# Patient Record
Sex: Female | Born: 1985
Health system: Southern US, Community
[De-identification: ages and names within clinical notes are randomized; demographics above are authoritative.]

## PROBLEM LIST (undated history)

## (undated) DIAGNOSIS — Z1502 Genetic susceptibility to malignant neoplasm of ovary: Secondary | ICD-10-CM

## (undated) DIAGNOSIS — J45909 Unspecified asthma, uncomplicated: Secondary | ICD-10-CM

## (undated) DIAGNOSIS — M797 Fibromyalgia: Secondary | ICD-10-CM

## (undated) DIAGNOSIS — F32A Depression, unspecified: Secondary | ICD-10-CM

## (undated) DIAGNOSIS — F419 Anxiety disorder, unspecified: Secondary | ICD-10-CM

## (undated) DIAGNOSIS — Z1509 Genetic susceptibility to other malignant neoplasm: Secondary | ICD-10-CM

## (undated) DIAGNOSIS — F329 Major depressive disorder, single episode, unspecified: Secondary | ICD-10-CM

## (undated) DIAGNOSIS — J329 Chronic sinusitis, unspecified: Secondary | ICD-10-CM

## (undated) DIAGNOSIS — Z1501 Genetic susceptibility to malignant neoplasm of breast: Secondary | ICD-10-CM

## (undated) DIAGNOSIS — Z803 Family history of malignant neoplasm of breast: Secondary | ICD-10-CM

## (undated) DIAGNOSIS — G43909 Migraine, unspecified, not intractable, without status migrainosus: Secondary | ICD-10-CM

## (undated) DIAGNOSIS — E282 Polycystic ovarian syndrome: Secondary | ICD-10-CM

## (undated) DIAGNOSIS — O24419 Gestational diabetes mellitus in pregnancy, unspecified control: Secondary | ICD-10-CM

## (undated) HISTORY — DX: Unspecified asthma, uncomplicated: J45.909

## (undated) HISTORY — DX: Genetic susceptibility to malignant neoplasm of breast: Z15.02

## (undated) HISTORY — DX: Genetic susceptibility to malignant neoplasm of breast: Z15.09

## (undated) HISTORY — DX: Family history of malignant neoplasm of breast: Z80.3

## (undated) HISTORY — PX: WISDOM TOOTH EXTRACTION: SHX21

## (undated) HISTORY — DX: Genetic susceptibility to malignant neoplasm of breast: Z15.01

## (undated) HISTORY — DX: Gestational diabetes mellitus in pregnancy, unspecified control: O24.419

---
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
## 2004-02-15 ENCOUNTER — Emergency Department: Payer: Self-pay | Admitting: Emergency Medicine

## 2004-06-03 ENCOUNTER — Ambulatory Visit: Payer: Self-pay | Admitting: Internal Medicine

## 2004-07-02 ENCOUNTER — Ambulatory Visit: Payer: Self-pay | Admitting: Internal Medicine

## 2004-11-17 ENCOUNTER — Ambulatory Visit: Payer: Self-pay | Admitting: Internal Medicine

## 2004-12-02 ENCOUNTER — Ambulatory Visit: Payer: Self-pay | Admitting: Internal Medicine

## 2005-03-25 ENCOUNTER — Emergency Department (HOSPITAL_COMMUNITY): Admission: EM | Admit: 2005-03-25 | Discharge: 2005-03-25 | Payer: Self-pay | Admitting: Emergency Medicine

## 2005-07-01 ENCOUNTER — Ambulatory Visit: Payer: Self-pay | Admitting: Internal Medicine

## 2005-08-10 ENCOUNTER — Ambulatory Visit: Payer: Self-pay | Admitting: Internal Medicine

## 2007-05-04 ENCOUNTER — Ambulatory Visit: Payer: Self-pay | Admitting: Internal Medicine

## 2007-05-04 LAB — CONVERTED CEMR LAB
Bilirubin Urine: NEGATIVE
Blood in Urine, dipstick: NEGATIVE
Glucose, Urine, Semiquant: NEGATIVE
Ketones, urine, test strip: NEGATIVE
Protein, U semiquant: NEGATIVE
Urobilinogen, UA: NEGATIVE
pH: 7.5

## 2007-05-05 ENCOUNTER — Encounter: Payer: Self-pay | Admitting: Internal Medicine

## 2007-05-08 ENCOUNTER — Encounter (INDEPENDENT_AMBULATORY_CARE_PROVIDER_SITE_OTHER): Payer: Self-pay | Admitting: *Deleted

## 2007-07-06 ENCOUNTER — Ambulatory Visit: Payer: Self-pay | Admitting: Internal Medicine

## 2007-07-06 DIAGNOSIS — J309 Allergic rhinitis, unspecified: Secondary | ICD-10-CM | POA: Insufficient documentation

## 2007-07-06 DIAGNOSIS — F411 Generalized anxiety disorder: Secondary | ICD-10-CM

## 2007-07-06 DIAGNOSIS — F419 Anxiety disorder, unspecified: Secondary | ICD-10-CM | POA: Insufficient documentation

## 2007-07-25 ENCOUNTER — Encounter: Payer: Self-pay | Admitting: Internal Medicine

## 2007-08-17 ENCOUNTER — Encounter: Payer: Self-pay | Admitting: Internal Medicine

## 2007-10-09 ENCOUNTER — Ambulatory Visit (HOSPITAL_COMMUNITY): Admission: RE | Admit: 2007-10-09 | Discharge: 2007-10-09 | Payer: Self-pay | Admitting: Obstetrics & Gynecology

## 2007-12-19 ENCOUNTER — Ambulatory Visit: Payer: Self-pay | Admitting: Internal Medicine

## 2007-12-19 DIAGNOSIS — G43909 Migraine, unspecified, not intractable, without status migrainosus: Secondary | ICD-10-CM | POA: Insufficient documentation

## 2008-01-17 ENCOUNTER — Encounter: Payer: Self-pay | Admitting: Internal Medicine

## 2008-02-26 ENCOUNTER — Ambulatory Visit: Payer: Self-pay | Admitting: Internal Medicine

## 2008-04-09 ENCOUNTER — Telehealth (INDEPENDENT_AMBULATORY_CARE_PROVIDER_SITE_OTHER): Payer: Self-pay | Admitting: *Deleted

## 2008-04-09 ENCOUNTER — Ambulatory Visit: Payer: Self-pay | Admitting: Internal Medicine

## 2008-04-11 LAB — CONVERTED CEMR LAB
Free T4: 0.7 ng/dL (ref 0.6–1.6)
TSH: 1.96 microintl units/mL (ref 0.35–5.50)

## 2008-04-14 ENCOUNTER — Encounter (INDEPENDENT_AMBULATORY_CARE_PROVIDER_SITE_OTHER): Payer: Self-pay | Admitting: *Deleted

## 2008-04-14 ENCOUNTER — Telehealth (INDEPENDENT_AMBULATORY_CARE_PROVIDER_SITE_OTHER): Payer: Self-pay | Admitting: *Deleted

## 2008-06-20 ENCOUNTER — Ambulatory Visit: Payer: Self-pay | Admitting: Vascular Surgery

## 2008-06-20 ENCOUNTER — Encounter (INDEPENDENT_AMBULATORY_CARE_PROVIDER_SITE_OTHER): Payer: Self-pay | Admitting: Rheumatology

## 2008-06-20 ENCOUNTER — Encounter: Payer: Self-pay | Admitting: Internal Medicine

## 2008-06-20 ENCOUNTER — Ambulatory Visit: Admission: RE | Admit: 2008-06-20 | Discharge: 2008-06-20 | Payer: Self-pay | Admitting: Rheumatology

## 2008-06-25 ENCOUNTER — Ambulatory Visit (HOSPITAL_COMMUNITY): Admission: RE | Admit: 2008-06-25 | Discharge: 2008-06-25 | Payer: Self-pay | Admitting: Interventional Radiology

## 2008-06-28 ENCOUNTER — Ambulatory Visit (HOSPITAL_COMMUNITY): Admission: RE | Admit: 2008-06-28 | Discharge: 2008-06-28 | Payer: Self-pay | Admitting: Orthopedic Surgery

## 2008-08-01 ENCOUNTER — Encounter: Payer: Self-pay | Admitting: Internal Medicine

## 2008-08-06 ENCOUNTER — Telehealth (INDEPENDENT_AMBULATORY_CARE_PROVIDER_SITE_OTHER): Payer: Self-pay | Admitting: *Deleted

## 2008-08-08 ENCOUNTER — Telehealth: Payer: Self-pay | Admitting: Internal Medicine

## 2008-08-12 ENCOUNTER — Encounter: Payer: Self-pay | Admitting: Internal Medicine

## 2008-08-13 ENCOUNTER — Encounter: Payer: Self-pay | Admitting: Internal Medicine

## 2008-08-20 ENCOUNTER — Telehealth (INDEPENDENT_AMBULATORY_CARE_PROVIDER_SITE_OTHER): Payer: Self-pay | Admitting: *Deleted

## 2008-12-10 ENCOUNTER — Ambulatory Visit (HOSPITAL_COMMUNITY): Admission: RE | Admit: 2008-12-10 | Discharge: 2008-12-10 | Payer: Self-pay | Admitting: Obstetrics & Gynecology

## 2009-01-07 ENCOUNTER — Ambulatory Visit (HOSPITAL_COMMUNITY): Admission: RE | Admit: 2009-01-07 | Discharge: 2009-01-07 | Payer: Self-pay | Admitting: Obstetrics & Gynecology

## 2009-01-22 ENCOUNTER — Ambulatory Visit (HOSPITAL_COMMUNITY): Admission: RE | Admit: 2009-01-22 | Discharge: 2009-01-22 | Payer: Self-pay | Admitting: Obstetrics & Gynecology

## 2009-02-09 ENCOUNTER — Encounter (INDEPENDENT_AMBULATORY_CARE_PROVIDER_SITE_OTHER): Payer: Self-pay | Admitting: *Deleted

## 2009-02-09 ENCOUNTER — Ambulatory Visit: Payer: Self-pay | Admitting: Internal Medicine

## 2009-04-09 ENCOUNTER — Ambulatory Visit (HOSPITAL_COMMUNITY): Admission: RE | Admit: 2009-04-09 | Discharge: 2009-04-09 | Payer: Self-pay | Admitting: Obstetrics & Gynecology

## 2009-04-09 ENCOUNTER — Encounter: Admission: RE | Admit: 2009-04-09 | Discharge: 2009-04-09 | Payer: Self-pay | Admitting: Internal Medicine

## 2009-04-20 ENCOUNTER — Ambulatory Visit (HOSPITAL_COMMUNITY): Admission: RE | Admit: 2009-04-20 | Discharge: 2009-04-20 | Payer: Self-pay | Admitting: Obstetrics & Gynecology

## 2009-05-27 ENCOUNTER — Ambulatory Visit (HOSPITAL_COMMUNITY): Admission: RE | Admit: 2009-05-27 | Discharge: 2009-05-27 | Payer: Self-pay | Admitting: Obstetrics & Gynecology

## 2009-06-14 ENCOUNTER — Inpatient Hospital Stay (HOSPITAL_COMMUNITY): Admission: AD | Admit: 2009-06-14 | Discharge: 2009-06-16 | Payer: Self-pay | Admitting: Obstetrics & Gynecology

## 2009-08-07 ENCOUNTER — Encounter: Payer: Self-pay | Admitting: Internal Medicine

## 2009-11-11 ENCOUNTER — Ambulatory Visit (HOSPITAL_COMMUNITY): Admission: RE | Admit: 2009-11-11 | Discharge: 2009-11-11 | Payer: Self-pay | Admitting: Obstetrics & Gynecology

## 2009-12-02 ENCOUNTER — Ambulatory Visit (HOSPITAL_COMMUNITY): Admission: RE | Admit: 2009-12-02 | Discharge: 2009-12-02 | Payer: Self-pay | Admitting: Obstetrics & Gynecology

## 2009-12-16 ENCOUNTER — Ambulatory Visit (HOSPITAL_COMMUNITY): Admission: RE | Admit: 2009-12-16 | Discharge: 2009-12-16 | Payer: Self-pay | Admitting: Obstetrics & Gynecology

## 2010-01-21 ENCOUNTER — Encounter: Payer: Self-pay | Admitting: Internal Medicine

## 2010-01-25 ENCOUNTER — Telehealth: Payer: Self-pay | Admitting: Internal Medicine

## 2010-01-27 ENCOUNTER — Ambulatory Visit: Payer: Self-pay | Admitting: Internal Medicine

## 2010-01-27 DIAGNOSIS — R002 Palpitations: Secondary | ICD-10-CM | POA: Insufficient documentation

## 2010-02-09 ENCOUNTER — Ambulatory Visit (HOSPITAL_COMMUNITY)
Admission: RE | Admit: 2010-02-09 | Discharge: 2010-02-09 | Payer: Self-pay | Source: Home / Self Care | Attending: Obstetrics & Gynecology | Admitting: Obstetrics & Gynecology

## 2010-03-26 ENCOUNTER — Ambulatory Visit (HOSPITAL_COMMUNITY)
Admission: RE | Admit: 2010-03-26 | Discharge: 2010-03-26 | Payer: Self-pay | Source: Home / Self Care | Attending: Obstetrics & Gynecology | Admitting: Obstetrics & Gynecology

## 2010-03-27 ENCOUNTER — Other Ambulatory Visit (HOSPITAL_COMMUNITY): Payer: Self-pay | Admitting: *Deleted

## 2010-03-27 DIAGNOSIS — Z0489 Encounter for examination and observation for other specified reasons: Secondary | ICD-10-CM

## 2010-03-29 ENCOUNTER — Encounter: Payer: Self-pay | Admitting: Obstetrics & Gynecology

## 2010-04-06 NOTE — Assessment & Plan Note (Signed)
Summary: HEART PALPITATIONS/PT IS PREGNANT/KB   Vital Signs:  Patient profile:   25 year old female Height:      62 inches Weight:      154.4 pounds BMI:     28.34 O2 Sat:      95 % Temp:     98.8 degrees F oral Pulse rate:   88 / minute Resp:     15 per minute BP sitting:   110 / 68  (left arm) Cuff size:   large  Vitals Entered By: Shonna Chock CMA (January 27, 2010 4:22 PM) CC: Heart palpitations daily x 2 weeks except yesterday they finally stopped , Palpitations   CC:  Heart palpitations daily x 2 weeks except yesterday they finally stopped  and Palpitations.  History of Present Illness: Palpitations      This is a 25 year old woman who presents with Palpitations x 2 weeks, initially only @ night.  The patient denies dizziness, syncope, chest pain, shortness of breath, and throat tightness.  The patient denies the following symptoms: blurred vision, numbness, weakness, diaphoresis, and nausea.  The palpitations are described as a sensation of the heart beating strongly.  The palpitations are sudden in onset, intermittent, occur daily, occur at rest & never with  exercise. For past week these now  occur during the day as well  as   at night.  The palpitations  have no trigger. She drinks , 12 of of caffeinated soda / day.EKG 11/17 : WNL.HCT 35.7;glucose 100. Free T4 ; TSH ; Ca++& K+ are all  WNL.  Current Medications (verified): 1)  Prenatal Multivit-Iron  Tabs (Prenatal Vit-Fe Sulfate-Fa) .Marland Kitchen.. 1 By Mouth Once Daily 2)  Claritin 10 Mg Tabs (Loratadine) .Marland Kitchen.. 1 By Mouth Once Daily 3)  Verapamil Hcl Cr 240 Mg Cr-Tabs (Verapamil Hcl) .Marland Kitchen.. 1 By Mouth Once Daily 4)  Calcium-Vitamin D 500-200 Mg-Unit Tabs (Calcium-Vitamin D) .Marland Kitchen.. 1 By Mouth Once Daily 5)  Glyburide 2.5 Mg Tabs (Glyburide) .Marland Kitchen.. 1 By Mouth At Bedtime  Allergies (verified): No Known Drug Allergies  Past History:  Past Medical History:   ANXIETY   ALLERGIC RHINITIS   Headache,-migraine Endometriosis Gestational   Diabetes  Past Surgical History: G 1 P 1; [redacted] weeks pregnant  Review of Systems GI:  Complains of indigestion; denies gas, loss of appetite, and nausea; She took meds with last pregnancy  from Dr Tamela Oddi. Psych:  Denies anxiety, easily tearful, irritability, and mental problems.  Physical Exam  General:  well-nourished,in no acute distress; alert,appropriate and cooperative throughout examination Eyes:  No corneal or conjunctival inflammation noted.  Perrla. No lid lag Neck:  No deformities, masses, or tenderness noted. Thyroid fullw/o nodules Lungs:  Normal respiratory effort, chest expands symmetrically. Lungs are clear to auscultation, no crackles or wheezes. Heart:  normal rate, regular rhythm, no gallop, no rub, no JVD, no HJR, and grade 1 /6 systolic murmur.   Abdomen:  Bowel sounds positive,abdomen soft and non-tender without  organomegaly or hernias noted. IUP  5 cm above umbilicus Pulses:  R and L carotid,radial,dorsalis pedis and posterior tibial pulses are full and equal bilaterally Extremities:  No clubbing, cyanosis, edema. No onycholysis Neurologic:  alert & oriented X3 and DTRs symmetrical and normal.  No tremor Skin:  Intact without suspicious lesions or rashes Cervical Nodes:  No lymphadenopathy noted Axillary Nodes:  No palpable lymphadenopathy Psych:  memory intact for recent and remote, normally interactive, good eye contact, and not anxious appearing.  Impression & Recommendations:  Problem # 1:  PALPITATIONS (ICD-785.1) non exertional; probable triggers: Hiatal Hernia, ERD, caffeine  Problem # 2:  INTRAUTERINE PREGNANCY (ICD-V22.2) 23 weeks  Complete Medication List: 1)  Prenatal Multivit-iron Tabs (Prenatal vit-fe sulfate-fa) .Marland Kitchen.. 1 by mouth once daily 2)  Claritin 10 Mg Tabs (Loratadine) .Marland Kitchen.. 1 by mouth once daily 3)  Verapamil Hcl Cr 240 Mg Cr-tabs (Verapamil hcl) .Marland Kitchen.. 1 by mouth once daily 4)  Calcium-vitamin D 500-200 Mg-unit Tabs  (Calcium-vitamin d) .Marland Kitchen.. 1 by mouth once daily 5)  Glyburide 2.5 Mg Tabs (Glyburide) .Marland Kitchen.. 1 by mouth at bedtime  Patient Instructions: 1)  Avoid all  stimulants as discussed. 2)  Avoid foods high in acid (tomatoes, citrus juices, spicy foods). Avoid eating within two hours of lying down or before exercising. Do not over eat; try smaller more frequent meals. Elevate head of bed twelve inches when sleeping if needed. Ask Dr Jean RosenthalChristell Constant for med for indigestion.  If palpitations persist please have magnesium level  drawn to complement the thorough evaluation to date.   Orders Added: 1)  Est. Patient Level IV [16109]

## 2010-04-06 NOTE — Letter (Signed)
Summary: Prenatal Flow Sheet/[facility]  Prenatal Flow Sheet/[facility]   Imported By: Lanelle Bal 02/05/2010 10:09:39  _____________________________________________________________________  External Attachment:    Type:   Image     Comment:   External Document

## 2010-04-06 NOTE — Letter (Signed)
Summary: [facility]  [facility]   Imported By: Lanelle Bal 08/21/2009 11:09:47  _____________________________________________________________________  External Attachment:    Type:   Image     Comment:   External Document

## 2010-04-06 NOTE — Progress Notes (Signed)
Summary: Heart Palpittations/Appt  Phone Note Other Incoming   Summary of Call: The office has received notes on the patient stating that she has been having heart palpitations. The patient currently has appt for 02-10-10. This does not need to wait, I called patient to schedule sooner appt. She declined appt for today stating that she had to work, patient was schedule for Wed. PM per her request. Initial call taken by: Lucious Groves CMA,  January 25, 2010 11:02 AM

## 2010-04-23 ENCOUNTER — Ambulatory Visit (HOSPITAL_COMMUNITY)
Admission: RE | Admit: 2010-04-23 | Discharge: 2010-04-23 | Disposition: A | Discharge status: Home / Self Care | Payer: 59 | Source: Ambulatory Visit | Attending: Obstetrics & Gynecology | Admitting: Obstetrics & Gynecology

## 2010-04-23 DIAGNOSIS — O99810 Abnormal glucose complicating pregnancy: Secondary | ICD-10-CM | POA: Insufficient documentation

## 2010-04-23 DIAGNOSIS — Z0489 Encounter for examination and observation for other specified reasons: Secondary | ICD-10-CM

## 2010-04-23 DIAGNOSIS — Z3689 Encounter for other specified antenatal screening: Secondary | ICD-10-CM | POA: Insufficient documentation

## 2010-05-04 ENCOUNTER — Inpatient Hospital Stay (HOSPITAL_COMMUNITY)
Admission: AD | Admit: 2010-05-04 | Discharge: 2010-05-05 | Disposition: A | Discharge status: Home / Self Care | Payer: 59 | Source: Ambulatory Visit | Attending: Obstetrics | Admitting: Obstetrics

## 2010-05-04 DIAGNOSIS — G43909 Migraine, unspecified, not intractable, without status migrainosus: Secondary | ICD-10-CM | POA: Insufficient documentation

## 2010-05-04 DIAGNOSIS — O99891 Other specified diseases and conditions complicating pregnancy: Secondary | ICD-10-CM | POA: Insufficient documentation

## 2010-05-04 DIAGNOSIS — O9989 Other specified diseases and conditions complicating pregnancy, childbirth and the puerperium: Secondary | ICD-10-CM

## 2010-05-10 ENCOUNTER — Inpatient Hospital Stay (HOSPITAL_COMMUNITY)
Admission: AD | Admit: 2010-05-10 | Discharge: 2010-05-15 | DRG: 775 | Disposition: A | Discharge status: Home / Self Care | Payer: 59 | Source: Ambulatory Visit | Attending: Obstetrics & Gynecology | Admitting: Obstetrics & Gynecology

## 2010-05-10 DIAGNOSIS — O99814 Abnormal glucose complicating childbirth: Principal | ICD-10-CM | POA: Diagnosis present

## 2010-05-10 DIAGNOSIS — O212 Late vomiting of pregnancy: Secondary | ICD-10-CM | POA: Diagnosis present

## 2010-05-10 LAB — COMPREHENSIVE METABOLIC PANEL
ALT: 17 U/L (ref 0–35)
Albumin: 2.4 g/dL — ABNORMAL LOW (ref 3.5–5.2)
Alkaline Phosphatase: 168 U/L — ABNORMAL HIGH (ref 39–117)
Calcium: 8.5 mg/dL (ref 8.4–10.5)
GFR calc Af Amer: >60 mL/min (ref >60)
Potassium: 3.7 mEq/L (ref 3.5–5.1)
Sodium: 140 mEq/L (ref 135–145)
Total Protein: 5.5 g/dL — ABNORMAL LOW (ref 6.0–8.3)

## 2010-05-10 LAB — CBC
MCHC: 32.9 g/dL (ref 30.0–36.0)
Platelets: 166 10*3/uL (ref 150–400)
RDW: 14.5 % (ref 11.5–15.5)
WBC: 7.9 10*3/uL (ref 4.0–10.5)

## 2010-05-10 LAB — GLUCOSE, CAPILLARY
Glucose-Capillary: 90 mg/dL (ref 70–99)
Glucose-Capillary: 140 mg/dL — ABNORMAL HIGH (ref 70–99)

## 2010-05-10 LAB — URIC ACID: Uric Acid, Serum: 6.1 mg/dL (ref 2.4–7.0)

## 2010-05-11 ENCOUNTER — Inpatient Hospital Stay (HOSPITAL_COMMUNITY): Payer: 59

## 2010-05-11 ENCOUNTER — Ambulatory Visit (HOSPITAL_COMMUNITY)
Admit: 2010-05-11 | Discharge: 2010-05-11 | Disposition: A | Discharge status: Home / Self Care | Payer: 59 | Attending: Obstetrics & Gynecology | Admitting: Obstetrics & Gynecology

## 2010-05-11 LAB — GLUCOSE, CAPILLARY: Glucose-Capillary: 119 mg/dL — ABNORMAL HIGH (ref 70–99)

## 2010-05-11 LAB — COMPREHENSIVE METABOLIC PANEL
AST: 28 U/L (ref 0–37)
Albumin: 2 g/dL — ABNORMAL LOW (ref 3.5–5.2)
Alkaline Phosphatase: 142 U/L — ABNORMAL HIGH (ref 39–117)
BUN: 2 mg/dL — ABNORMAL LOW (ref 6–23)
CO2: 24 mEq/L (ref 19–32)
Chloride: 108 mEq/L (ref 96–112)
Creatinine, Ser: 0.86 mg/dL (ref 0.4–1.2)
GFR calc Af Amer: >60 mL/min (ref >60)
GFR calc non Af Amer: >60 mL/min (ref >60)
Potassium: 3.5 mEq/L (ref 3.5–5.1)
Total Bilirubin: 0.5 mg/dL (ref 0.3–1.2)

## 2010-05-11 LAB — TSH: TSH: 1.208 u[IU]/mL (ref 0.350–4.500)

## 2010-05-11 NOTE — H&P (Addendum)
NAMERAMA, SORCI               ACCOUNT NO.:  [id number]  MEDICAL RECORD NO.:  [id number]           PATIENT TYPE:  I  LOCATION:  9151                          FACILITY:  [facility]  PHYSICIAN:  Roseanna Rainbow, M.D.DATE OF BIRTH:  10/30/1985  DATE OF ADMISSION:  05/10/2010 DATE OF DISCHARGE:                             HISTORY & PHYSICAL   CHIEF COMPLAINT:  The patient is a 25 year old gravida 2, para 1, with an estimated date of confinement of March 18, complaining of nausea and vomiting.  HISTORY OF PRESENT ILLNESS:  The patient has a history of mild nausea that resolved in the early second trimester of pregnancy.  Subsequent to that, she developed some GERD symptoms and treatments progressed up to a proton pump inhibitor.  At one point, she was taking the proton pump inhibitor twice a day.  Over the past several weeks, the patient has had worsening nausea and vomiting.  Initially, this was felt to be a viral gastroenteritis.  However, the symptoms persisted.  She was started on an antiemetic regimen.  The nausea and vomiting is refractory to the current antiemetic regimen.  Her p.o. intake is minimal.  There is a possible 5-pound weight loss in the last week, though this may be a spurious finding.  ALLERGIES:  No known drug allergies.  MEDICATIONS:  Glyburide, Protonix, prenatal vitamins, meclizine, Reglan.  OB RISK FACTORS:  Please see the above, gestational diabetes on an oral agent.  PAST OB HISTORY:  In April 2011, she delivered at term 6 pound 1 ounce female, vaginal delivery.  PRENATAL LABS:  Blood type is A+, antibody screen negative.  Urine culture and sensitivity, insignificant growth.  Average glucose 108 July 2011.  Free T4 0.97 November 2011.  GBS positive on February 20. Hepatitis B surface antigen negative.  Hematocrit 38.8, hemoglobin 12.5. HIV nonreactive.  H. pylori IgG negative January 2012.  Hemoglobin A1c 5.07 September 2009.  Platelets 201,000.  RPR  nonreactive.  Rubella immune. TSH 1.4 in November 2011.  Varicella immune.  Most recent ultrasound on February 17, breech presentation, normal amniotic fluid, no previa, estimated fetal weight at the 59th percentile for 35-week-5-day gestation.  Ultrasound on January 20, the estimated fetal weight was at the 53rd percentile for 31-week-5-day gestation.  An ultrasound on December 6 at 25 weeks and 2 days, the estimated fetal weight was at the 47th percentile.  TACE score of 0.  PAST GYN HISTORY:  Noncontributory.  PAST MEDICAL HISTORY: 1. Hypercholesterolemia. 2. Migraine headaches. 3. Gestational diabetes. 4. Fibromyalgia.  PAST SURGICAL HISTORY:  No previous surgery.  SOCIAL HISTORY:  She is a Lawyer.  She is married, living with her spouse. Does not give any significant history of alcohol usage.  Has no significant smoking history.  Denies illicit drug use.  FAMILY HISTORY:  Remarkable for adult-onset diabetes, hypertension.  REVIEW OF SYSTEMS:  GASTROINTESTINAL:  Please see the above.  PHYSICAL EXAMINATION:  VITAL SIGNS:  Blood pressure 127/84, weight 162 pounds.  Nonstress test reactive.  CBG, some lows, per the patient fasting with the recent decrease p.o. intake. GENERAL:  Anxious, tearful. ABDOMEN:  Gravid.  PELVIC EXAM:  Deferred.  LABORATORY DATA:  On March 1, potassium 3.2, albumin 3.3, uric acid 6.2.  ASSESSMENT: 1. Primipara at 38 weeks with nausea and vomiting, likely pregnancy     related. 2. Gestational diabetes, on oral agent. 3. Fetal testing consistent with fetal well being, appropriate for     gestational age fetus.  PLAN:  Admission, IV hydration, replete electrolytes as needed, labs, bedrest, antiemetics, Maternal Fetal Medicine consultation.     Roseanna Rainbow, M.D.     Judee Clara  D:  05/10/2010  T:  05/11/2010  Job:  [id number]  Electronically Signed by Antionette Char M.D. on 05/11/2010 07:43:20 PM

## 2010-05-12 LAB — GLUCOSE, CAPILLARY
Glucose-Capillary: 79 mg/dL (ref 70–99)
Glucose-Capillary: 105 mg/dL — ABNORMAL HIGH (ref 70–99)

## 2010-05-13 LAB — GLUCOSE, CAPILLARY
Glucose-Capillary: 132 mg/dL — ABNORMAL HIGH (ref 70–99)
Glucose-Capillary: 78 mg/dL (ref 70–99)
Glucose-Capillary: 86 mg/dL (ref 70–99)

## 2010-05-13 LAB — CBC
HCT: 35.5 % — ABNORMAL LOW (ref 36.0–46.0)
MCHC: 32.4 g/dL (ref 30.0–36.0)
Platelets: 138 10*3/uL — ABNORMAL LOW (ref 150–400)
RDW: 14.7 % (ref 11.5–15.5)
WBC: 8.3 10*3/uL (ref 4.0–10.5)

## 2010-05-14 LAB — CBC
MCH: 30.6 pg (ref 26.0–34.0)
MCHC: 32.6 g/dL (ref 30.0–36.0)
MCV: 94.1 fL (ref 78.0–100.0)
Platelets: 141 10*3/uL — ABNORMAL LOW (ref 150–400)
RBC: 3.72 MIL/uL — ABNORMAL LOW (ref 3.87–5.11)
RDW: 14.6 % (ref 11.5–15.5)

## 2010-05-14 LAB — GLUCOSE, CAPILLARY
Glucose-Capillary: 100 mg/dL — ABNORMAL HIGH (ref 70–99)
Glucose-Capillary: 96 mg/dL (ref 70–99)

## 2010-05-23 ENCOUNTER — Inpatient Hospital Stay (HOSPITAL_COMMUNITY): Admission: AD | Admit: 2010-05-23 | Payer: Self-pay | Admitting: Obstetrics & Gynecology

## 2010-05-26 LAB — CBC
HCT: 31.9 % — ABNORMAL LOW (ref 36.0–46.0)
Hemoglobin: 13.8 g/dL (ref 12.0–15.0)
MCHC: 34.3 g/dL (ref 30.0–36.0)
Platelets: 182 10*3/uL (ref 150–400)
RDW: 13.9 % (ref 11.5–15.5)
RDW: 13.9 % (ref 11.5–15.5)
WBC: 13.8 10*3/uL — ABNORMAL HIGH (ref 4.0–10.5)

## 2010-05-26 LAB — GLUCOSE, CAPILLARY
Glucose-Capillary: 118 mg/dL — ABNORMAL HIGH (ref 70–99)
Glucose-Capillary: 86 mg/dL (ref 70–99)

## 2010-05-26 LAB — RPR: RPR Ser Ql: NONREACTIVE

## 2010-11-26 ENCOUNTER — Inpatient Hospital Stay (INDEPENDENT_AMBULATORY_CARE_PROVIDER_SITE_OTHER)
Admission: RE | Admit: 2010-11-26 | Discharge: 2010-11-26 | Disposition: A | Discharge status: Home / Self Care | Payer: 59 | Source: Ambulatory Visit | Attending: Family Medicine | Admitting: Family Medicine

## 2010-11-26 DIAGNOSIS — L989 Disorder of the skin and subcutaneous tissue, unspecified: Secondary | ICD-10-CM

## 2010-11-28 ENCOUNTER — Inpatient Hospital Stay (INDEPENDENT_AMBULATORY_CARE_PROVIDER_SITE_OTHER)
Admission: RE | Admit: 2010-11-28 | Discharge: 2010-11-28 | Disposition: A | Discharge status: Home / Self Care | Payer: 59 | Source: Ambulatory Visit | Attending: Family Medicine | Admitting: Family Medicine

## 2010-11-28 DIAGNOSIS — L52 Erythema nodosum: Secondary | ICD-10-CM

## 2010-11-29 LAB — HERPES SIMPLEX VIRUS CULTURE: Culture: NOT DETECTED

## 2010-12-22 ENCOUNTER — Ambulatory Visit (HOSPITAL_COMMUNITY)
Admission: RE | Admit: 2010-12-22 | Discharge: 2010-12-22 | Disposition: A | Discharge status: Home / Self Care | Payer: 59 | Source: Ambulatory Visit | Attending: Rheumatology | Admitting: Rheumatology

## 2010-12-22 ENCOUNTER — Other Ambulatory Visit: Payer: Self-pay | Admitting: Rheumatology

## 2010-12-22 DIAGNOSIS — Z0189 Encounter for other specified special examinations: Secondary | ICD-10-CM | POA: Insufficient documentation

## 2011-10-20 ENCOUNTER — Other Ambulatory Visit: Payer: Self-pay | Admitting: Obstetrics & Gynecology

## 2011-10-20 DIAGNOSIS — R1032 Left lower quadrant pain: Secondary | ICD-10-CM

## 2011-10-21 ENCOUNTER — Ambulatory Visit (HOSPITAL_COMMUNITY)
Admission: RE | Admit: 2011-10-21 | Discharge: 2011-10-21 | Disposition: A | Discharge status: Home / Self Care | Payer: 59 | Source: Ambulatory Visit | Attending: Obstetrics & Gynecology | Admitting: Obstetrics & Gynecology

## 2011-10-21 DIAGNOSIS — N831 Corpus luteum cyst of ovary, unspecified side: Secondary | ICD-10-CM | POA: Insufficient documentation

## 2011-10-21 DIAGNOSIS — R1032 Left lower quadrant pain: Secondary | ICD-10-CM | POA: Insufficient documentation

## 2011-10-21 DIAGNOSIS — Z30431 Encounter for routine checking of intrauterine contraceptive device: Secondary | ICD-10-CM | POA: Insufficient documentation

## 2012-03-09 ENCOUNTER — Ambulatory Visit (INDEPENDENT_AMBULATORY_CARE_PROVIDER_SITE_OTHER): Payer: 59 | Admitting: Family Medicine

## 2012-03-09 VITALS — BP 90/62 | HR 92 | Temp 98.3°F | Wt 143.0 lb

## 2012-03-09 DIAGNOSIS — J329 Chronic sinusitis, unspecified: Secondary | ICD-10-CM | POA: Insufficient documentation

## 2012-03-09 MED ORDER — GUAIFENESIN-CODEINE 100-10 MG/5ML PO SYRP: 10.0000 mL | ORAL_SOLUTION | Freq: Three times a day (TID) | ORAL | Status: DC | PRN

## 2012-03-09 MED ORDER — DOXYCYCLINE HYCLATE 100 MG PO TABS: 100.0000 mg | ORAL_TABLET | Freq: Two times a day (BID) | ORAL | Status: DC

## 2012-03-09 NOTE — Progress Notes (Signed)
  Subjective:    Patient ID: Michele Mcbride, female    DOB: 1985-08-18, 27 y.o.   MRN: [id number]  HPI URI- sxs started 3 days ago.  Both children are sick.  + hoarseness, productive cough, facial pain/pressure.  + HA.  + sore throat, PND.  No fevers.  No N/V/D.     Review of Systems For ROS see HPI     Objective:   Physical Exam  Vitals reviewed. Constitutional: She appears well-developed and well-nourished. No distress.  HENT:  Head: Normocephalic and atraumatic.  Right Ear: Tympanic membrane normal.  Left Ear: Tympanic membrane normal.  Nose: Mucosal edema and rhinorrhea present. Right sinus exhibits maxillary sinus tenderness and frontal sinus tenderness. Left sinus exhibits maxillary sinus tenderness and frontal sinus tenderness.  Mouth/Throat: Uvula is midline and mucous membranes are normal. Posterior oropharyngeal erythema present. No oropharyngeal exudate.  Eyes: Conjunctivae normal and EOM are normal. Pupils are equal, round, and reactive to light.  Neck: Normal range of motion. Neck supple.  Cardiovascular: Normal rate, regular rhythm and normal heart sounds.   Pulmonary/Chest: Effort normal and breath sounds normal. No respiratory distress. She has no wheezes.  Lymphadenopathy:    She has no cervical adenopathy.          Assessment & Plan:

## 2012-03-09 NOTE — Assessment & Plan Note (Signed)
New.  Start abx.  Cough meds prn.  Reviewed supportive care and red flags that should prompt return.  Pt expressed understanding and is in agreement w/ plan.  

## 2012-03-09 NOTE — Patient Instructions (Addendum)
This is a sinus infection Start the Doxy twice daily- take w/ food Drink plenty of fluids REST!!! Use the cough syrup as needed- will cause drowsiness Mucinex DM for daytime cough Call with any questions or concerns Hang in there!!!

## 2012-03-10 ENCOUNTER — Encounter (HOSPITAL_COMMUNITY): Payer: Self-pay | Admitting: Emergency Medicine

## 2012-03-10 ENCOUNTER — Emergency Department (HOSPITAL_COMMUNITY)
Admission: EM | Admit: 2012-03-10 | Discharge: 2012-03-10 | Disposition: A | Discharge status: Home / Self Care | Payer: 59 | Attending: Emergency Medicine | Admitting: Emergency Medicine

## 2012-03-10 DIAGNOSIS — G43909 Migraine, unspecified, not intractable, without status migrainosus: Secondary | ICD-10-CM | POA: Insufficient documentation

## 2012-03-10 DIAGNOSIS — R51 Headache: Secondary | ICD-10-CM

## 2012-03-10 DIAGNOSIS — Z8739 Personal history of other diseases of the musculoskeletal system and connective tissue: Secondary | ICD-10-CM | POA: Insufficient documentation

## 2012-03-10 DIAGNOSIS — Z79899 Other long term (current) drug therapy: Secondary | ICD-10-CM | POA: Insufficient documentation

## 2012-03-10 DIAGNOSIS — Z8742 Personal history of other diseases of the female genital tract: Secondary | ICD-10-CM | POA: Insufficient documentation

## 2012-03-10 DIAGNOSIS — Z8619 Personal history of other infectious and parasitic diseases: Secondary | ICD-10-CM | POA: Insufficient documentation

## 2012-03-10 HISTORY — DX: Migraine, unspecified, not intractable, without status migrainosus: G43.909

## 2012-03-10 HISTORY — DX: Fibromyalgia: M79.7

## 2012-03-10 HISTORY — DX: Chronic sinusitis, unspecified: J32.9

## 2012-03-10 HISTORY — DX: Polycystic ovarian syndrome: E28.2

## 2012-03-10 MED ORDER — HYDROCODONE-ACETAMINOPHEN 5-325 MG PO TABS: 1.0000 | ORAL_TABLET | ORAL | Status: DC | PRN

## 2012-03-10 MED ORDER — METOCLOPRAMIDE HCL 5 MG/ML IJ SOLN
10.0000 mg | Freq: Once | INTRAMUSCULAR | Status: AC
  Administered 2012-03-10: 10 mg via INTRAVENOUS
  Filled 2012-03-10: qty 2

## 2012-03-10 MED ORDER — KETOROLAC TROMETHAMINE 30 MG/ML IJ SOLN
30.0000 mg | Freq: Once | INTRAMUSCULAR | Status: AC
  Administered 2012-03-10: 30 mg via INTRAVENOUS
  Filled 2012-03-10: qty 1

## 2012-03-10 MED ORDER — MORPHINE SULFATE 4 MG/ML IJ SOLN
6.0000 mg | Freq: Once | INTRAMUSCULAR | Status: AC
  Administered 2012-03-10: 6 mg via INTRAVENOUS
  Filled 2012-03-10: qty 2

## 2012-03-10 NOTE — ED Provider Notes (Signed)
History     CSN: [id number]  Arrival date & time 03/10/12  0303   First MD Initiated Contact with Patient 03/10/12 [phone]      Chief Complaint  Patient presents with  . Migraine    The history is provided by the patient.   patient developed ongoing persistent headache today.  She has a history of migraine headaches and states that Imitrex has not helped her headache.  Her headache is generalized her entire head.  No recent fall or trauma.  She is not on anticoagulants.  She's currently being managed symptomatically for a sinus infection with antibiotics by her primary care physician.  She reports trying Imitrex and no other medications for her headache.  Her headache is mild to moderate in severity.  The patient has a history of fibromyalgia per the medical records.  Nausea without vomiting.  No diarrhea.  Mild decreased oral intake today.  Nothing improves her symptoms.  Her symptoms are worsened by light and loud noise.   Past Medical History  Diagnosis Date  . Sinus infection   . Migraine   . Fibromyalgia   . PCOS (polycystic ovarian syndrome)     History reviewed. No pertinent past surgical history.  No family history on file.  History  Substance Use Topics  . Smoking status: Never Smoker   . Smokeless tobacco: Not on file  . Alcohol Use: No    OB History    Grav Para Term Preterm Abortions TAB SAB Ect Mult Living                  Review of Systems  All other systems reviewed and are negative.    Allergies  Review of patient's allergies indicates no known allergies.  Home Medications   Current Outpatient Rx  Name  Route  Sig  Dispense  Refill  . DOXYCYCLINE HYCLATE 100 MG PO TABS   Oral   Take 1 tablet (100 mg total) by mouth 2 (two) times daily.   20 tablet   0   . DULOXETINE HCL 60 MG PO CPEP   Oral   Take 60 mg by mouth daily.         . GUAIFENESIN-CODEINE 100-10 MG/5ML PO SYRP   Oral   Take 10 mLs by mouth 3 (three) times daily as needed for  cough.   240 mL   0   . SPIRONOLACTONE 100 MG PO TABS   Oral   Take 100 mg by mouth daily.         . SUMATRIPTAN SUCCINATE 100 MG PO TABS   Oral   Take 100 mg by mouth every 2 (two) hours as needed.         . TOPIRAMATE 200 MG PO TABS   Oral   Take 200 mg by mouth 2 (two) times daily.         Marland Kitchen ZOLPIDEM TARTRATE 10 MG PO TABS   Oral   Take 10 mg by mouth at bedtime as needed. For sleep         . HYDROCODONE-ACETAMINOPHEN 5-325 MG PO TABS   Oral   Take 1 tablet by mouth every 4 (four) hours as needed for pain.   8 tablet   0     BP 105/75  Pulse 117  Temp 97.8 F (36.6 C) (Oral)  Resp 23  SpO2 97%  Physical Exam  Nursing note and vitals reviewed. Constitutional: She is oriented to person, place, and time. She appears  well-developed and well-nourished. No distress.  HENT:  Head: Normocephalic and atraumatic.  Eyes: EOM are normal. Pupils are equal, round, and reactive to light.  Neck: Normal range of motion.  Cardiovascular: Normal rate, regular rhythm and normal heart sounds.   Pulmonary/Chest: Effort normal and breath sounds normal.  Abdominal: Soft. She exhibits no distension. There is no tenderness.  Musculoskeletal: Normal range of motion.  Neurological: She is alert and oriented to person, place, and time.       5/5 strength in major muscle groups of  bilateral upper and lower extremities. Speech normal. No facial asymetry.   Skin: Skin is warm and dry.  Psychiatric: She has a normal mood and affect. Judgment normal.    ED Course  Procedures (including critical care time)  Labs Reviewed - No data to display No results found.   1. Headache       MDM  Typical migraine headache for the pt. Non focal neuro exam. No recent head trauma. No fever. Doubt meningitis. Doubt intracranial bleed. Doubt normal pressure hydrocephalus. No indication for imaging. Will treat with migraine cocktail and reevaluate  5:09 AM The patient is feeling better at  this time.  Discharge home in good condition.        Lyanne Co, MD 03/10/12 [phone]

## 2012-03-10 NOTE — ED Notes (Signed)
Pt dc to home.  Pt states understanding to dc paperwork.   

## 2012-03-10 NOTE — ED Notes (Signed)
Pt ambulatory to exit without difficulty.  Pt denies need for w/c.

## 2012-03-10 NOTE — ED Notes (Signed)
C/o migraine since Friday morning with nausea.  Pt states she is currently taking antibiotics for a sinus infection.

## 2012-06-01 IMAGING — US US OB FOLLOW-UP
1 series · 14 of 28 positions shown · non-contrast
Comparison: none

[Series 1: us ob follow-up · 14 of 32 slices shown]
[im 2/32]
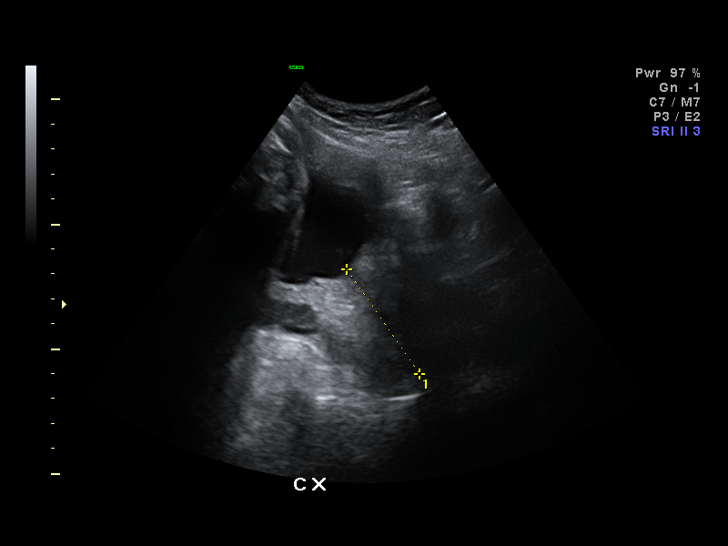
[im 4/32]
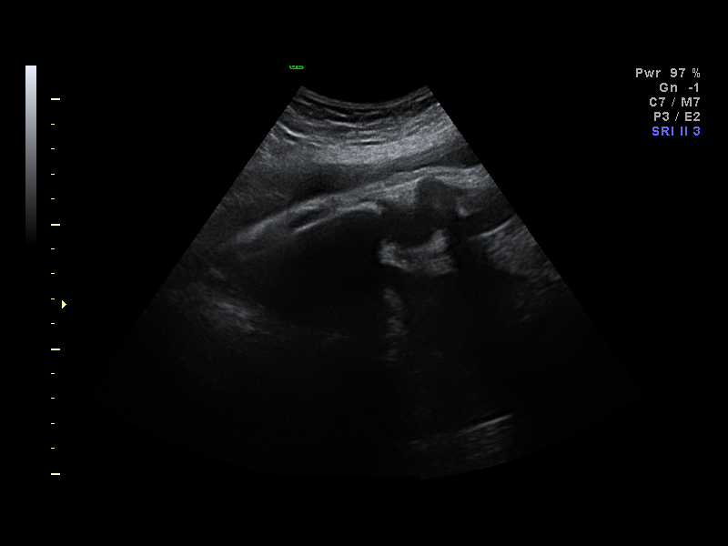
[im 6/32]
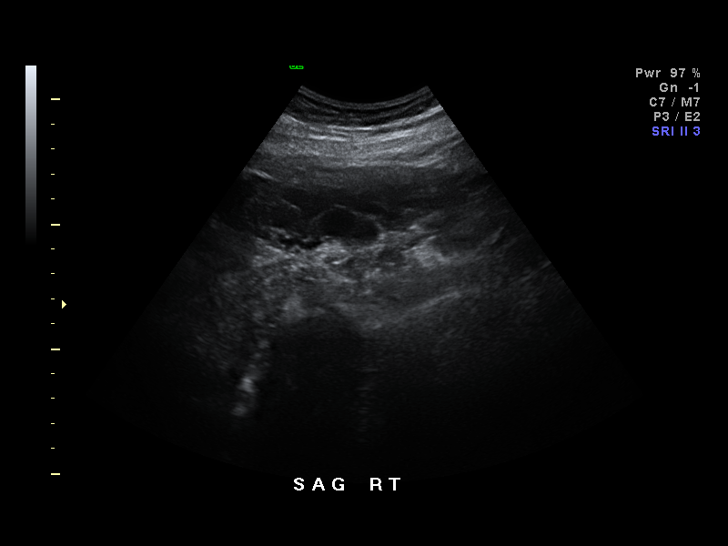
[im 9/32]
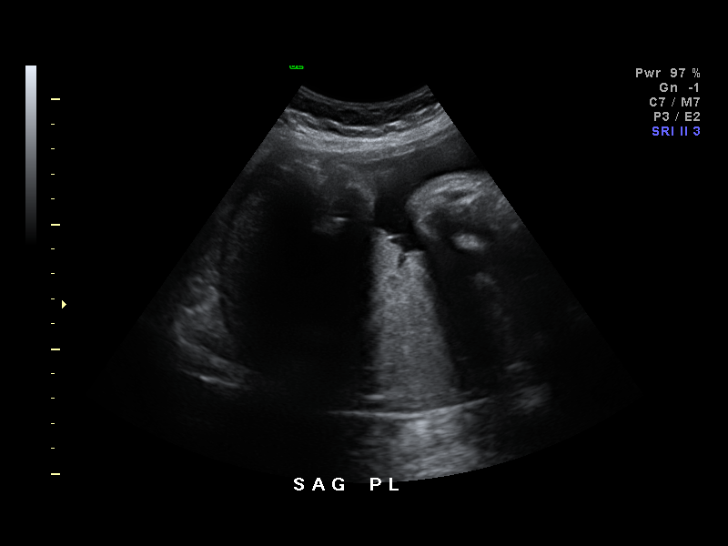
[im 11/32]
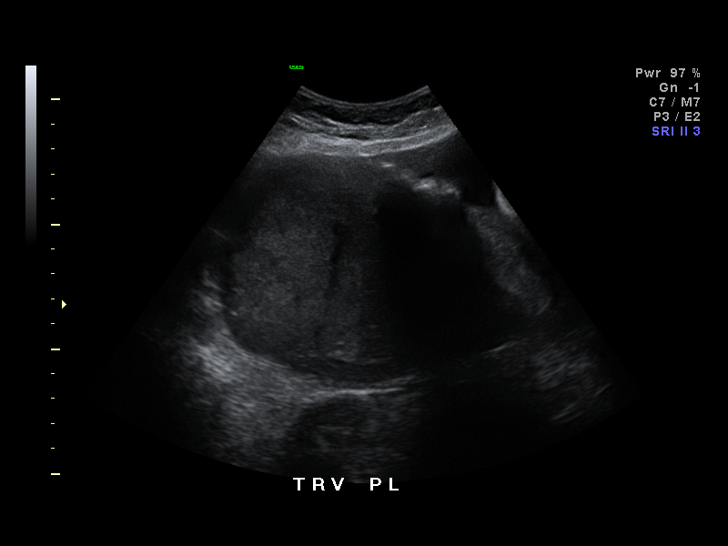
[im 13/32]
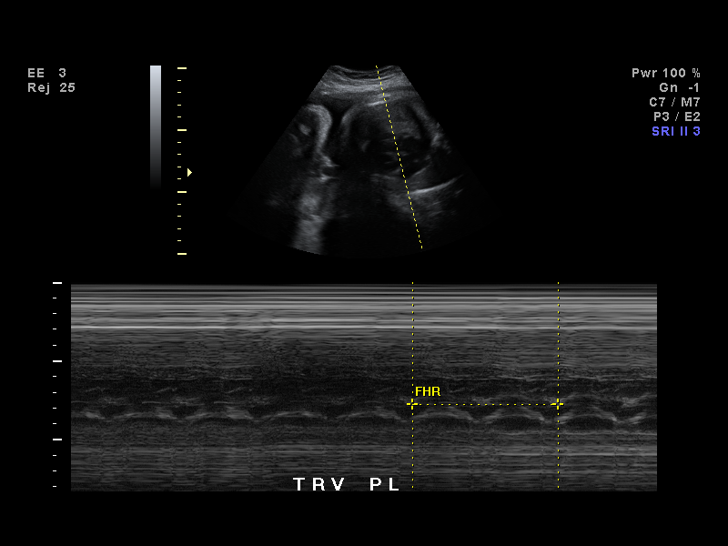
[im 15/32]
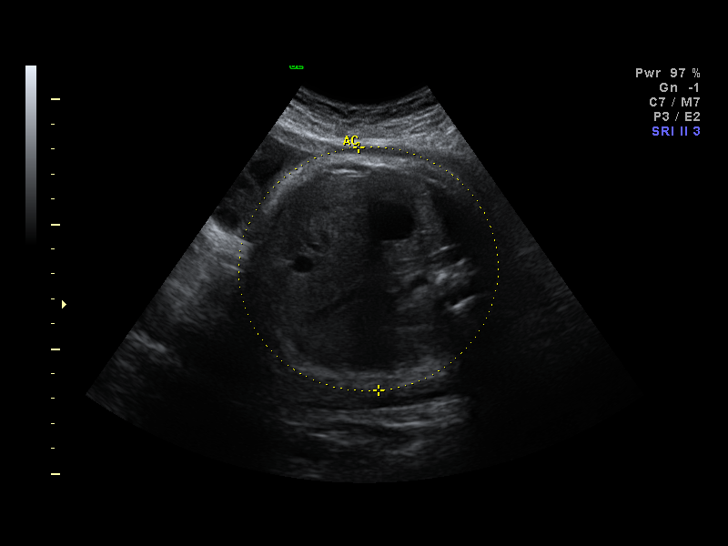
[im 18/32]
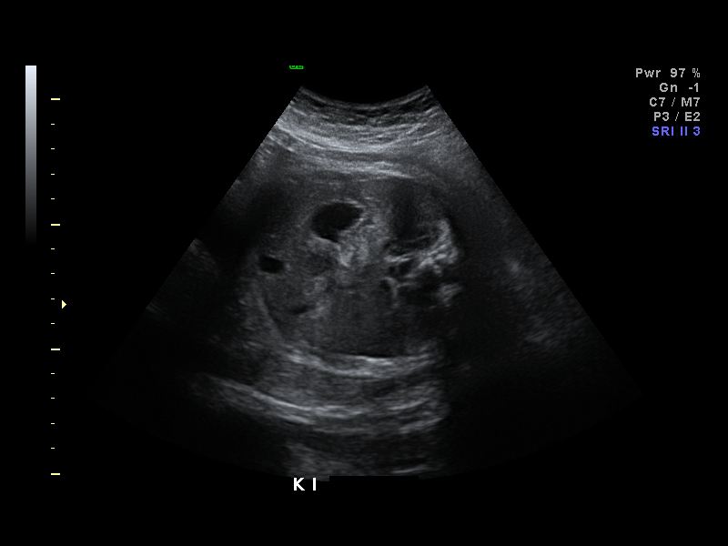
[im 20/32]
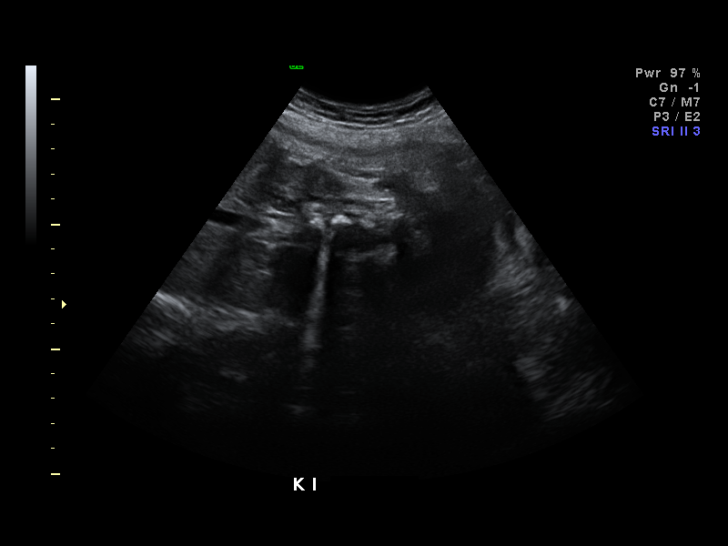
[im 22/32]
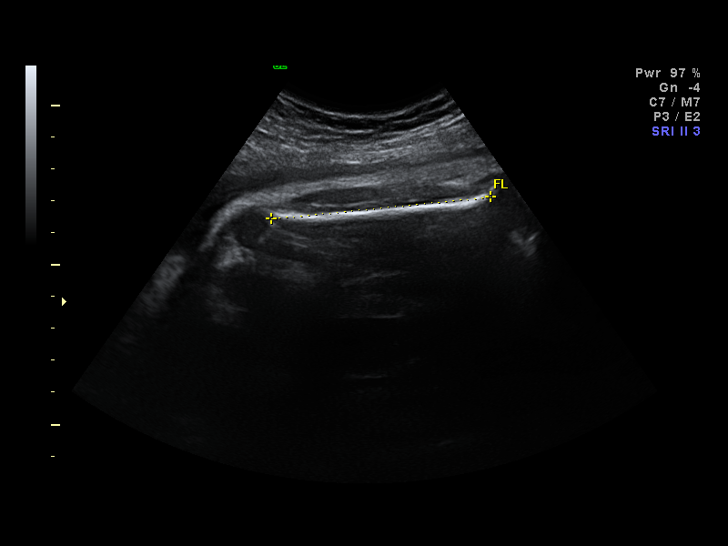
[im 25/32]
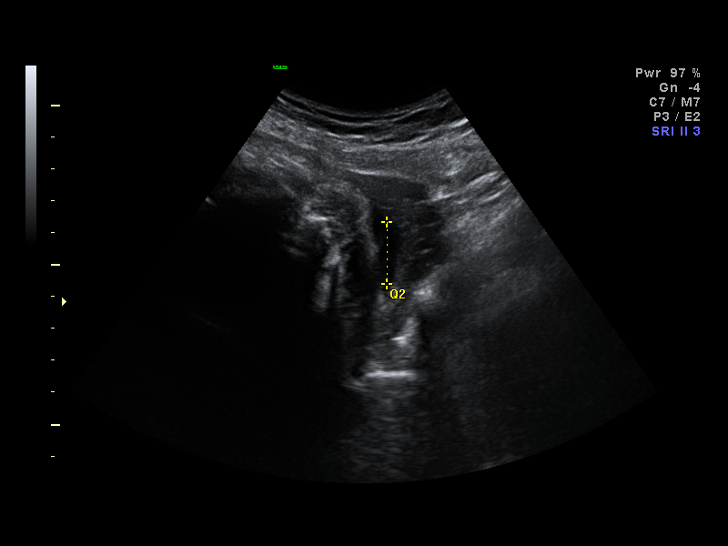
[im 27/32]
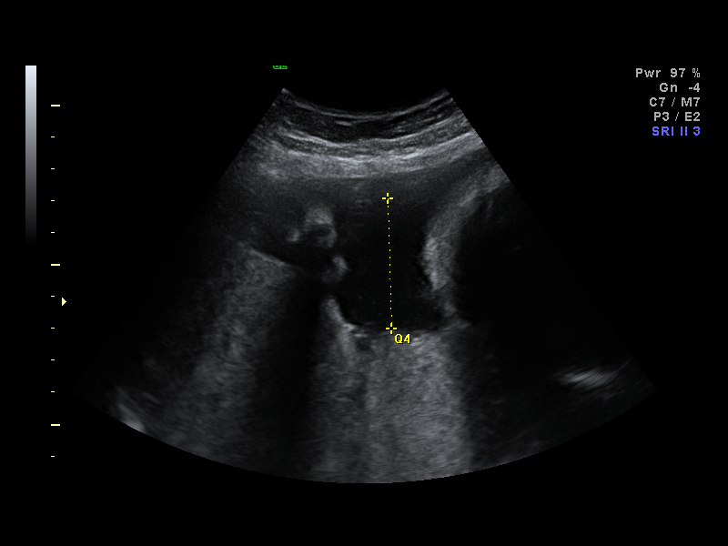
[im 29/32]
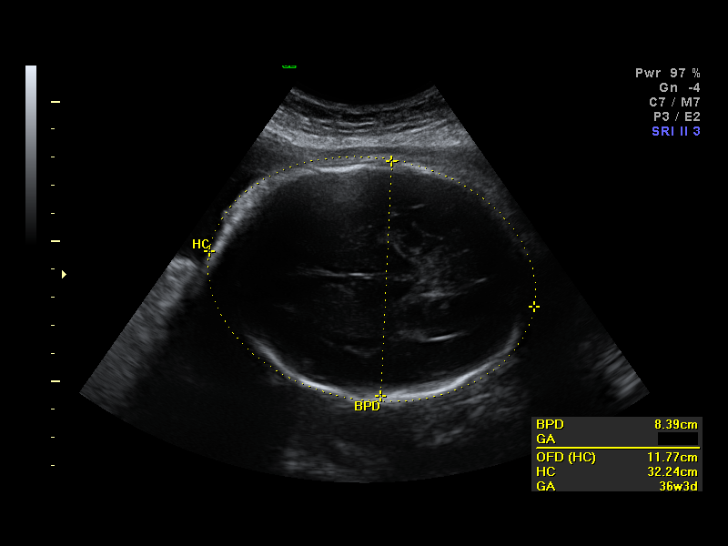
[im 32/32]
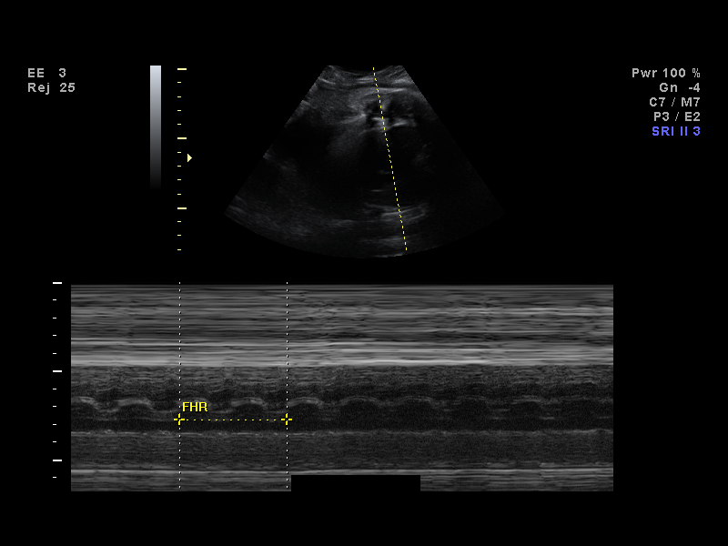

[14 of 28 positions shown; findings below may reference images not displayed]

Canned report from images found in remote index.

Refer to host system for actual result text.

## 2012-09-05 ENCOUNTER — Other Ambulatory Visit: Payer: Self-pay | Admitting: *Deleted

## 2012-09-05 ENCOUNTER — Other Ambulatory Visit: Payer: 59

## 2012-09-05 DIAGNOSIS — A64 Unspecified sexually transmitted disease: Secondary | ICD-10-CM

## 2012-09-06 LAB — WET PREP BY MOLECULAR PROBE: Trichomonas vaginosis: NEGATIVE

## 2012-09-06 LAB — HIV ANTIBODY (ROUTINE TESTING W REFLEX): HIV: NONREACTIVE

## 2012-09-06 LAB — GC/CHLAMYDIA PROBE AMP: GC Probe RNA: NEGATIVE

## 2012-09-06 LAB — RPR

## 2012-09-09 ENCOUNTER — Emergency Department (HOSPITAL_COMMUNITY)
Admission: EM | Admit: 2012-09-09 | Discharge: 2012-09-09 | Disposition: A | Discharge status: Home / Self Care | Payer: 59 | Attending: Emergency Medicine | Admitting: Emergency Medicine

## 2012-09-09 ENCOUNTER — Emergency Department (HOSPITAL_COMMUNITY): Payer: 59

## 2012-09-09 ENCOUNTER — Encounter (HOSPITAL_COMMUNITY): Payer: Self-pay | Admitting: *Deleted

## 2012-09-09 DIAGNOSIS — Z8639 Personal history of other endocrine, nutritional and metabolic disease: Secondary | ICD-10-CM | POA: Insufficient documentation

## 2012-09-09 DIAGNOSIS — R300 Dysuria: Secondary | ICD-10-CM | POA: Insufficient documentation

## 2012-09-09 DIAGNOSIS — N200 Calculus of kidney: Secondary | ICD-10-CM | POA: Insufficient documentation

## 2012-09-09 DIAGNOSIS — Z862 Personal history of diseases of the blood and blood-forming organs and certain disorders involving the immune mechanism: Secondary | ICD-10-CM | POA: Insufficient documentation

## 2012-09-09 DIAGNOSIS — Z3202 Encounter for pregnancy test, result negative: Secondary | ICD-10-CM | POA: Insufficient documentation

## 2012-09-09 DIAGNOSIS — G43909 Migraine, unspecified, not intractable, without status migrainosus: Secondary | ICD-10-CM | POA: Insufficient documentation

## 2012-09-09 DIAGNOSIS — Z79899 Other long term (current) drug therapy: Secondary | ICD-10-CM | POA: Insufficient documentation

## 2012-09-09 DIAGNOSIS — Z8739 Personal history of other diseases of the musculoskeletal system and connective tissue: Secondary | ICD-10-CM | POA: Insufficient documentation

## 2012-09-09 DIAGNOSIS — R197 Diarrhea, unspecified: Secondary | ICD-10-CM | POA: Insufficient documentation

## 2012-09-09 DIAGNOSIS — R350 Frequency of micturition: Secondary | ICD-10-CM | POA: Insufficient documentation

## 2012-09-09 DIAGNOSIS — R110 Nausea: Secondary | ICD-10-CM | POA: Insufficient documentation

## 2012-09-09 DIAGNOSIS — R3915 Urgency of urination: Secondary | ICD-10-CM | POA: Insufficient documentation

## 2012-09-09 DIAGNOSIS — Z8709 Personal history of other diseases of the respiratory system: Secondary | ICD-10-CM | POA: Insufficient documentation

## 2012-09-09 LAB — COMPREHENSIVE METABOLIC PANEL
ALT: 14 U/L (ref 0–35)
CO2: 29 mEq/L (ref 19–32)
Calcium: 9.2 mg/dL (ref 8.4–10.5)
Creatinine, Ser: 1.01 mg/dL (ref 0.50–1.10)
GFR calc Af Amer: 87 mL/min — ABNORMAL LOW (ref >90)
GFR calc non Af Amer: 75 mL/min — ABNORMAL LOW (ref >90)
Glucose, Bld: 116 mg/dL — ABNORMAL HIGH (ref 70–99)
Sodium: 137 mEq/L (ref 135–145)
Total Protein: 7.6 g/dL (ref 6.0–8.3)

## 2012-09-09 LAB — URINALYSIS W MICROSCOPIC + REFLEX CULTURE
Nitrite: NEGATIVE
Protein, ur: 100 mg/dL — AB
Specific Gravity, Urine: 1.026 (ref 1.005–1.030)
Urobilinogen, UA: 1 mg/dL (ref 0.0–1.0)

## 2012-09-09 LAB — CBC WITH DIFFERENTIAL/PLATELET
Basophils Absolute: 0 10*3/uL (ref 0.0–0.1)
Eosinophils Absolute: 0.1 10*3/uL (ref 0.0–0.7)
Eosinophils Relative: 1 % (ref 0–5)
HCT: 44.3 % (ref 36.0–46.0)
Lymphocytes Relative: 17 % (ref 12–46)
Lymphs Abs: 1.8 10*3/uL (ref 0.7–4.0)
MCH: 31.5 pg (ref 26.0–34.0)
MCV: 91.7 fL (ref 78.0–100.0)
Monocytes Absolute: 1.1 10*3/uL — ABNORMAL HIGH (ref 0.1–1.0)
Platelets: 248 10*3/uL (ref 150–400)
RDW: 13.1 % (ref 11.5–15.5)

## 2012-09-09 MED ORDER — KETOROLAC TROMETHAMINE 30 MG/ML IJ SOLN
30.0000 mg | Freq: Once | INTRAMUSCULAR | Status: AC
  Administered 2012-09-09: 30 mg via INTRAVENOUS
  Filled 2012-09-09: qty 1

## 2012-09-09 MED ORDER — ONDANSETRON HCL 4 MG/2ML IJ SOLN
4.0000 mg | Freq: Once | INTRAMUSCULAR | Status: DC
  Filled 2012-09-09: qty 2

## 2012-09-09 MED ORDER — ONDANSETRON 8 MG PO TBDP: 8.0000 mg | ORAL_TABLET | Freq: Three times a day (TID) | ORAL | Status: DC | PRN

## 2012-09-09 MED ORDER — IBUPROFEN 600 MG PO TABS: 600.0000 mg | ORAL_TABLET | Freq: Four times a day (QID) | ORAL | Status: DC | PRN

## 2012-09-09 MED ORDER — TAMSULOSIN HCL 0.4 MG PO CAPS: 0.4000 mg | ORAL_CAPSULE | Freq: Every day | ORAL | Status: DC

## 2012-09-09 MED ORDER — PROMETHAZINE HCL 25 MG/ML IJ SOLN
12.5000 mg | Freq: Once | INTRAMUSCULAR | Status: AC
  Administered 2012-09-09: 12.5 mg via INTRAVENOUS
  Filled 2012-09-09: qty 1

## 2012-09-09 MED ORDER — SODIUM CHLORIDE 0.9 % IV BOLUS (SEPSIS)
1000.0000 mL | Freq: Once | INTRAVENOUS | Status: AC
  Administered 2012-09-09: 1000 mL via INTRAVENOUS

## 2012-09-09 MED ORDER — MORPHINE SULFATE 4 MG/ML IJ SOLN
4.0000 mg | Freq: Once | INTRAMUSCULAR | Status: AC
  Administered 2012-09-09: 4 mg via INTRAVENOUS
  Filled 2012-09-09 (×2): qty 1

## 2012-09-09 MED ORDER — OXYCODONE-ACETAMINOPHEN 5-325 MG PO TABS: 1.0000 | ORAL_TABLET | ORAL | Status: DC | PRN

## 2012-09-09 NOTE — ED Provider Notes (Signed)
Medical screening examination/treatment/procedure(s) were performed by non-physician practitioner and as supervising physician I was immediately available for consultation/collaboration.   Hurman Horn, MD 09/09/12 [id number]

## 2012-09-09 NOTE — ED Notes (Signed)
Patient states she feels like she has to void but is unable to void on her own. In and out performed for UA sample. Bladder scanned showed 109 in bladder.

## 2012-09-09 NOTE — ED Provider Notes (Signed)
History    CSN: [id number] Arrival date & time 09/09/12  8295  First MD Initiated Contact with Patient 09/09/12 [phone]     Chief Complaint  Patient presents with  . Flank Pain   (Consider location/radiation/quality/duration/timing/severity/associated sxs/prior Treatment) HPI Michele Mcbride is a 27 y.o. female who presents to ED with complaint of left flank pain radiating into left lower abdomen. States pain started yesterday. Started with urinary urgency and frequency, pain onset gradual after that. Pt state pain is constant. "sharp and dull" at the same time. States no vaginal discharge, bleeding. States has PCOS. Had her IUD removed 2 wks ago, pt trying to get pregnant. Pt states she has not had fever, chills, admits to nausea, no vomiting or diarrhea. Last  bowel movement yesterday, states was "loose."  Past Medical History  Diagnosis Date  . Sinus infection   . Migraine   . Fibromyalgia   . PCOS (polycystic ovarian syndrome)    History reviewed. No pertinent past surgical history. History reviewed. No pertinent family history. History  Substance Use Topics  . Smoking status: Never Smoker   . Smokeless tobacco: Not on file  . Alcohol Use: No   OB History   Grav Para Term Preterm Abortions TAB SAB Ect Mult Living                 Review of Systems  Constitutional: Negative for fever and chills.  HENT: Negative for neck pain and neck stiffness.   Respiratory: Negative.   Cardiovascular: Negative.   Gastrointestinal: Positive for nausea, abdominal pain and diarrhea. Negative for vomiting.  Genitourinary: Positive for dysuria, urgency, frequency and flank pain. Negative for vaginal bleeding, vaginal discharge and pelvic pain.  Neurological: Negative for dizziness, weakness, light-headedness and headaches.    Allergies  Review of patient's allergies indicates no known allergies.  Home Medications   Current Outpatient Rx  Name  Route  Sig  Dispense  Refill  .  butalbital-acetaminophen-caffeine (FIORICET) 50-325-40 MG per tablet   Oral   Take 1-2 tablets by mouth every 6 (six) hours as needed for pain or headache.         . cholecalciferol (VITAMIN D) 1000 UNITS tablet   Oral   Take 1,000 Units by mouth daily.         . DULoxetine (CYMBALTA) 60 MG capsule   Oral   Take 60 mg by mouth daily.         . Multiple Vitamin (MULTIVITAMIN WITH MINERALS) TABS   Oral   Take 1 tablet by mouth daily.         Marland Kitchen spironolactone (ALDACTONE) 100 MG tablet   Oral   Take 100 mg by mouth daily.         . SUMAtriptan (IMITREX) 100 MG tablet   Oral   Take 100 mg by mouth every 2 (two) hours as needed for migraine.          Marland Kitchen zolpidem (AMBIEN) 10 MG tablet   Oral   Take 10 mg by mouth at bedtime as needed. For sleep          BP 114/85  Pulse 81  Temp(Src) 98.2 F (36.8 C) (Oral)  Ht 5\' 2"  (1.575 m)  Wt 148 lb (67.132 kg)  BMI 27.06 kg/m2  SpO2 100% Physical Exam  Nursing note and vitals reviewed. Constitutional: She appears well-developed and well-nourished. No distress.  Eyes: Conjunctivae are normal.  Neck: Neck supple.  Cardiovascular: Normal rate, regular rhythm and  normal heart sounds.   Pulmonary/Chest: Effort normal and breath sounds normal. No respiratory distress. She has no wheezes. She has no rales.  Abdominal: Soft. Bowel sounds are normal. She exhibits no distension. There is tenderness. There is no rebound and no guarding.  LUQ and LLQ tenderness, left CVA tenderness  Musculoskeletal: She exhibits no edema.  Neurological: She is alert.  Skin: Skin is warm and dry.    ED Course  Procedures (including critical care time) Labs Reviewed  CBC WITH DIFFERENTIAL - Abnormal; Notable for the following:    Hemoglobin 15.2 (*)    Monocytes Absolute 1.1 (*)    All other components within normal limits  COMPREHENSIVE METABOLIC PANEL - Abnormal; Notable for the following:    Glucose, Bld 116 (*)    GFR calc non Af Amer 75  (*)    GFR calc Af Amer 87 (*)    All other components within normal limits  URINALYSIS W MICROSCOPIC + REFLEX CULTURE - Abnormal; Notable for the following:    Color, Urine AMBER (*)    APPearance CLOUDY (*)    Hgb urine dipstick LARGE (*)    Bilirubin Urine SMALL (*)    Ketones, ur 15 (*)    Protein, ur 100 (*)    Leukocytes, UA TRACE (*)    All other components within normal limits  LIPASE, BLOOD  POCT PREGNANCY, URINE   8:54 AM Pt reassessed. Pain only mildly improved with 4mg  of morphine and 12.5 of phenergan. Blood work unremarkable. [place] with large Hgb, trace leukocytes, no WBCs. Will get CT to evaluate for a kidney stone. toradol added.   Ct Abdomen Pelvis Wo Contrast  09/09/2012   *RADIOLOGY REPORT*  Clinical Data: Left flank pain  CT ABDOMEN AND PELVIS WITHOUT CONTRAST  Technique:  Multidetector CT imaging of the abdomen and pelvis was performed following the standard protocol without intravenous contrast.  Comparison: Pelvic ultrasound 10/21/2011; prior CT abdomen/pelvis 10/09/2007  Findings:  Lower Chest:  Trace dependent atelectasis in the lower lungs.  The lung bases are otherwise clear.  Cardiac structures within normal limits for size.  No pericardial effusion.  Unremarkable distal thoracic esophagus.  Abdomen: Unenhanced CT was performed per clinician order.  Lack of IV contrast limits sensitivity and specificity, especially for evaluation of abdominal/pelvic solid viscera.  Within these limitations, unremarkable CT appearance of the stomach, duodenum, spleen and adjacent splenule, adrenal glands and pancreas.  Normal hepatic morphology and contours.  No focal hepatic lesion. Gallbladder is unremarkable. No intra or extrahepatic biliary ductal dilatation.  No hydronephrosis or nephrolithiasis bilaterally.  There may be minimal fullness of the left renal pelvis but no definite hydronephrosis.  Normal-caliber large and small bowel throughout the abdomen.  No evidence of bowel  obstruction.  No significant colonic diverticulosis.  Normal appendix in the lower midline abdomen.  No free fluid or suspicious adenopathy.  Pelvis: Mild dilatation of the distal left ureter.  No significant peri ureteral stranding.  There is a 5 mm stone layering dependently within the bladder versus in the distal-most UVJ. Unremarkable uterus and adnexa save for a single coarse calcification in the right ovary.  Bones: No acute fracture or aggressive appearing lytic or blastic osseous lesion.  Vascular: No focal vascular abnormality.  IMPRESSION:  1.  Nonobstructing 5 mm stone either within the distal-most left UVJ, or recently passed and layering dependently within the bladder.  Given the relative absence of hydronephrosis, this likely represents a recently passed stone.  2.  No  additional nephrolithiasis identified.   Original Report Authenticated By: Malachy Moan, M.D.   1. Kidney stone on left side     MDM  Pt here with left flank pain, problems with urination. Urine did not show any obvious infection, cultures sent. CT obtained due to large hgb. CT showed 5mm stone in left UVJ vs in the bladder. There was no hydronephrosis. Pt feeling better. Suspect she passed the stone. UA does not appear infected, cultures sent. She was able to urinate with no problems. Her vs are normal. She is ready to go home. Will still provide pain medications, flomax, nausea medications, incase stone still there and urology referral.   Filed Vitals:   09/09/12 0652 09/09/12 0953  BP: 114/85 100/64  Pulse: 81 77  Temp: 98.2 F (36.8 C)   TempSrc: Oral   Resp:  16  Height: 5\' 2"  (1.575 m)   Weight: 148 lb (67.132 kg)   SpO2: 100% 100%     Vallen Calabrese A Samit Sylve, PA-C 09/09/12 1008

## 2012-09-09 NOTE — ED Notes (Signed)
Patient presents with c/o left flank pain that radiates around to the groin area.  Has had trouble urinating and when she does it is only a little amount.  Pain started yesterday

## 2012-09-18 ENCOUNTER — Other Ambulatory Visit: Payer: Self-pay | Admitting: Family Medicine

## 2012-09-18 ENCOUNTER — Encounter: Payer: Self-pay | Admitting: Lab

## 2012-09-18 MED ORDER — ALPRAZOLAM 0.25 MG PO TABS: 0.2500 mg | ORAL_TABLET | Freq: Two times a day (BID) | ORAL | Status: DC | PRN

## 2012-09-18 NOTE — Progress Notes (Signed)
Pt has appt tomorrow- mom in office today indicating that pt's husband walked out on her and she's having a 'terrible time'.  Script given to get pt through until her appt tomorrow

## 2012-09-19 ENCOUNTER — Encounter: Payer: Self-pay | Admitting: Family Medicine

## 2012-09-19 ENCOUNTER — Ambulatory Visit (INDEPENDENT_AMBULATORY_CARE_PROVIDER_SITE_OTHER): Payer: 59 | Admitting: Family Medicine

## 2012-09-19 VITALS — BP 100/78 | HR 91 | Temp 98.4°F | Ht 61.0 in | Wt 149.8 lb

## 2012-09-19 DIAGNOSIS — F32A Depression, unspecified: Secondary | ICD-10-CM | POA: Insufficient documentation

## 2012-09-19 DIAGNOSIS — F341 Dysthymic disorder: Secondary | ICD-10-CM

## 2012-09-19 DIAGNOSIS — F419 Anxiety disorder, unspecified: Secondary | ICD-10-CM

## 2012-09-19 MED ORDER — DULOXETINE HCL 30 MG PO CPEP: 60.0000 mg | ORAL_CAPSULE | Freq: Every day | ORAL | Status: DC

## 2012-09-19 NOTE — Assessment & Plan Note (Signed)
New.  Severe.  Restart Cymbalta at 30mg  daily x2 weeks and then increase to 60mg .  Pt given script for xanax yesterday. Encouraged her to fill this and use prn.  Names and #s of counselors provided.  Will follow closely.

## 2012-09-19 NOTE — Progress Notes (Signed)
  Subjective:    Patient ID: LAIYAH EXLINE, female    DOB: Feb 24, 1986, 27 y.o.   MRN: [id number]  HPI Anxiety- husband left her, has 2 kids.  'overwhelming'.  Pt was blind sided by marital issues- husband had affair.  Husband refusing to come home.  Is now working w/ attorney to set up child support and visitation.  Children are now very cling-y.  Having panic attacks at work.  Has hx of migraines and these are triggering more frequently.  Has been out of work- missed the first week after he left and sporadic days since due to anxiety and tears.  Previously on Cymbalta- stopped this b/c she and husband were trying to get pregnant w/ #3.     Review of Systems For ROS see HPI     Objective:   Physical Exam  Vitals reviewed. Constitutional: She is oriented to person, place, and time. She appears well-developed and well-nourished. She appears distressed (clearly upset, tearful).  Neurological: She is alert and oriented to person, place, and time.  Psychiatric:  Anxious, shaking          Assessment & Plan:

## 2012-09-19 NOTE — Patient Instructions (Addendum)
Follow up in 3-4 weeks to recheck mood Restart the Cymbalta- take 1 tab daily x2 weeks and then increase to 2 tabs daily Use the Alprazolam as needed for panic Please consider counseling Drop off the forms for me to complete Hang in there!

## 2012-10-01 ENCOUNTER — Encounter: Payer: Self-pay | Admitting: Obstetrics

## 2012-10-11 ENCOUNTER — Ambulatory Visit: Payer: Self-pay | Admitting: Obstetrics & Gynecology

## 2012-10-19 ENCOUNTER — Ambulatory Visit: Payer: 59 | Admitting: Family Medicine

## 2012-10-19 DIAGNOSIS — Z0289 Encounter for other administrative examinations: Secondary | ICD-10-CM

## 2012-11-21 ENCOUNTER — Encounter (HOSPITAL_COMMUNITY): Payer: Self-pay | Admitting: Emergency Medicine

## 2012-11-21 ENCOUNTER — Emergency Department (HOSPITAL_COMMUNITY)
Admission: EM | Admit: 2012-11-21 | Discharge: 2012-11-21 | Disposition: A | Discharge status: Home / Self Care | Payer: 59 | Attending: Emergency Medicine | Admitting: Emergency Medicine

## 2012-11-21 DIAGNOSIS — M542 Cervicalgia: Secondary | ICD-10-CM | POA: Insufficient documentation

## 2012-11-21 DIAGNOSIS — Z8742 Personal history of other diseases of the female genital tract: Secondary | ICD-10-CM | POA: Insufficient documentation

## 2012-11-21 DIAGNOSIS — G43909 Migraine, unspecified, not intractable, without status migrainosus: Secondary | ICD-10-CM | POA: Insufficient documentation

## 2012-11-21 DIAGNOSIS — Z79899 Other long term (current) drug therapy: Secondary | ICD-10-CM | POA: Insufficient documentation

## 2012-11-21 DIAGNOSIS — IMO0001 Reserved for inherently not codable concepts without codable children: Secondary | ICD-10-CM | POA: Insufficient documentation

## 2012-11-21 MED ORDER — MORPHINE SULFATE 4 MG/ML IJ SOLN
6.0000 mg | Freq: Once | INTRAMUSCULAR | Status: AC
  Administered 2012-11-21: 6 mg via INTRAVENOUS
  Filled 2012-11-21: qty 2

## 2012-11-21 MED ORDER — METOCLOPRAMIDE HCL 5 MG/ML IJ SOLN
10.0000 mg | INTRAMUSCULAR | Status: AC
  Administered 2012-11-21: 10 mg via INTRAVENOUS
  Filled 2012-11-21: qty 2

## 2012-11-21 MED ORDER — KETOROLAC TROMETHAMINE 30 MG/ML IJ SOLN
30.0000 mg | Freq: Once | INTRAMUSCULAR | Status: AC
  Administered 2012-11-21: 30 mg via INTRAVENOUS
  Filled 2012-11-21: qty 1

## 2012-11-21 MED ORDER — SODIUM CHLORIDE 0.9 % IV BOLUS (SEPSIS)
1000.0000 mL | INTRAVENOUS | Status: AC
  Administered 2012-11-21: 1000 mL via INTRAVENOUS

## 2012-11-21 MED ORDER — DIPHENHYDRAMINE HCL 50 MG/ML IJ SOLN
25.0000 mg | Freq: Once | INTRAMUSCULAR | Status: AC
  Administered 2012-11-21: 25 mg via INTRAVENOUS
  Filled 2012-11-21: qty 1

## 2012-11-21 NOTE — ED Provider Notes (Signed)
CSN: [id number]     Arrival date & time 11/21/12  1015 History   First MD Initiated Contact with Patient 11/21/12 1053     Chief Complaint  Patient presents with  . Migraine   (Consider location/radiation/quality/duration/timing/severity/associated sxs/prior Treatment) Patient is a 27 y.o. female presenting with migraines.  Migraine Associated symptoms include headaches, nausea and neck pain. Pertinent negatives include no abdominal pain, chest pain, chills, fever, numbness, vomiting or weakness.  Pt is a 27yo female with hx of migraines c/o gradual onset of a migraine headache.  Headache started yesterday when pt woke up, pain was on right side of head but now radiating to left side and down her neck. Pain is 10/10. Has taken her max dose of imitrex w/o relief. HA is associated with nausea and feels similar to previous migraines. Does admit to increased stress lately as she is going through a separation at this time. Denies LOC, use of blood thinners, vomiting, seizure. Denies fever, cough, chest pain or abdominal pain. Denies numbness or tingling in arms or legs. Reports having to come to ER two other times for migraine treatments.   Past Medical History  Diagnosis Date  . Sinus infection   . Migraine   . Fibromyalgia   . PCOS (polycystic ovarian syndrome)    No past surgical history on file. No family history on file. History  Substance Use Topics  . Smoking status: Never Smoker   . Smokeless tobacco: Not on file  . Alcohol Use: No   OB History   Grav Para Term Preterm Abortions TAB SAB Ect Mult Living                 Review of Systems  Constitutional: Negative for fever and chills.  HENT: Positive for neck pain. Negative for neck stiffness.   Eyes: Positive for photophobia. Negative for pain, redness and visual disturbance.  Respiratory: Negative for shortness of breath.   Cardiovascular: Negative for chest pain.  Gastrointestinal: Positive for nausea. Negative for  vomiting, abdominal pain and diarrhea.  Neurological: Positive for headaches. Negative for dizziness, seizures, syncope, weakness, light-headedness and numbness.  All other systems reviewed and are negative.    Allergies  Review of patient's allergies indicates no known allergies.  Home Medications   Current Outpatient Rx  Name  Route  Sig  Dispense  Refill  . DULoxetine (CYMBALTA) 30 MG capsule   Oral   Take 2 capsules (60 mg total) by mouth daily.   60 capsule   3   . spironolactone (ALDACTONE) 100 MG tablet   Oral   Take 100 mg by mouth daily.         . SUMAtriptan (IMITREX) 100 MG tablet   Oral   Take 100 mg by mouth every 2 (two) hours as needed for migraine.           BP 106/73  Pulse 84  Temp(Src) 98.5 F (36.9 C) (Oral)  Resp 18  Ht 5\' 2"  (1.575 m)  Wt 140 lb (63.504 kg)  BMI 25.6 kg/m2  SpO2 98% Physical Exam  Nursing note and vitals reviewed. Constitutional: She is oriented to person, place, and time. She appears well-developed and well-nourished. No distress.  Pt lying in exam bed, eyes closed with towel over her face.  HENT:  Head: Normocephalic and atraumatic.  Eyes: Conjunctivae and EOM are normal. Pupils are equal, round, and reactive to light. Right eye exhibits no discharge. Left eye exhibits no discharge. No scleral icterus.  Neck:  Normal range of motion. Neck supple.  No nuchal rigidity or meningeal signs.   Cardiovascular: Normal rate, regular rhythm and normal heart sounds.   Pulmonary/Chest: Effort normal and breath sounds normal. No respiratory distress. She has no wheezes. She has no rales. She exhibits no tenderness.  Abdominal: Soft. Bowel sounds are normal. She exhibits no distension and no mass. There is no tenderness. There is no rebound and no guarding.  Musculoskeletal: Normal range of motion.  Neurological: She is alert and oriented to person, place, and time. She has normal strength. No cranial nerve deficit or sensory deficit.  She displays a negative Romberg sign. Coordination and gait normal. GCS eye subscore is 4. GCS verbal subscore is 5. GCS motor subscore is 6.  CN II-XII in tact, no focal deficit, nl finger to nose coordination. Nl sensation, 5/5 strength in all major muscle groups. Neg romberg and nl gait.    Skin: Skin is warm and dry. She is not diaphoretic.    ED Course  Procedures (including critical care time) Labs Review Labs Reviewed - No data to display Imaging Review No results found.  MDM   1. Migraine    Pt presenting with typical migraine per pt that was not relieved with home medications. Non focal neuro exam. No recent head trauma. No fever. Doubt meningitis. Doubt intracranial bleed. Doubt normal pressure hydrocephalus. No indication for imaging. Will treat with migraine cocktail and reevaluate   12:54 PM Pt states she feels much better and is requesting discharge home so she can go to sleep.  Was able to stand and ambulate without difficulty. Will discharge pt home and have pt f/u with Dr. Tamela Oddi, PCP as needed.  Pt stated she did not need any refills on migraine medication today. Return precautions given. Pt verbalized understanding and agreement with tx plan. Vitals: unremarkable. Discharged in stable condition.    Discussed pt with Dr. Jodi Mourning during ED encounter and agrees with plan.    Junius Finner, PA-C 11/21/12 1255  Medical screening examination/treatment/procedure(s) were conducted as a shared visit with non-physician practitioner(s) or resident and myself. I personally evaluated the patient during the encounter and agree with the findings and plan unless otherwise indicated.  HA with nausea since yesterday, gradual onset, similar to previous, no head injuries/ tic bites/ fevers or neck pain. Various causes. Pt has had head imaging in the past and has outpt fup. 5+ strength in UE and LE with f/e at major joints.  Sensation to palpation intact in UE and LE.  CNs 2-12  grossly intact. EOMFI. PERRL.  Finger nose and coordination intact bilateral.  Visual fields intact to finger testing.  Supportive care, pain meds given, improved significantly in ED. Fup discussed.    Enid Skeens, MD 11/22/12 2147

## 2012-11-21 NOTE — ED Notes (Signed)
H/a started yesterday woke up w/ it has taken max dose of imitrex and  Has had some nausea

## 2012-12-20 ENCOUNTER — Telehealth: Payer: Self-pay | Admitting: *Deleted

## 2012-12-20 DIAGNOSIS — G43909 Migraine, unspecified, not intractable, without status migrainosus: Secondary | ICD-10-CM

## 2012-12-20 MED ORDER — PROMETHAZINE HCL 25 MG PO TABS: 25.0000 mg | ORAL_TABLET | Freq: Four times a day (QID) | ORAL | Status: DC | PRN

## 2012-12-20 MED ORDER — BUTALBITAL-APAP-CAFFEINE 50-325-40 MG PO TABS: 1.0000 | ORAL_TABLET | Freq: Two times a day (BID) | ORAL | Status: DC | PRN

## 2012-12-29 ENCOUNTER — Encounter (HOSPITAL_COMMUNITY): Payer: Self-pay | Admitting: Emergency Medicine

## 2012-12-29 ENCOUNTER — Emergency Department (HOSPITAL_COMMUNITY)
Admission: EM | Admit: 2012-12-29 | Discharge: 2012-12-29 | Disposition: A | Discharge status: Home / Self Care | Payer: 59 | Source: Home / Self Care | Attending: Emergency Medicine | Admitting: Emergency Medicine

## 2012-12-29 DIAGNOSIS — J020 Streptococcal pharyngitis: Secondary | ICD-10-CM

## 2012-12-29 DIAGNOSIS — J029 Acute pharyngitis, unspecified: Secondary | ICD-10-CM

## 2012-12-29 MED ORDER — AMOXICILLIN 500 MG PO CAPS: 500.0000 mg | ORAL_CAPSULE | Freq: Two times a day (BID) | ORAL | Status: DC

## 2012-12-29 NOTE — ED Provider Notes (Signed)
CSN: [id number]     Arrival date & time 12/29/12  1024 History   First MD Initiated Contact with Patient 12/29/12 1143     Chief Complaint  Patient presents with  . Sore Throat   (Consider location/radiation/quality/duration/timing/severity/associated sxs/prior Treatment) HPI Comments: Michele Mcbride presents with 2 day history of sore throat, and neck nodal pain. Fevers are subjective but noted. No respiratory symptoms, but some mild ear pain as well. Noted fatigue.   The history is provided by the patient.    Past Medical History  Diagnosis Date  . Sinus infection   . Migraine   . Fibromyalgia   . PCOS (polycystic ovarian syndrome)    History reviewed. No pertinent past surgical history. No family history on file. History  Substance Use Topics  . Smoking status: Never Smoker   . Smokeless tobacco: Not on file  . Alcohol Use: No   OB History   Grav Para Term Preterm Abortions TAB SAB Ect Mult Living                 Review of Systems  All other systems reviewed and are negative.    Allergies  Review of patient's allergies indicates no known allergies.  Home Medications   Current Outpatient Rx  Name  Route  Sig  Dispense  Refill  . amoxicillin (AMOXIL) 500 MG capsule   Oral   Take 1 capsule (500 mg total) by mouth 2 (two) times daily.   20 capsule   0   . butalbital-acetaminophen-caffeine (FIORICET) 50-325-40 MG per tablet   Oral   Take 1 tablet by mouth 2 (two) times daily as needed for headache.   40 tablet   2   . DULoxetine (CYMBALTA) 30 MG capsule   Oral   Take 2 capsules (60 mg total) by mouth daily.   60 capsule   3   . promethazine (PHENERGAN) 25 MG tablet   Oral   Take 1 tablet (25 mg total) by mouth every 6 (six) hours as needed for nausea.   40 tablet   2   . spironolactone (ALDACTONE) 100 MG tablet   Oral   Take 100 mg by mouth daily.         . SUMAtriptan (IMITREX) 100 MG tablet   Oral   Take 100 mg by mouth every 2 (two) hours as  needed for migraine.           BP 111/79  Pulse 94  Temp(Src) 98 F (36.7 C) (Oral)  Resp 18  SpO2 100% Physical Exam  Constitutional: She appears well-developed and well-nourished. No distress.  HENT:  Head: Normocephalic and atraumatic.  Right Ear: External ear normal.  Left Ear: External ear normal.  +3 tonsils with noted exudate and erythema  Neck: Normal range of motion.  Cardiovascular: Normal rate and regular rhythm.   Pulmonary/Chest: Effort normal and breath sounds normal. No respiratory distress. She has no wheezes. She has no rales.  Lymphadenopathy:    She has cervical adenopathy.  Skin: Skin is warm and dry.  Psychiatric: Her behavior is normal.    ED Course  Procedures (including critical care time) Labs Review Labs Reviewed  POCT RAPID STREP A ([facility] CARE ONLY)   Imaging Review No results found.  EKG Interpretation     Ventricular Rate:    PR Interval:    QRS Duration:   QT Interval:    QTC Calculation:   R Axis:     Text Interpretation:  MDM   1. Strep pharyngitis   2. Sore throat    Based on exam-cover for strep and suppurative care. F/U if worsens.    Riki Sheer, PA-C 12/29/12 1234

## 2012-12-29 NOTE — ED Notes (Signed)
Sore throat 10/24

## 2012-12-29 NOTE — ED Provider Notes (Signed)
Medical screening examination/treatment/procedure(s) were performed by non-physician practitioner and as supervising physician I was immediately available for consultation/collaboration.  Leslee Home, M.D.  Reuben Likes, MD 12/29/12 2026

## 2012-12-31 LAB — CULTURE, GROUP A STREP

## 2012-12-31 NOTE — Telephone Encounter (Signed)
Patient request RF- ok per Dr Clearance Coots

## 2012-12-31 NOTE — ED Notes (Signed)
Final culture report negative for strep

## 2012-12-31 NOTE — ED Notes (Signed)
Lab review

## 2013-01-10 ENCOUNTER — Other Ambulatory Visit: Payer: Self-pay

## 2013-02-26 ENCOUNTER — Other Ambulatory Visit: Payer: Self-pay | Admitting: Obstetrics

## 2013-03-13 ENCOUNTER — Other Ambulatory Visit: Payer: Self-pay | Admitting: Obstetrics & Gynecology

## 2013-03-13 DIAGNOSIS — E282 Polycystic ovarian syndrome: Secondary | ICD-10-CM

## 2013-03-13 MED ORDER — PROGESTERONE MICRONIZED 200 MG PO CAPS: 200.0000 mg | ORAL_CAPSULE | Freq: Every day | ORAL | Status: DC

## 2013-03-18 ENCOUNTER — Emergency Department (HOSPITAL_COMMUNITY)
Admission: EM | Admit: 2013-03-18 | Discharge: 2013-03-18 | Disposition: A | Discharge status: Home / Self Care | Payer: 59 | Source: Home / Self Care | Attending: Family Medicine | Admitting: Family Medicine

## 2013-03-18 ENCOUNTER — Emergency Department (INDEPENDENT_AMBULATORY_CARE_PROVIDER_SITE_OTHER): Payer: 59

## 2013-03-18 ENCOUNTER — Encounter (HOSPITAL_COMMUNITY): Payer: Self-pay | Admitting: Emergency Medicine

## 2013-03-18 DIAGNOSIS — W19XXXA Unspecified fall, initial encounter: Secondary | ICD-10-CM

## 2013-03-18 DIAGNOSIS — S8001XA Contusion of right knee, initial encounter: Secondary | ICD-10-CM

## 2013-03-18 DIAGNOSIS — S8000XA Contusion of unspecified knee, initial encounter: Secondary | ICD-10-CM

## 2013-03-18 LAB — POCT PREGNANCY, URINE: Preg Test, Ur: NEGATIVE

## 2013-03-18 MED ORDER — IBUPROFEN 800 MG PO TABS: 800.0000 mg | ORAL_TABLET | Freq: Three times a day (TID) | ORAL | Status: DC

## 2013-03-18 NOTE — Discharge Instructions (Signed)
Use ice and medicine and knee support as needed, keep scrape washed and covered regularly, see orthopedist if further problems.

## 2013-03-18 NOTE — ED Provider Notes (Signed)
CSN: [id number]     Arrival date & time 03/18/13  1854 History   First MD Initiated Contact with Patient 03/18/13 2035     Chief Complaint  Patient presents with  . Fall   (Consider location/radiation/quality/duration/timing/severity/associated sxs/prior Treatment) Patient is a 28 y.o. female presenting with fall. The history is provided by the patient.  Fall This is a new problem. The current episode started more than 2 days ago (landed on right knee in [employer] [facility] parking lot, knee still in pain.). The problem has been gradually worsening. Pertinent negatives include no chest pain and no abdominal pain. The symptoms are aggravated by bending.    Past Medical History  Diagnosis Date  . Sinus infection   . Migraine   . Fibromyalgia   . PCOS (polycystic ovarian syndrome)    History reviewed. No pertinent past surgical history. History reviewed. No pertinent family history. History  Substance Use Topics  . Smoking status: Never Smoker   . Smokeless tobacco: Not on file  . Alcohol Use: No   OB History   Grav Para Term Preterm Abortions TAB SAB Ect Mult Living                 Review of Systems  Constitutional: Negative.   Cardiovascular: Negative for chest pain.  Gastrointestinal: Negative for abdominal pain.  Musculoskeletal: Positive for gait problem. Negative for joint swelling.  Skin: Positive for wound.    Allergies  Review of patient's allergies indicates no known allergies.  Home Medications   Current Outpatient Rx  Name  Route  Sig  Dispense  Refill  . amoxicillin (AMOXIL) 500 MG capsule   Oral   Take 1 capsule (500 mg total) by mouth 2 (two) times daily.   20 capsule   0   . butalbital-acetaminophen-caffeine (FIORICET) 50-325-40 MG per tablet   Oral   Take 1 tablet by mouth 2 (two) times daily as needed for headache.   40 tablet   2   . DULoxetine (CYMBALTA) 30 MG capsule   Oral   Take 2 capsules (60 mg total) by mouth daily.   60 capsule   3   .  ibuprofen (ADVIL,MOTRIN) 800 MG tablet   Oral   Take 1 tablet (800 mg total) by mouth 3 (three) times daily. For knee pain   21 tablet   0   . progesterone (PROMETRIUM) 200 MG capsule   Oral   Take 1 capsule (200 mg total) by mouth daily. Take 10 - 14 days every 1 - 2 months   42 capsule   3   . promethazine (PHENERGAN) 25 MG tablet   Oral   Take 1 tablet (25 mg total) by mouth every 6 (six) hours as needed for nausea.   40 tablet   2   . spironolactone (ALDACTONE) 100 MG tablet   Oral   Take 100 mg by mouth daily.         . SUMAtriptan (IMITREX) 100 MG tablet   Oral   Take 100 mg by mouth every 2 (two) hours as needed for migraine.          . tretinoin (RETIN-A) 0.1 % cream      APPLY TO FACE EVERY EVENING   45 g   2    BP 116/70  Pulse 90  Temp(Src) 98 F (36.7 C) (Oral)  Resp 14  SpO2 100%  LMP 12/31/2012 Physical Exam  Nursing note and vitals reviewed. Constitutional: She is oriented to  person, place, and time. She appears well-developed and well-nourished.  Musculoskeletal: She exhibits tenderness.       Right knee: She exhibits decreased range of motion. She exhibits no swelling, no effusion, no ecchymosis, no deformity, no LCL laxity, normal patellar mobility, normal meniscus and no MCL laxity.       Legs: Neurological: She is alert and oriented to person, place, and time.  Skin: Skin is warm and dry.  Superficial abrasion over right knee, no erythema, or fb evident.    ED Course  Procedures (including critical care time) Labs Review Labs Reviewed  POCT PREGNANCY, URINE   Imaging Review Dg Knee Complete 4 Views Right  03/18/2013   CLINICAL DATA:  Pain and swelling.  Fall 3 days ago.  Knee pain.  EXAM: RIGHT KNEE - COMPLETE 4+ VIEW  COMPARISON:  None.  FINDINGS: There is no evidence of fracture, dislocation, or joint effusion. There is no evidence of arthropathy or other focal bone abnormality. Soft tissues are unremarkable.  IMPRESSION: Negative.    Electronically Signed   By: Dereck Ligas M.D.   On: 03/18/2013 20:53    EKG Interpretation    Date/Time:    Ventricular Rate:    PR Interval:    QRS Duration:   QT Interval:    QTC Calculation:   R Axis:     Text Interpretation:              MDM  X-rays reviewed and report per radiologist.     Billy Fischer, MD 03/18/13 2108

## 2013-03-18 NOTE — ED Notes (Signed)
Fall 1-9, landed on right knee; denies other injury, LMP 10-27

## 2013-03-29 ENCOUNTER — Encounter: Payer: Self-pay | Admitting: Family Medicine

## 2013-03-29 ENCOUNTER — Ambulatory Visit (INDEPENDENT_AMBULATORY_CARE_PROVIDER_SITE_OTHER): Payer: 59 | Admitting: Family Medicine

## 2013-03-29 VITALS — BP 120/82 | HR 92 | Temp 98.2°F | Resp 16 | Wt 145.0 lb

## 2013-03-29 DIAGNOSIS — J329 Chronic sinusitis, unspecified: Secondary | ICD-10-CM

## 2013-03-29 MED ORDER — AMOXICILLIN 875 MG PO TABS: 875.0000 mg | ORAL_TABLET | Freq: Two times a day (BID) | ORAL | Status: DC

## 2013-03-29 MED ORDER — PROMETHAZINE-DM 6.25-15 MG/5ML PO SYRP: 5.0000 mL | ORAL_SOLUTION | Freq: Four times a day (QID) | ORAL | Status: DC | PRN

## 2013-03-29 NOTE — Patient Instructions (Signed)
Follow up as needed Start the Amoxicillin twice daily- take w/ food- for the sinus infection Drink plenty of fluids REST! Use the cough syrup as needed- will cause drowsiness Mucinex DM for daytime cough Call with any questions or concerns Hang in there!

## 2013-03-29 NOTE — Progress Notes (Signed)
   Subjective:    Patient ID: Michele Mcbride, female    DOB: [birth year], 28 y.o.   MRN: [id number]  HPI URI- lost voice.  + PND, sinus pressure.  No fevers.  No ear pain.  + cough.  Intermittently productive.  No N/V/D.  + sick contacts.   Review of Systems For ROS see HPI     Objective:   Physical Exam  Vitals reviewed. Constitutional: She appears well-developed and well-nourished. No distress.  HENT:  Head: Normocephalic and atraumatic.  Right Ear: Tympanic membrane normal.  Left Ear: Tympanic membrane normal.  Nose: Mucosal edema and rhinorrhea present. Right sinus exhibits maxillary sinus tenderness. Right sinus exhibits no frontal sinus tenderness. Left sinus exhibits maxillary sinus tenderness. Left sinus exhibits no frontal sinus tenderness.  Mouth/Throat: Uvula is midline and mucous membranes are normal. Posterior oropharyngeal erythema present. No oropharyngeal exudate.  Eyes: Conjunctivae and EOM are normal. Pupils are equal, round, and reactive to light.  Neck: Normal range of motion. Neck supple.  Cardiovascular: Normal rate, regular rhythm and normal heart sounds.   Pulmonary/Chest: Effort normal and breath sounds normal. No respiratory distress. She has no wheezes.  Lymphadenopathy:    She has no cervical adenopathy.          Assessment & Plan:

## 2013-03-29 NOTE — Assessment & Plan Note (Signed)
Pt's sxs and PE consistent w/ infxn.  Start abx.  Cough meds prn.  Reviewed supportive care and red flags that should prompt return.  Pt expressed understanding and is in agreement w/ plan.  

## 2013-03-29 NOTE — Progress Notes (Signed)
Pre visit review using our clinic review tool, if applicable. No additional management support is needed unless otherwise documented below in the visit note. 

## 2013-05-16 ENCOUNTER — Other Ambulatory Visit: Payer: Self-pay | Admitting: *Deleted

## 2013-05-16 DIAGNOSIS — Z202 Contact with and (suspected) exposure to infections with a predominantly sexual mode of transmission: Secondary | ICD-10-CM

## 2013-05-16 MED ORDER — AZITHROMYCIN 250 MG PO TABS: 1000.0000 mg | ORAL_TABLET | Freq: Once | ORAL | Status: DC

## 2013-05-16 MED ORDER — CEFIXIME 400 MG PO TABS: 400.0000 mg | ORAL_TABLET | Freq: Once | ORAL | Status: DC

## 2013-06-13 ENCOUNTER — Other Ambulatory Visit: Payer: Self-pay | Admitting: *Deleted

## 2013-06-13 DIAGNOSIS — Z309 Encounter for contraceptive management, unspecified: Secondary | ICD-10-CM

## 2013-06-13 MED ORDER — NORETHINDRONE 0.35 MG PO TABS: 1.0000 | ORAL_TABLET | Freq: Every day | ORAL | Status: DC

## 2013-06-18 ENCOUNTER — Other Ambulatory Visit: Payer: Self-pay | Admitting: *Deleted

## 2013-06-18 DIAGNOSIS — B9689 Other specified bacterial agents as the cause of diseases classified elsewhere: Secondary | ICD-10-CM

## 2013-06-18 DIAGNOSIS — N760 Acute vaginitis: Principal | ICD-10-CM

## 2013-06-18 MED ORDER — METRONIDAZOLE 500 MG PO TABS: 500.0000 mg | ORAL_TABLET | Freq: Two times a day (BID) | ORAL | Status: DC

## 2013-07-01 ENCOUNTER — Other Ambulatory Visit: Payer: Self-pay | Admitting: Obstetrics & Gynecology

## 2013-07-03 NOTE — Telephone Encounter (Signed)
Please advise 

## 2013-07-04 NOTE — Telephone Encounter (Signed)
OK to refill

## 2013-07-11 ENCOUNTER — Encounter: Payer: Self-pay | Admitting: Obstetrics & Gynecology

## 2013-07-12 ENCOUNTER — Other Ambulatory Visit: Payer: Self-pay | Admitting: Obstetrics

## 2013-07-12 DIAGNOSIS — R52 Pain, unspecified: Secondary | ICD-10-CM

## 2013-07-12 MED ORDER — HYDROCODONE-ACETAMINOPHEN 7.5-300 MG PO TABS: 1.0000 | ORAL_TABLET | Freq: Four times a day (QID) | ORAL | Status: DC | PRN

## 2013-07-25 ENCOUNTER — Other Ambulatory Visit: Payer: Self-pay | Admitting: *Deleted

## 2013-07-25 DIAGNOSIS — J309 Allergic rhinitis, unspecified: Secondary | ICD-10-CM

## 2013-07-25 MED ORDER — FLUTICASONE PROPIONATE 50 MCG/ACT NA SUSP: 1.0000 | Freq: Two times a day (BID) | NASAL | Status: DC

## 2013-08-14 ENCOUNTER — Encounter: Payer: Self-pay | Admitting: Obstetrics & Gynecology

## 2013-08-14 ENCOUNTER — Ambulatory Visit (INDEPENDENT_AMBULATORY_CARE_PROVIDER_SITE_OTHER): Payer: 59 | Admitting: Obstetrics & Gynecology

## 2013-08-14 VITALS — BP 120/80 | HR 80 | Temp 98.4°F | Ht 62.0 in | Wt 153.0 lb

## 2013-08-14 DIAGNOSIS — Z3202 Encounter for pregnancy test, result negative: Secondary | ICD-10-CM

## 2013-08-14 DIAGNOSIS — Z30430 Encounter for insertion of intrauterine contraceptive device: Secondary | ICD-10-CM

## 2013-08-14 DIAGNOSIS — IMO0001 Reserved for inherently not codable concepts without codable children: Secondary | ICD-10-CM

## 2013-08-14 LAB — POCT URINE PREGNANCY: Preg Test, Ur: NEGATIVE

## 2013-08-14 MED ORDER — LEVONORGESTREL 20 MCG/24HR IU IUD
INTRAUTERINE_SYSTEM | Freq: Once | INTRAUTERINE | Status: AC
  Administered 2013-08-14: 18:00:00 via INTRAUTERINE

## 2013-08-15 LAB — GC/CHLAMYDIA PROBE AMP
CT PROBE, AMP APTIMA: NEGATIVE
GC Probe RNA: NEGATIVE

## 2013-08-16 ENCOUNTER — Encounter: Payer: Self-pay | Admitting: Obstetrics & Gynecology

## 2013-08-16 NOTE — Progress Notes (Signed)
IUD Insertion Procedure Note  Pre-operative Diagnosis: Requests [facility] Post-operative Diagnosis: Same  Indications: contraception  Procedure Details   The risks (including infection, bleeding, pain, and uterine perforation) and benefits of the procedure were explained to the patient and Written informed consent was obtained.    Cervix cleansed with Betadine. Uterus sounded to 8 cm. IUD inserted without difficulty. String visible and trimmed. Patient tolerated procedure well.  IUD Information: Mirena.  Condition: Stable  Complications: None  Plan:  The patient was advised to call for any fever or for prolonged or severe pain or bleeding. She was advised to use OTC analgesics as needed for mild to moderate pain.

## 2013-08-16 NOTE — Patient Instructions (Signed)

## 2013-09-19 ENCOUNTER — Other Ambulatory Visit: Payer: Self-pay | Admitting: *Deleted

## 2013-09-19 ENCOUNTER — Other Ambulatory Visit: Payer: 59

## 2013-09-19 DIAGNOSIS — N939 Abnormal uterine and vaginal bleeding, unspecified: Secondary | ICD-10-CM

## 2013-09-21 LAB — GC/CHLAMYDIA PROBE AMP
CT PROBE, AMP APTIMA: NEGATIVE
GC PROBE AMP APTIMA: NEGATIVE

## 2013-09-25 ENCOUNTER — Ambulatory Visit: Payer: 59 | Admitting: Obstetrics & Gynecology

## 2013-09-27 ENCOUNTER — Encounter: Payer: Self-pay | Admitting: Medical

## 2013-09-27 ENCOUNTER — Ambulatory Visit (INDEPENDENT_AMBULATORY_CARE_PROVIDER_SITE_OTHER): Payer: 59 | Admitting: Medical

## 2013-09-27 VITALS — BP 110/76 | HR 79 | Temp 98.3°F | Wt 149.0 lb

## 2013-09-27 DIAGNOSIS — J029 Acute pharyngitis, unspecified: Secondary | ICD-10-CM | POA: Insufficient documentation

## 2013-09-27 LAB — POCT RAPID STREP A (OFFICE): Rapid Strep A Screen: NEGATIVE

## 2013-09-27 MED ORDER — AMOXICILLIN 875 MG PO TABS: 875.0000 mg | ORAL_TABLET | Freq: Two times a day (BID) | ORAL | Status: DC

## 2013-09-27 NOTE — Progress Notes (Signed)
Pre visit review using our clinic review tool, if applicable. No additional management support is needed unless otherwise documented below in the visit note. 

## 2013-09-27 NOTE — Addendum Note (Signed)
Addended by: Peggyann Shoals on: 09/27/2013 03:26 PM   Modules accepted: Orders

## 2013-09-27 NOTE — Assessment & Plan Note (Addendum)
Based on appearance decided to tx empirically with antibiotic. Also sending out throat culture.(rapid test neg in office) Pt has 2 young children.

## 2013-09-27 NOTE — Addendum Note (Signed)
Addended by: Ewing Schlein on: 09/27/2013 02:49 PM   Modules accepted: Orders

## 2013-09-27 NOTE — Progress Notes (Signed)
Subjective:    Patient ID: Michele Mcbride, female    DOB: 1986/03/03, 28 y.o.   MRN: [id number]  HPI  Severe sorethroat with fever up to 102. Pt has hd chills and bodyaches. Pt taking ibuprofen around the clock q 8 hrs. Pt dad was sick recently. Pt not aware of dad signs or symptoms. Lt ear throbs also.    Past Medical History  Diagnosis Date  . Sinus infection   . Migraine   . Fibromyalgia   . PCOS (polycystic ovarian syndrome)     History   Social History  . Marital Status: Legally Separated    Spouse Name: N/A    Number of Children: N/A  . Years of Education: N/A   Occupational History  . Not on file.   Social History Main Topics  . Smoking status: Never Smoker   . Smokeless tobacco: Not on file  . Alcohol Use: No  . Drug Use: No  . Sexual Activity: Yes   Other Topics Concern  . Not on file   Social History Narrative  . No narrative on file    Past Surgical History  Procedure Laterality Date  . Wisdom tooth extraction      No family history on file.  No Known Allergies  Current Outpatient Prescriptions on File Prior to Visit  Medication Sig Dispense Refill  . butalbital-acetaminophen-caffeine (FIORICET) 50-325-40 MG per tablet Take 1 tablet by mouth 2 (two) times daily as needed for headache.  40 tablet  2  . fluticasone (FLONASE) 50 MCG/ACT nasal spray Place 1 spray into both nostrils 2 (two) times daily.  16 g  prn  . SUMAtriptan (IMITREX) 100 MG tablet Take 100 mg by mouth every 2 (two) hours as needed for migraine.       . tretinoin (RETIN-A) 0.1 % cream APPLY TO FACE EVERY EVENING  45 g  2   No current facility-administered medications on file prior to visit.    BP 110/76  Pulse 79  Temp(Src) 98.3 F (36.8 C) (Oral)  Wt 149 lb (67.586 kg)  SpO2 98%   Review of Systems  Constitutional: Positive for fever, chills and fatigue.  HENT: Positive for ear pain and sore throat. Negative for congestion, dental problem, mouth sores, nosebleeds,  rhinorrhea, sinus pressure, sneezing, trouble swallowing and voice change.        Pain swollowing.  Respiratory: Negative for cough, chest tightness and wheezing.   Cardiovascular: Negative for chest pain and palpitations.  Gastrointestinal: Negative.   Musculoskeletal: Positive for myalgias.  Neurological: Negative.   Hematological: Positive for adenopathy. Does not bruise/bleed easily.       Objective:   Physical Exam  General  Mental Status - Alert. General Appearance - Well groomed. Not in acute distress.  Skin Rashes- No Rashes.  HEENT Head- Normal. Ear Auditory Canal - Left- Normal. Right - Normal.Tympanic Membrane- Left- Normal. Right- Normal. Eye Sclera/Conjunctiva- Left- Normal. Right- Normal. Nose & Sinuses Nasal Mucosa- Left- Not Boggy or Congested. Right- Not Boggy or Congested. Mouth & Throat Lips: Upper Lip- Normal: no dryness, cracking, pallor, cyanosis, or vesicular eruption. Lower Lip-Normal: no dryness, cracking, pallor, cyanosis or vesicular eruption. Buccal Mucosa- Bilateral- No Aphthous ulcers. Oropharynx- No Discharge or Erythema. Tonsils: Characteristics- Bilateral- moderate  Erythema and Congestion. Size/Enlargement- Bilateral-  2-3+enlargement. Discharge- bilateral-None.  Neck Neck- Supple. No Masses. Left submandibular node mild tender. No nuccal rigidity.   Chest and Lung Exam Auscultation: Breath Sounds:-Normal.  Cardiovascular Auscultation:Rythm- Regular.  Murmurs &  Other Heart Sounds:Ausculatation of the heart reveal- No Murmurs.           Assessment & Plan:

## 2013-09-27 NOTE — Patient Instructions (Signed)
Please take antibiotic that I am sending to your pharmacy. We are sending out throat culture. If symptoms worsen or change notify us. Follow up 7 days pcp or prn   Pharyngitis Pharyngitis is redness, pain, and swelling (inflammation) of your pharynx.  CAUSES  Pharyngitis is usually caused by infection. Most of the time, these infections are from viruses (viral) and are part of a cold. However, sometimes pharyngitis is caused by bacteria (bacterial). Pharyngitis can also be caused by allergies. Viral pharyngitis may be spread from person to person by coughing, sneezing, and personal items or utensils (cups, forks, spoons, toothbrushes). Bacterial pharyngitis may be spread from person to person by more intimate contact, such as kissing.  SIGNS AND SYMPTOMS  Symptoms of pharyngitis include:   Sore throat.   Tiredness (fatigue).   Low-grade fever.   Headache.  Joint pain and muscle aches.  Skin rashes.  Swollen lymph nodes.  Plaque-like film on throat or tonsils (often seen with bacterial pharyngitis). DIAGNOSIS  Your health care provider will ask you questions about your illness and your symptoms. Your medical history, along with a physical exam, is often all that is needed to diagnose pharyngitis. Sometimes, a rapid strep test is done. Other lab tests may also be done, depending on the suspected cause.  TREATMENT  Viral pharyngitis will usually get better in 3-4 days without the use of medicine. Bacterial pharyngitis is treated with medicines that kill germs (antibiotics).  HOME CARE INSTRUCTIONS   Drink enough water and fluids to keep your urine clear or pale yellow.   Only take over-the-counter or prescription medicines as directed by your health care provider:   If you are prescribed antibiotics, make sure you finish them even if you start to feel better.   Do not take aspirin.   Get lots of rest.   Gargle with 8 oz of salt water ( tsp of salt per 1 qt of water) as  often as every 1-2 hours to soothe your throat.   Throat lozenges (if you are not at risk for choking) or sprays may be used to soothe your throat. SEEK MEDICAL CARE IF:   You have large, tender lumps in your neck.  You have a rash.  You cough up green, yellow-brown, or bloody spit. SEEK IMMEDIATE MEDICAL CARE IF:   Your neck becomes stiff.  You drool or are unable to swallow liquids.  You vomit or are unable to keep medicines or liquids down.  You have severe pain that does not go away with the use of recommended medicines.  You have trouble breathing (not caused by a stuffy nose). MAKE SURE YOU:   Understand these instructions.  Will watch your condition.  Will get help right away if you are not doing well or get worse. Document Released: 02/21/2005 Document Revised: 12/12/2012 Document Reviewed: 10/29/2012 [facility] Patient Information 2015 [place], [place]. This information is not intended to replace advice given to you by your health care provider. Make sure you discuss any questions you have with your health care provider.

## 2013-09-29 LAB — CULTURE, GROUP A STREP: ORGANISM ID, BACTERIA: NORMAL

## 2013-10-25 ENCOUNTER — Other Ambulatory Visit: Payer: Self-pay | Admitting: *Deleted

## 2013-10-25 DIAGNOSIS — B379 Candidiasis, unspecified: Secondary | ICD-10-CM

## 2013-10-25 MED ORDER — FLUCONAZOLE 150 MG PO TABS: 150.0000 mg | ORAL_TABLET | ORAL | Status: DC

## 2014-01-06 ENCOUNTER — Encounter: Payer: Self-pay | Admitting: Medical

## 2014-02-10 ENCOUNTER — Emergency Department (HOSPITAL_COMMUNITY)
Admission: EM | Admit: 2014-02-10 | Discharge: 2014-02-10 | Disposition: A | Discharge status: Home / Self Care | Payer: 59 | Source: Home / Self Care | Attending: Emergency Medicine | Admitting: Emergency Medicine

## 2014-02-10 ENCOUNTER — Encounter (HOSPITAL_COMMUNITY): Payer: Self-pay

## 2014-02-10 DIAGNOSIS — L52 Erythema nodosum: Secondary | ICD-10-CM

## 2014-02-10 MED ORDER — PREDNISONE 20 MG PO TABS: ORAL_TABLET | ORAL | Status: DC

## 2014-02-10 MED ORDER — TRIAMCINOLONE ACETONIDE 0.1 % EX CREA: 1.0000 "application " | TOPICAL_CREAM | Freq: Two times a day (BID) | CUTANEOUS | Status: DC

## 2014-02-10 MED ORDER — HYDROXYZINE HCL 25 MG PO TABS: 25.0000 mg | ORAL_TABLET | Freq: Four times a day (QID) | ORAL | Status: DC

## 2014-02-10 NOTE — Discharge Instructions (Signed)
If you are not getting better, see a dermatologist such as Nena Polio at [facility] [phone]).   Stop the tindamax if you are able to.    Rash A rash is a change in the color or feel of your skin. There are many different types of rashes. You may have other problems along with your rash. HOME CARE  Avoid the thing that caused your rash.  Do not scratch your rash.  You may take cools baths to help stop itching.  Only take medicines as told by your doctor.  Keep all doctor visits as told. GET HELP RIGHT AWAY IF:   Your pain, puffiness (swelling), or redness gets worse.  You have a fever.  You have new or severe problems.  You have body aches, watery poop (diarrhea), or you throw up (vomit).  Your rash is not better after 3 days. MAKE SURE YOU:   Understand these instructions.  Will watch your condition.  Will get help right away if you are not doing well or get worse. Document Released: 08/10/2007 Document Revised: 05/16/2011 Document Reviewed: 12/06/2010 [facility] Patient Information 2015 [place], [place]. This information is not intended to replace advice given to you by your health care provider. Make sure you discuss any questions you have with your health care provider.

## 2014-02-10 NOTE — ED Notes (Signed)
Rash onset this AM. Denies  herald spot. NAD

## 2014-02-10 NOTE — ED Provider Notes (Signed)
CSN: [id number]     Arrival date & time 02/10/14  0903 History   First MD Initiated Contact with Patient 02/10/14 [phone]     Chief Complaint  Patient presents with  . Rash   (Consider location/radiation/quality/duration/timing/severity/associated sxs/prior Treatment) HPI Comments: Had similar rash with erythema nodosum 2 years ago. Only new exposure is to med tindamax; pt took her last dose of it today.   Patient is a 28 y.o. female presenting with rash. The history is provided by the patient.  Rash Location:  Full body Quality: itchiness and redness   Severity:  Moderate Onset quality:  Gradual Duration:  2 days Timing:  Constant Progression:  Spreading Chronicity:  New Worsened by:  Nothing tried Ineffective treatments:  Antihistamines (benadryl) Associated symptoms: no fever and no sore throat   Associated symptoms comment:  Swelling in hands   Past Medical History  Diagnosis Date  . Sinus infection   . Migraine   . Fibromyalgia   . PCOS (polycystic ovarian syndrome)    Past Surgical History  Procedure Laterality Date  . Wisdom tooth extraction     History reviewed. No pertinent family history. History  Substance Use Topics  . Smoking status: Never Smoker   . Smokeless tobacco: Not on file  . Alcohol Use: No   OB History    Gravida Para Term Preterm AB TAB SAB Ectopic Multiple Living   2 2 2             Review of Systems  Constitutional: Negative for fever and chills.  HENT: Negative for mouth sores and sore throat.   Skin: Positive for rash.    Allergies  Review of patient's allergies indicates no known allergies.  Home Medications   Prior to Admission medications   Medication Sig Start Date End Date Taking? Authorizing Provider  tinidazole (TINDAMAX) 500 MG tablet Take 1,000 mg by mouth daily with breakfast.   Yes Historical Provider, MD  butalbital-acetaminophen-caffeine (FIORICET) 50-325-40 MG per tablet Take 1 tablet by mouth 2 (two) times daily as  needed for headache. 12/20/12   Shelly Bombard, MD  fluconazole (DIFLUCAN) 150 MG tablet Take 1 tablet (150 mg total) by mouth every other day. 10/25/13   Shelly Bombard, MD  fluticasone (FLONASE) 50 MCG/ACT nasal spray Place 1 spray into both nostrils 2 (two) times daily. 07/25/13   Shelly Bombard, MD  hydrOXYzine (ATARAX/VISTARIL) 25 MG tablet Take 1 tablet (25 mg total) by mouth every 6 (six) hours. 02/10/14   Carvel Getting, NP  predniSONE (DELTASONE) 20 MG tablet Take 60mg  qd x5 days; 40mg  qd x5 days; 20mg  qd x 5 days 02/10/14   Carvel Getting, NP  SUMAtriptan (IMITREX) 100 MG tablet Take 100 mg by mouth every 2 (two) hours as needed for migraine.     Historical Provider, MD  triamcinolone cream (KENALOG) 0.1 % Apply 1 application topically 2 (two) times daily. 02/10/14   Carvel Getting, NP   BP 105/78 mmHg  Pulse 77  Temp(Src) 99 F (37.2 C) (Oral)  Resp 16  SpO2 100% Physical Exam  Constitutional: She appears well-developed and well-nourished. No distress.  HENT:  Mouth/Throat: Oropharynx is clear and moist.  Pulmonary/Chest: Effort normal.  Lymphadenopathy:       Head (right side): No submental, no submandibular, no tonsillar, no preauricular and no posterior auricular adenopathy present.       Head (left side): No submental, no submandibular, no tonsillar, no preauricular and no posterior auricular adenopathy  present.    She has no cervical adenopathy.  Skin: Skin is warm and dry. Rash noted. Rash is macular.  Erythematous patches scattered on body: B eyelids, B knees, R elbow, periumbilical, lower abdomen, lower back, B ears.     ED Course  Procedures (including critical care time) Labs Review Labs Reviewed - No data to display  Imaging Review No results found.  Dr. Jake Michaelis examined pt with me.   MDM   1. Erythema nodosum   rx prednisone 60mg  qd x 5d, 40mg  qd x5d, 20mg  qd x5. Rx triamcinolone 0.1% cream apply BID. Rx atarax 25mg  q6hrs prn itching #30. If not getting  better, f/u with dermatology.     Carvel Getting, NP 02/10/14 1055

## 2014-02-17 ENCOUNTER — Telehealth: Payer: Self-pay | Admitting: *Deleted

## 2014-02-17 DIAGNOSIS — B3731 Acute candidiasis of vulva and vagina: Secondary | ICD-10-CM

## 2014-02-17 DIAGNOSIS — B373 Candidiasis of vulva and vagina: Secondary | ICD-10-CM

## 2014-02-17 MED ORDER — FLUCONAZOLE 150 MG PO TABS: 150.0000 mg | ORAL_TABLET | ORAL | Status: DC

## 2014-02-17 NOTE — Telephone Encounter (Signed)
Patient states she has been Rx'd prednisone for a skin condition. She now has developed yeast and is requesting treatment. Rx for Diflucan sent to pharmacy per provider permission.

## 2014-02-24 ENCOUNTER — Other Ambulatory Visit: Payer: Self-pay | Admitting: *Deleted

## 2014-02-24 MED ORDER — OSELTAMIVIR PHOSPHATE 75 MG PO CAPS: 75.0000 mg | ORAL_CAPSULE | Freq: Two times a day (BID) | ORAL | Status: DC

## 2014-03-03 ENCOUNTER — Encounter: Payer: Self-pay | Admitting: *Deleted

## 2014-03-04 ENCOUNTER — Other Ambulatory Visit: Payer: Self-pay | Admitting: *Deleted

## 2014-03-04 ENCOUNTER — Encounter: Payer: Self-pay | Admitting: Obstetrics & Gynecology

## 2014-03-06 ENCOUNTER — Encounter: Payer: Self-pay | Admitting: Obstetrics & Gynecology

## 2014-03-06 ENCOUNTER — Ambulatory Visit (INDEPENDENT_AMBULATORY_CARE_PROVIDER_SITE_OTHER): Payer: 59 | Admitting: Obstetrics & Gynecology

## 2014-03-06 VITALS — BP 100/75 | HR 76 | Temp 98.7°F | Ht 62.0 in | Wt 148.0 lb

## 2014-03-06 DIAGNOSIS — Z01419 Encounter for gynecological examination (general) (routine) without abnormal findings: Secondary | ICD-10-CM

## 2014-03-06 LAB — RPR

## 2014-03-06 NOTE — Progress Notes (Signed)
Subjective:     Michele SARKISYAN is a 28 y.o. female here for a routine exam.    Personal health questionnaire:  Is patient Ashkenazi Jewish, have a family history of breast and/or ovarian cancer: no Is there a family history of uterine cancer diagnosed at age < 31, gastrointestinal cancer, urinary tract cancer, family member who is a Field seismologist syndrome-associated carrier: no Is the patient overweight and hypertensive, family history of diabetes, personal history of gestational diabetes or PCOS: yes Is patient over 40, have PCOS,  family history of premature CHD under age 62, diabetes, smoke, have hypertension or peripheral artery disease:  no At any time, has a partner hit, kicked or otherwise hurt or frightened you?: no Over the past 2 weeks, have you felt down, depressed or hopeless?: no Over the past 2 weeks, have you felt little interest or pleasure in doing things?:no   Gynecologic History No LMP recorded. Patient is not currently having periods (Reason: IUD). Contraception: IUD Last Pap results were: normal   Obstetric History OB History  Gravida Para Term Preterm AB SAB TAB Ectopic Multiple Living  2 2 2            # Outcome Date GA Lbr Len/2nd Weight Sex Delivery Anes PTL Lv  2 Term           1 Term               Past Medical History  Diagnosis Date  . Sinus infection   . Migraine   . Fibromyalgia   . PCOS (polycystic ovarian syndrome)     Past Surgical History  Procedure Laterality Date  . Wisdom tooth extraction      Current outpatient prescriptions: butalbital-acetaminophen-caffeine (FIORICET) 50-325-40 MG per tablet, Take 1 tablet by mouth 2 (two) times daily as needed for headache., Disp: 40 tablet, Rfl: 2;  fluticasone (FLONASE) 50 MCG/ACT nasal spray, Place 1 spray into both nostrils 2 (two) times daily., Disp: 16 g, Rfl: prn;  SUMAtriptan (IMITREX) 100 MG tablet, Take 100 mg by mouth every 2 (two) hours as needed for migraine. , Disp: , Rfl:  predniSONE  (DELTASONE) 20 MG tablet, Take 60mg  qd x5 days; 40mg  qd x5 days; 20mg  qd x 5 days, Disp: 30 tablet, Rfl: 0 Allergies  Allergen Reactions  . Strawberry Anaphylaxis    History  Substance Use Topics  . Smoking status: Never Smoker   . Smokeless tobacco: Not on file  . Alcohol Use: No    Family History  Problem Relation Age of Onset  . Hyperlipidemia Mother       Review of Systems  Constitutional: negative for fatigue and weight loss Respiratory: negative for cough and wheezing Cardiovascular: negative for chest pain, fatigue and palpitations Gastrointestinal: negative for abdominal pain and change in bowel habits Musculoskeletal:negative for myalgias Neurological: negative for gait problems and tremors Behavioral/Psych: negative for abusive relationship, depression Endocrine: negative for temperature intolerance   Genitourinary:negative for abnormal menstrual periods, genital lesions, hot flashes, sexual problems and vaginal discharge Integument/breast: negative for breast lump, breast tenderness, nipple discharge and skin lesion(s)    Objective:       BP 100/75 mmHg  Pulse 76  Temp(Src) 98.7 F (37.1 C)  Ht 5\' 2"  (1.575 m)  Wt 67.132 kg (148 lb)  BMI 27.06 kg/m2 General:   alert  Skin:   no rash or abnormalities  Lungs:   clear to auscultation bilaterally  Heart:   regular rate and rhythm, S1, S2 normal, no  murmur, click, rub or gallop  Breasts:   normal without suspicious masses, skin or nipple changes or axillary nodes  Abdomen:  normal findings: no organomegaly, soft, non-tender and no hernia  Pelvis:  External genitalia: normal general appearance Urinary system: urethral meatus normal and bladder without fullness, nontender Vaginal: normal without tenderness, induration or masses Cervix: normal appearance; IUD strings present Adnexa: normal bimanual exam Uterus: anteverted and non-tender, normal size   Lab Review Labs reviewed yes Radiologic studies reviewed  no     Assessment:    Healthy female exam.  H/O hirsutism on spironolactone  Plan:    Education reviewed: calcium supplements, low fat, low cholesterol diet, safe sex/STD prevention and weight bearing exercise.    Orders Placed This Encounter  Procedures  . HIV antibody  . RPR  . Basic Metabolic Panel (BMET)    Follow up as needed.

## 2014-03-07 LAB — BASIC METABOLIC PANEL
BUN: 13 mg/dL (ref 6–23)
CALCIUM: 9.2 mg/dL (ref 8.4–10.5)
CO2: 20 mEq/L (ref 19–32)
Chloride: 104 mEq/L (ref 96–112)
Creat: 1.08 mg/dL (ref 0.50–1.10)
Glucose, Bld: 84 mg/dL (ref 70–99)
Potassium: 4.3 mEq/L (ref 3.5–5.3)
Sodium: 143 mEq/L (ref 135–145)

## 2014-03-07 LAB — HIV ANTIBODY (ROUTINE TESTING W REFLEX): HIV 1&2 Ab, 4th Generation: NONREACTIVE

## 2014-03-11 LAB — PAP IG AND HPV HIGH-RISK: HPV DNA High Risk: DETECTED — AB

## 2014-03-13 NOTE — Patient Instructions (Signed)

## 2014-03-17 ENCOUNTER — Encounter: Payer: Self-pay | Admitting: Obstetrics & Gynecology

## 2014-03-17 DIAGNOSIS — R87612 Low grade squamous intraepithelial lesion on cytologic smear of cervix (LGSIL): Secondary | ICD-10-CM | POA: Insufficient documentation

## 2014-04-03 ENCOUNTER — Other Ambulatory Visit: Payer: 59

## 2014-04-03 DIAGNOSIS — B373 Candidiasis of vulva and vagina: Secondary | ICD-10-CM

## 2014-04-03 DIAGNOSIS — B3731 Acute candidiasis of vulva and vagina: Secondary | ICD-10-CM

## 2014-04-07 LAB — SURESWAB, VAGINOSIS/VAGINITIS PLUS
Atopobium vaginae: 6.9 Log (cells/mL)
BV CATEGORY: UNDETERMINED — AB
C. GLABRATA, DNA: NOT DETECTED
C. TROPICALIS, DNA: NOT DETECTED
C. albicans, DNA: NOT DETECTED
C. parapsilosis, DNA: NOT DETECTED
C. trachomatis RNA, TMA: NOT DETECTED
Gardnerella vaginalis: >8 Log (cells/mL)
LACTOBACILLUS SPECIES: 5 Log (cells/mL)
MEGASPHAERA SPECIES: 7.6 Log (cells/mL)
N. gonorrhoeae RNA, TMA: NOT DETECTED
T. vaginalis RNA, QL TMA: NOT DETECTED

## 2014-04-08 ENCOUNTER — Other Ambulatory Visit: Payer: Self-pay | Admitting: *Deleted

## 2014-04-08 ENCOUNTER — Other Ambulatory Visit: Payer: Self-pay | Admitting: Obstetrics

## 2014-04-08 DIAGNOSIS — N760 Acute vaginitis: Principal | ICD-10-CM

## 2014-04-08 DIAGNOSIS — B9689 Other specified bacterial agents as the cause of diseases classified elsewhere: Secondary | ICD-10-CM

## 2014-04-08 MED ORDER — TINIDAZOLE 500 MG PO TABS: 1000.0000 mg | ORAL_TABLET | Freq: Every day | ORAL | Status: DC

## 2014-04-08 MED ORDER — METRONIDAZOLE 500 MG PO TABS: 500.0000 mg | ORAL_TABLET | Freq: Two times a day (BID) | ORAL | Status: DC

## 2014-04-08 NOTE — Progress Notes (Signed)
Request to change medication 

## 2014-04-17 ENCOUNTER — Other Ambulatory Visit: Payer: Self-pay | Admitting: *Deleted

## 2014-04-17 DIAGNOSIS — J029 Acute pharyngitis, unspecified: Secondary | ICD-10-CM

## 2014-04-17 MED ORDER — MAGIC MOUTHWASH W/LIDOCAINE: 5.0000 mL | Freq: Four times a day (QID) | ORAL | Status: DC | PRN

## 2014-05-13 ENCOUNTER — Telehealth: Payer: Self-pay | Admitting: *Deleted

## 2014-05-13 DIAGNOSIS — B3731 Acute candidiasis of vulva and vagina: Secondary | ICD-10-CM

## 2014-05-13 DIAGNOSIS — B373 Candidiasis of vulva and vagina: Secondary | ICD-10-CM

## 2014-05-13 MED ORDER — FLUCONAZOLE 150 MG PO TABS: 150.0000 mg | ORAL_TABLET | ORAL | Status: DC

## 2014-05-13 NOTE — Telephone Encounter (Signed)
Patient complains of yeast infection and is requesting treatment. Ok to call Diflucan per Dr Jodi Mourning.

## 2014-05-19 ENCOUNTER — Other Ambulatory Visit: Payer: Self-pay | Admitting: Obstetrics

## 2014-05-19 DIAGNOSIS — M545 Low back pain, unspecified: Secondary | ICD-10-CM

## 2014-05-19 MED ORDER — IBUPROFEN 800 MG PO TABS: 800.0000 mg | ORAL_TABLET | Freq: Three times a day (TID) | ORAL | Status: DC | PRN

## 2014-05-19 MED ORDER — METHOCARBAMOL 500 MG PO TABS: 500.0000 mg | ORAL_TABLET | Freq: Three times a day (TID) | ORAL | Status: DC | PRN

## 2014-05-19 MED ORDER — CYCLOBENZAPRINE HCL 10 MG PO TABS: 10.0000 mg | ORAL_TABLET | Freq: Three times a day (TID) | ORAL | Status: DC | PRN

## 2014-06-20 ENCOUNTER — Other Ambulatory Visit: Payer: Self-pay | Admitting: Obstetrics

## 2014-06-20 DIAGNOSIS — J302 Other seasonal allergic rhinitis: Secondary | ICD-10-CM

## 2014-06-20 MED ORDER — LEVOCETIRIZINE DIHYDROCHLORIDE 5 MG PO TABS: 5.0000 mg | ORAL_TABLET | Freq: Every evening | ORAL | Status: DC

## 2014-06-30 ENCOUNTER — Other Ambulatory Visit: Payer: Self-pay | Admitting: Obstetrics

## 2014-06-30 DIAGNOSIS — B373 Candidiasis of vulva and vagina: Secondary | ICD-10-CM

## 2014-06-30 DIAGNOSIS — B3731 Acute candidiasis of vulva and vagina: Secondary | ICD-10-CM

## 2014-06-30 DIAGNOSIS — B9689 Other specified bacterial agents as the cause of diseases classified elsewhere: Secondary | ICD-10-CM

## 2014-06-30 DIAGNOSIS — N760 Acute vaginitis: Principal | ICD-10-CM

## 2014-06-30 MED ORDER — METRONIDAZOLE 500 MG PO TABS: 500.0000 mg | ORAL_TABLET | Freq: Two times a day (BID) | ORAL | Status: DC

## 2014-06-30 MED ORDER — FLUCONAZOLE 150 MG PO TABS: 150.0000 mg | ORAL_TABLET | ORAL | Status: DC

## 2014-08-08 ENCOUNTER — Other Ambulatory Visit: Payer: Self-pay | Admitting: *Deleted

## 2014-08-08 MED ORDER — EPINEPHRINE 0.3 MG/0.3ML IJ SOAJ: 0.3000 mg | Freq: Once | INTRAMUSCULAR | Status: DC

## 2014-09-24 ENCOUNTER — Other Ambulatory Visit: Payer: Self-pay | Admitting: *Deleted

## 2014-09-24 MED ORDER — TRETINOIN 0.1 % EX CREA: TOPICAL_CREAM | CUTANEOUS | Status: DC

## 2014-09-24 NOTE — Progress Notes (Signed)
Pt contacted office for refill on Retin-A cream.  Per Dr Jodi Mourning, ok to send. Refill reordered at pharmacy.

## 2014-11-05 ENCOUNTER — Other Ambulatory Visit: Payer: Self-pay | Admitting: *Deleted

## 2014-11-05 DIAGNOSIS — G44221 Chronic tension-type headache, intractable: Secondary | ICD-10-CM

## 2014-11-05 MED ORDER — TOPIRAMATE 100 MG PO TABS: 100.0000 mg | ORAL_TABLET | Freq: Every day | ORAL | Status: DC

## 2014-11-17 ENCOUNTER — Other Ambulatory Visit: Payer: 59

## 2014-11-17 DIAGNOSIS — Z113 Encounter for screening for infections with a predominantly sexual mode of transmission: Secondary | ICD-10-CM

## 2014-11-18 LAB — HIV ANTIBODY (ROUTINE TESTING W REFLEX): HIV: NONREACTIVE

## 2014-11-18 LAB — RPR

## 2014-11-18 LAB — HEPATITIS B SURFACE ANTIGEN: Hepatitis B Surface Ag: NEGATIVE

## 2014-11-18 LAB — HEPATITIS C ANTIBODY: HCV AB: NEGATIVE

## 2014-11-20 ENCOUNTER — Other Ambulatory Visit: Payer: Self-pay | Admitting: Obstetrics

## 2014-11-20 DIAGNOSIS — J069 Acute upper respiratory infection, unspecified: Secondary | ICD-10-CM

## 2014-11-20 DIAGNOSIS — R059 Cough, unspecified: Secondary | ICD-10-CM

## 2014-11-20 DIAGNOSIS — R05 Cough: Secondary | ICD-10-CM

## 2014-11-20 LAB — SURESWAB, VAGINOSIS/VAGINITIS PLUS
ATOPOBIUM VAGINAE: NOT DETECTED Log (cells/mL)
C. albicans, DNA: NOT DETECTED
C. glabrata, DNA: NOT DETECTED
C. parapsilosis, DNA: NOT DETECTED
C. trachomatis RNA, TMA: NOT DETECTED
C. tropicalis, DNA: NOT DETECTED
LACTOBACILLUS SPECIES: 7.4 Log (cells/mL)
MEGASPHAERA SPECIES: NOT DETECTED Log (cells/mL)
N. GONORRHOEAE RNA, TMA: DETECTED — AB
T. vaginalis RNA, QL TMA: NOT DETECTED

## 2014-11-20 MED ORDER — AZITHROMYCIN 250 MG PO TABS
ORAL_TABLET | ORAL | Status: DC
Start: 2014-11-20 — End: 2015-02-12

## 2014-11-20 MED ORDER — GUAIFENESIN-CODEINE 100-10 MG/5ML PO SYRP
ORAL_SOLUTION | ORAL | Status: DC
Start: 2014-11-20 — End: 2015-12-16

## 2014-11-21 ENCOUNTER — Other Ambulatory Visit: Payer: Self-pay | Admitting: *Deleted

## 2014-11-21 ENCOUNTER — Other Ambulatory Visit: Payer: Self-pay | Admitting: Obstetrics

## 2014-11-21 DIAGNOSIS — A64 Unspecified sexually transmitted disease: Secondary | ICD-10-CM

## 2014-11-21 MED ORDER — CEFTRIAXONE SODIUM 1 G IJ SOLR
250.0000 mg | Freq: Once | INTRAMUSCULAR | Status: AC
  Administered 2014-11-21: 250 mg via INTRAMUSCULAR

## 2014-11-26 ENCOUNTER — Other Ambulatory Visit: Payer: Self-pay | Admitting: Obstetrics

## 2014-11-26 DIAGNOSIS — T753XXA Motion sickness, initial encounter: Secondary | ICD-10-CM

## 2014-11-26 MED ORDER — SCOPOLAMINE 1 MG/3DAYS TD PT72: 1.0000 | MEDICATED_PATCH | TRANSDERMAL | Status: DC

## 2014-11-27 ENCOUNTER — Other Ambulatory Visit: Payer: Self-pay | Admitting: *Deleted

## 2014-11-27 DIAGNOSIS — H6091 Unspecified otitis externa, right ear: Secondary | ICD-10-CM

## 2014-11-27 DIAGNOSIS — B3731 Acute candidiasis of vulva and vagina: Secondary | ICD-10-CM

## 2014-11-27 DIAGNOSIS — B373 Candidiasis of vulva and vagina: Secondary | ICD-10-CM

## 2014-11-27 MED ORDER — NITROFURANTOIN MONOHYD MACRO 100 MG PO CAPS: 100.0000 mg | ORAL_CAPSULE | Freq: Two times a day (BID) | ORAL | Status: DC

## 2014-11-27 MED ORDER — CIPROFLOXACIN-DEXAMETHASONE 0.3-0.1 % OT SUSP: 4.0000 [drp] | Freq: Two times a day (BID) | OTIC | Status: DC

## 2014-11-27 MED ORDER — FLUCONAZOLE 150 MG PO TABS: 150.0000 mg | ORAL_TABLET | ORAL | Status: DC

## 2015-01-19 ENCOUNTER — Other Ambulatory Visit: Payer: 59

## 2015-01-19 DIAGNOSIS — R399 Unspecified symptoms and signs involving the genitourinary system: Secondary | ICD-10-CM

## 2015-01-20 LAB — URINE CULTURE
Colony Count: NO GROWTH
ORGANISM ID, BACTERIA: NO GROWTH

## 2015-01-23 ENCOUNTER — Other Ambulatory Visit: Payer: Self-pay | Admitting: Obstetrics

## 2015-01-23 DIAGNOSIS — L309 Dermatitis, unspecified: Secondary | ICD-10-CM

## 2015-01-23 MED ORDER — PREDNISONE 10 MG (21) PO TBPK: ORAL_TABLET | ORAL | Status: DC

## 2015-02-12 ENCOUNTER — Other Ambulatory Visit: Payer: Self-pay | Admitting: Obstetrics

## 2015-02-12 DIAGNOSIS — J069 Acute upper respiratory infection, unspecified: Secondary | ICD-10-CM

## 2015-02-12 DIAGNOSIS — J3089 Other allergic rhinitis: Secondary | ICD-10-CM

## 2015-02-12 MED ORDER — FLUTICASONE PROPIONATE 50 MCG/ACT NA SUSP: 1.0000 | Freq: Two times a day (BID) | NASAL | Status: DC

## 2015-02-12 MED ORDER — AZITHROMYCIN 250 MG PO TABS: ORAL_TABLET | ORAL | Status: DC

## 2015-03-11 MED FILL — AZITHROMYCIN 250 MG TABLET: 250 | 5 days supply | Qty: 6 | Fill #1

## 2015-03-12 ENCOUNTER — Other Ambulatory Visit: Payer: Self-pay | Admitting: Obstetrics

## 2015-03-12 ENCOUNTER — Other Ambulatory Visit: Payer: Self-pay | Admitting: Certified Nurse Midwife

## 2015-03-12 DIAGNOSIS — H6691 Otitis media, unspecified, right ear: Secondary | ICD-10-CM

## 2015-03-12 DIAGNOSIS — H6093 Unspecified otitis externa, bilateral: Secondary | ICD-10-CM

## 2015-03-12 MED ORDER — OFLOXACIN 0.3 % OT SOLN: 5.0000 [drp] | Freq: Two times a day (BID) | OTIC | Status: DC

## 2015-03-12 MED ORDER — AMOXICILLIN-POT CLAVULANATE 875-125 MG PO TABS: 1.0000 | ORAL_TABLET | Freq: Two times a day (BID) | ORAL | Status: DC

## 2015-03-12 MED ORDER — NEOMYCIN-POLYMYXIN-HC 3.5-10000-1 OT SOLN: 4.0000 [drp] | Freq: Three times a day (TID) | OTIC | Status: DC

## 2015-03-12 MED FILL — NEO/POLYMYXIN/HC EAR SOLN: 3.5-10000-1 | 8 days supply | Qty: 10 | Fill #0

## 2015-03-12 MED FILL — AMOX-CLAV 875-125 MG TABLET: 875-125 | 7 days supply | Qty: 14 | Fill #0

## 2015-03-13 DIAGNOSIS — Z01419 Encounter for gynecological examination (general) (routine) without abnormal findings: Secondary | ICD-10-CM | POA: Diagnosis not present

## 2015-03-13 DIAGNOSIS — R87612 Low grade squamous intraepithelial lesion on cytologic smear of cervix (LGSIL): Secondary | ICD-10-CM | POA: Diagnosis not present

## 2015-03-13 DIAGNOSIS — Z8742 Personal history of other diseases of the female genital tract: Secondary | ICD-10-CM | POA: Diagnosis not present

## 2015-03-13 DIAGNOSIS — Z30431 Encounter for routine checking of intrauterine contraceptive device: Secondary | ICD-10-CM | POA: Diagnosis not present

## 2015-03-13 MED FILL — OFLOXACIN 0.3% EAR DROPS: 0.3 | 10 days supply | Qty: 5 | Fill #0

## 2015-03-17 ENCOUNTER — Other Ambulatory Visit: Payer: Self-pay

## 2015-03-17 ENCOUNTER — Other Ambulatory Visit: Payer: Self-pay | Admitting: *Deleted

## 2015-03-17 DIAGNOSIS — Z30431 Encounter for routine checking of intrauterine contraceptive device: Secondary | ICD-10-CM

## 2015-03-17 LAB — BASIC METABOLIC PANEL
BUN: 11 mg/dL (ref 7–25)
CALCIUM: 9.5 mg/dL (ref 8.6–10.2)
CHLORIDE: 108 mmol/L (ref 98–110)
CO2: 23 mmol/L (ref 20–31)
CREATININE: 0.91 mg/dL (ref 0.50–1.10)
Glucose, Bld: 100 mg/dL — ABNORMAL HIGH (ref 65–99)
Potassium: 3.7 mmol/L (ref 3.5–5.3)
Sodium: 144 mmol/L (ref 135–146)

## 2015-03-19 ENCOUNTER — Ambulatory Visit (INDEPENDENT_AMBULATORY_CARE_PROVIDER_SITE_OTHER): Payer: 59

## 2015-03-19 DIAGNOSIS — Z30431 Encounter for routine checking of intrauterine contraceptive device: Secondary | ICD-10-CM | POA: Diagnosis not present

## 2015-03-30 LAB — HM PAP SMEAR

## 2015-04-10 DIAGNOSIS — R87612 Low grade squamous intraepithelial lesion on cytologic smear of cervix (LGSIL): Secondary | ICD-10-CM | POA: Diagnosis not present

## 2015-04-10 DIAGNOSIS — R87810 Cervical high risk human papillomavirus (HPV) DNA test positive: Secondary | ICD-10-CM | POA: Diagnosis not present

## 2015-04-10 DIAGNOSIS — Z30432 Encounter for removal of intrauterine contraceptive device: Secondary | ICD-10-CM | POA: Diagnosis not present

## 2015-04-17 MED FILL — SPIRONOLACTONE 100 MG TAB: 100 | 90 days supply | Qty: 90 | Fill #2

## 2015-04-17 MED FILL — TOPIRAMATE 100 MG TABLET: 100 | 90 days supply | Qty: 90 | Fill #1

## 2015-04-17 MED FILL — LEVOCETIRIZINE 5 MG TABLET: 5 | 30 days supply | Qty: 30 | Fill #6

## 2015-05-13 ENCOUNTER — Other Ambulatory Visit: Payer: Self-pay | Admitting: Obstetrics

## 2015-05-13 DIAGNOSIS — G43001 Migraine without aura, not intractable, with status migrainosus: Secondary | ICD-10-CM

## 2015-05-13 MED ORDER — BUTALBITAL-APAP-CAFFEINE 50-325-40 MG PO TABS: 1.0000 | ORAL_TABLET | Freq: Two times a day (BID) | ORAL | Status: DC | PRN

## 2015-05-15 ENCOUNTER — Other Ambulatory Visit: Payer: Self-pay | Admitting: Obstetrics

## 2015-05-15 DIAGNOSIS — J069 Acute upper respiratory infection, unspecified: Secondary | ICD-10-CM

## 2015-05-15 MED ORDER — AZITHROMYCIN 250 MG PO TABS: ORAL_TABLET | ORAL | Status: DC

## 2015-05-15 MED FILL — AZITHROMYCIN 250 MG TABLET: 250 | 5 days supply | Qty: 6 | Fill #0

## 2015-05-22 MED FILL — BUTALB-ACETAMIN-CAFF 50-325: 50-325-40 | 20 days supply | Qty: 40 | Fill #0

## 2015-05-22 MED FILL — LEVOCETIRIZINE 5 MG TABLET: 5 | 30 days supply | Qty: 30 | Fill #7

## 2015-06-17 MED FILL — AZITHROMYCIN 250 MG TABLET: 250 | 5 days supply | Qty: 6 | Fill #1

## 2015-07-10 DIAGNOSIS — Z91018 Allergy to other foods: Secondary | ICD-10-CM | POA: Diagnosis not present

## 2015-07-10 DIAGNOSIS — H1045 Other chronic allergic conjunctivitis: Secondary | ICD-10-CM | POA: Diagnosis not present

## 2015-07-10 DIAGNOSIS — R05 Cough: Secondary | ICD-10-CM | POA: Diagnosis not present

## 2015-07-10 DIAGNOSIS — J301 Allergic rhinitis due to pollen: Secondary | ICD-10-CM | POA: Diagnosis not present

## 2015-07-10 MED FILL — PROAIR RESPICLICK INHAL PWD: 108 (90 BAS | 30 days supply | Qty: 1 | Fill #0

## 2015-07-10 MED FILL — EPINEPHRINE 0.3 MG AUTO-INJ: 0.3 | 30 days supply | Qty: 2 | Fill #0

## 2015-07-10 MED FILL — DESLORATADINE 5 MG TABLET: 5 | 30 days supply | Qty: 30 | Fill #0

## 2015-09-02 ENCOUNTER — Other Ambulatory Visit: Payer: Self-pay | Admitting: Obstetrics

## 2015-09-18 MED FILL — SPIRONOLACTONE 100 MG TAB: 100 | 90 days supply | Qty: 90 | Fill #0

## 2015-09-23 MED FILL — TOPIRAMATE 100 MG TABLET: 100 | 90 days supply | Qty: 90 | Fill #2

## 2015-09-23 MED FILL — DESLORATADINE 5 MG TABLET: 5 | 30 days supply | Qty: 30 | Fill #1

## 2015-09-23 MED FILL — BUTALB-ACETAMIN-CAFF 50-325: 50-325-40 | 20 days supply | Qty: 40 | Fill #1

## 2015-10-20 ENCOUNTER — Ambulatory Visit (HOSPITAL_COMMUNITY)
Admission: EM | Admit: 2015-10-20 | Discharge: 2015-10-20 | Disposition: A | Discharge status: Home / Self Care | Payer: 59 | Attending: Family Medicine | Admitting: Family Medicine

## 2015-10-20 ENCOUNTER — Ambulatory Visit (INDEPENDENT_AMBULATORY_CARE_PROVIDER_SITE_OTHER): Payer: 59

## 2015-10-20 ENCOUNTER — Encounter (HOSPITAL_COMMUNITY): Payer: Self-pay | Admitting: *Deleted

## 2015-10-20 DIAGNOSIS — M549 Dorsalgia, unspecified: Secondary | ICD-10-CM | POA: Diagnosis not present

## 2015-10-20 DIAGNOSIS — M542 Cervicalgia: Secondary | ICD-10-CM

## 2015-10-20 DIAGNOSIS — M79631 Pain in right forearm: Secondary | ICD-10-CM | POA: Diagnosis not present

## 2015-10-20 MED ORDER — HYDROCODONE-ACETAMINOPHEN 5-325 MG PO TABS: 1.0000 | ORAL_TABLET | ORAL | 0 refills | Status: DC | PRN
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
## 2015-10-21 MED FILL — HYDROCODON-APAP 5-325: 5-325 | 2 days supply | Qty: 20 | Fill #0

## 2015-11-13 MED FILL — DESLORATADINE 5 MG TABLET: 5 | 30 days supply | Qty: 30 | Fill #2

## 2015-11-13 MED FILL — NITROFURANTOIN MONO-MCR 100: 100 | 7 days supply | Qty: 14 | Fill #1

## 2015-12-16 ENCOUNTER — Encounter: Payer: Self-pay | Admitting: Family Medicine

## 2015-12-16 ENCOUNTER — Ambulatory Visit (INDEPENDENT_AMBULATORY_CARE_PROVIDER_SITE_OTHER): Payer: 59 | Admitting: Family Medicine

## 2015-12-16 VITALS — BP 98/74 | HR 91 | Temp 98.1°F | Resp 17 | Ht 62.0 in | Wt 122.0 lb

## 2015-12-16 DIAGNOSIS — G43711 Chronic migraine without aura, intractable, with status migrainosus: Secondary | ICD-10-CM

## 2015-12-16 DIAGNOSIS — G44221 Chronic tension-type headache, intractable: Secondary | ICD-10-CM | POA: Diagnosis not present

## 2015-12-16 DIAGNOSIS — G43001 Migraine without aura, not intractable, with status migrainosus: Secondary | ICD-10-CM | POA: Diagnosis not present

## 2015-12-16 MED ORDER — KETOROLAC TROMETHAMINE 60 MG/2ML IM SOLN
60.0000 mg | Freq: Once | INTRAMUSCULAR | Status: AC
  Administered 2015-12-16: 60 mg via INTRAMUSCULAR

## 2015-12-16 MED ORDER — ONDANSETRON HCL 4 MG PO TABS: 4.0000 mg | ORAL_TABLET | Freq: Three times a day (TID) | ORAL | 1 refills | Status: DC | PRN

## 2015-12-16 MED ORDER — SUMATRIPTAN SUCCINATE 100 MG PO TABS: 100.0000 mg | ORAL_TABLET | ORAL | 6 refills | Status: DC | PRN

## 2015-12-16 MED ORDER — ONDANSETRON HCL 4 MG/2ML IJ SOLN
4.0000 mg | Freq: Once | INTRAMUSCULAR | Status: AC
  Administered 2015-12-16: 4 mg via INTRAMUSCULAR

## 2015-12-16 MED ORDER — TOPIRAMATE 100 MG PO TABS: 100.0000 mg | ORAL_TABLET | Freq: Every day | ORAL | 1 refills | Status: DC

## 2015-12-16 MED ORDER — BUTALBITAL-APAP-CAFFEINE 50-325-40 MG PO TABS: 1.0000 | ORAL_TABLET | Freq: Two times a day (BID) | ORAL | 2 refills | Status: DC | PRN

## 2015-12-16 MED FILL — BUTALB-ACETAMIN-CAFF 50-325: 50-325-40 | 23 days supply | Qty: 45 | Fill #0

## 2015-12-16 MED FILL — ONDANSETRON HCL 4 MG TABLET: 4 | 30 days supply | Qty: 30 | Fill #0

## 2015-12-16 MED FILL — TOPIRAMATE 100 MG TABLET: 100 | 90 days supply | Qty: 90 | Fill #0

## 2015-12-16 MED FILL — SUMATRIPTAN SUCC 100 MG TAB: 100 | 30 days supply | Qty: 9 | Fill #0

## 2015-12-16 NOTE — Patient Instructions (Signed)
Schedule your complete physical in 3-4 months I'll complete your FMLA forms and we will fax them for you If you continue to have increased frequency of migraines- let me know and we'll refer you to a new neurologist Use the Zofran as needed for nausea Drink plenty of fluids Call with any questions or concerns Hang in there!!!

## 2015-12-16 NOTE — Progress Notes (Signed)
   Subjective:    Patient ID: Michele Mcbride, female    DOB: 1985/07/09, 30 y.o.   MRN: [id number]  HPI Migraines- chronic problem.  Pt was dx'd in childhood.  Was seeing neuro, Dr Angelina Sheriff, [facility]) but he retired.  Pt has had increased frequency of migraines recently but she feels this may be stress or weather related.  Pt needs FMLA forms completed- up to 3 days/month and then a doctor's visit is needed.  Pt needs refill of Topamax, Imitrex, Fioricet.  Pt is also asking for nausea medication- pt prefers Zofran.  Pt has had current migraine x3 days and having nausea at this time.   Review of Systems For ROS see HPI     Objective:   Physical Exam  Constitutional: She is oriented to person, place, and time. She appears well-developed and well-nourished. No distress.  HENT:  Head: Normocephalic and atraumatic.  TMs WNL No TTP over sinuses Minimal nasal congestion  Eyes: Conjunctivae and EOM are normal. Pupils are equal, round, and reactive to light.  Neck: Normal range of motion. Neck supple.  Cardiovascular: Normal rate, regular rhythm, normal heart sounds and intact distal pulses.   Pulmonary/Chest: Effort normal and breath sounds normal. No respiratory distress. She has no wheezes. She has no rales.  Lymphadenopathy:    She has no cervical adenopathy.  Neurological: She is alert and oriented to person, place, and time. She has normal reflexes. No cranial nerve deficit. Coordination normal.  Psychiatric: She has a normal mood and affect. Her behavior is normal. Judgment and thought content normal.  Vitals reviewed.         Assessment & Plan:

## 2015-12-16 NOTE — Progress Notes (Signed)
Pre visit review using our clinic review tool, if applicable. No additional management support is needed unless otherwise documented below in the visit note. 

## 2015-12-16 NOTE — Assessment & Plan Note (Signed)
Deteriorated.  Pt has had current migraine x3 days and reports they have been occurring more frequently.  Will give injxn of Toradol and Zofran in office to break current HA.  Refills provided on Topamax, Fioricet, Imitrex.  Zofran added for PRN use.  Will modify her FMLA to reflect increased frequency.  Pt expressed understanding and is in agreement w/ plan.

## 2015-12-16 NOTE — Addendum Note (Signed)
Addended by: Davis Gourd on: 12/16/2015 03:54 PM   Modules accepted: Orders

## 2015-12-17 DIAGNOSIS — Z7689 Persons encountering health services in other specified circumstances: Secondary | ICD-10-CM

## 2015-12-31 MED FILL — DESLORATADINE 5 MG TABLET: 5 | 30 days supply | Qty: 30 | Fill #3

## 2016-01-07 MED FILL — predniSONE 10 MG (21) TBPK: 10 | 6 days supply | Qty: 21 | Fill #1

## 2016-01-11 ENCOUNTER — Ambulatory Visit: Payer: 59 | Admitting: Family Medicine

## 2016-01-11 DIAGNOSIS — H04123 Dry eye syndrome of bilateral lacrimal glands: Secondary | ICD-10-CM | POA: Diagnosis not present

## 2016-01-11 DIAGNOSIS — H5203 Hypermetropia, bilateral: Secondary | ICD-10-CM | POA: Diagnosis not present

## 2016-01-11 DIAGNOSIS — H52223 Regular astigmatism, bilateral: Secondary | ICD-10-CM | POA: Diagnosis not present

## 2016-02-23 MED FILL — BUTALB-ACETAMIN-CAFF 50-325: 50-325-40 | 23 days supply | Qty: 45 | Fill #1

## 2016-02-23 MED FILL — SPIRONOLACTONE 100 MG TAB: 100 | 90 days supply | Qty: 90 | Fill #1

## 2016-02-23 MED FILL — DESLORATADINE 5 MG TABLET: 5 | 30 days supply | Qty: 30 | Fill #0

## 2016-03-17 DIAGNOSIS — Z0279 Encounter for issue of other medical certificate: Secondary | ICD-10-CM

## 2016-03-28 ENCOUNTER — Emergency Department (HOSPITAL_COMMUNITY)
Admission: EM | Admit: 2016-03-28 | Discharge: 2016-03-28 | Disposition: A | Discharge status: Home / Self Care | Payer: 59 | Attending: Emergency Medicine | Admitting: Emergency Medicine

## 2016-03-28 ENCOUNTER — Encounter (HOSPITAL_COMMUNITY): Payer: Self-pay | Admitting: Emergency Medicine

## 2016-03-28 DIAGNOSIS — G43909 Migraine, unspecified, not intractable, without status migrainosus: Secondary | ICD-10-CM | POA: Diagnosis not present

## 2016-03-28 DIAGNOSIS — J111 Influenza due to unidentified influenza virus with other respiratory manifestations: Secondary | ICD-10-CM | POA: Diagnosis not present

## 2016-03-28 DIAGNOSIS — G43009 Migraine without aura, not intractable, without status migrainosus: Secondary | ICD-10-CM

## 2016-03-28 DIAGNOSIS — Z79899 Other long term (current) drug therapy: Secondary | ICD-10-CM | POA: Diagnosis not present

## 2016-03-28 DIAGNOSIS — Z91040 Latex allergy status: Secondary | ICD-10-CM | POA: Diagnosis not present

## 2016-03-28 LAB — COMPREHENSIVE METABOLIC PANEL
ALBUMIN: 4.1 g/dL (ref 3.5–5.0)
ALT: 11 U/L — ABNORMAL LOW (ref 14–54)
ANION GAP: 8 (ref 5–15)
AST: 14 U/L — ABNORMAL LOW (ref 15–41)
Alkaline Phosphatase: 62 U/L (ref 38–126)
BUN: 7 mg/dL (ref 6–20)
CALCIUM: 9.2 mg/dL (ref 8.9–10.3)
CO2: 24 mmol/L (ref 22–32)
Chloride: 109 mmol/L (ref 101–111)
Creatinine, Ser: 1.07 mg/dL — ABNORMAL HIGH (ref 0.44–1.00)
GFR calc non Af Amer: >60 mL/min (ref >60)
GLUCOSE: 116 mg/dL — AB (ref 65–99)
POTASSIUM: 3.6 mmol/L (ref 3.5–5.1)
SODIUM: 141 mmol/L (ref 135–145)
TOTAL PROTEIN: 7.3 g/dL (ref 6.5–8.1)
Total Bilirubin: 0.5 mg/dL (ref 0.3–1.2)

## 2016-03-28 LAB — I-STAT BETA HCG BLOOD, ED (MC, WL, AP ONLY): I-stat hCG, quantitative: <5 m[IU]/mL (ref <5)

## 2016-03-28 LAB — CBC
HCT: 45 % (ref 36.0–46.0)
Hemoglobin: 15.1 g/dL — ABNORMAL HIGH (ref 12.0–15.0)
MCH: 31.3 pg (ref 26.0–34.0)
MCHC: 33.6 g/dL (ref 30.0–36.0)
MCV: 93.4 fL (ref 78.0–100.0)
Platelets: 183 10*3/uL (ref 150–400)
RBC: 4.82 MIL/uL (ref 3.87–5.11)
RDW: 13 % (ref 11.5–15.5)
WBC: 9 10*3/uL (ref 4.0–10.5)

## 2016-03-28 LAB — I-STAT CG4 LACTIC ACID, ED: Lactic Acid, Venous: 1.24 mmol/L (ref 0.5–1.9)

## 2016-03-28 LAB — LIPASE, BLOOD: Lipase: 13 U/L (ref 11–51)

## 2016-03-28 MED ORDER — DEXAMETHASONE SODIUM PHOSPHATE 10 MG/ML IJ SOLN
10.0000 mg | Freq: Once | INTRAMUSCULAR | Status: AC
  Administered 2016-03-28: 10 mg via INTRAVENOUS
  Filled 2016-03-28: qty 1

## 2016-03-28 MED ORDER — KETOROLAC TROMETHAMINE 30 MG/ML IJ SOLN
30.0000 mg | Freq: Once | INTRAMUSCULAR | Status: AC
  Administered 2016-03-28: 30 mg via INTRAVENOUS
  Filled 2016-03-28: qty 1

## 2016-03-28 MED ORDER — DIPHENHYDRAMINE HCL 50 MG/ML IJ SOLN
25.0000 mg | Freq: Once | INTRAMUSCULAR | Status: AC
  Administered 2016-03-28: 25 mg via INTRAVENOUS
  Filled 2016-03-28: qty 1

## 2016-03-28 MED ORDER — ONDANSETRON 4 MG PO TBDP
ORAL_TABLET | ORAL | Status: AC
  Filled 2016-03-28: qty 1

## 2016-03-28 MED ORDER — METOCLOPRAMIDE HCL 5 MG/ML IJ SOLN
10.0000 mg | Freq: Once | INTRAMUSCULAR | Status: AC
  Administered 2016-03-28: 10 mg via INTRAVENOUS
  Filled 2016-03-28: qty 2

## 2016-03-28 MED ORDER — ACETAMINOPHEN 325 MG PO TABS
ORAL_TABLET | ORAL | Status: AC
  Filled 2016-03-28: qty 2

## 2016-03-28 MED ORDER — DEXTROSE 5 % IV SOLN
500.0000 mg | Freq: Once | INTRAVENOUS | Status: AC
  Administered 2016-03-28: 500 mg via INTRAVENOUS
  Filled 2016-03-28: qty 5

## 2016-03-28 MED ORDER — OSELTAMIVIR PHOSPHATE 75 MG PO CAPS: 75.0000 mg | ORAL_CAPSULE | Freq: Two times a day (BID) | ORAL | 0 refills | Status: DC

## 2016-03-28 MED ORDER — SODIUM CHLORIDE 0.9 % IV BOLUS (SEPSIS)
1000.0000 mL | Freq: Once | INTRAVENOUS | Status: AC
  Administered 2016-03-28: 1000 mL via INTRAVENOUS

## 2016-03-28 MED ORDER — ONDANSETRON HCL 4 MG/2ML IJ SOLN
4.0000 mg | Freq: Once | INTRAMUSCULAR | Status: AC
  Administered 2016-03-28: 4 mg via INTRAVENOUS
  Filled 2016-03-28: qty 2

## 2016-03-28 MED ORDER — HYDROCODONE-ACETAMINOPHEN 7.5-325 MG/15ML PO SOLN: 10.0000 mL | Freq: Four times a day (QID) | ORAL | 0 refills | Status: DC | PRN

## 2016-03-28 MED ORDER — ACETAMINOPHEN 325 MG PO TABS
650.0000 mg | ORAL_TABLET | Freq: Once | ORAL | Status: AC
  Administered 2016-03-28: 650 mg via ORAL

## 2016-03-28 MED ORDER — ONDANSETRON 4 MG PO TBDP
4.0000 mg | ORAL_TABLET | Freq: Once | ORAL | Status: AC | PRN
  Administered 2016-03-28: 4 mg via ORAL

## 2016-03-28 MED ORDER — MORPHINE SULFATE (PF) 4 MG/ML IV SOLN
4.0000 mg | Freq: Once | INTRAVENOUS | Status: AC
  Administered 2016-03-28: 4 mg via INTRAVENOUS
  Filled 2016-03-28: qty 1

## 2016-03-28 NOTE — Discharge Instructions (Signed)
Tamiflu as prescribed for influenza.  Hycet for cough or pain.  Rest, fluids, ibuprofen.

## 2016-03-28 NOTE — ED Notes (Signed)
Pt verbalizes understanding of DC teaching. NAD. VSS.

## 2016-03-28 NOTE — ED Triage Notes (Signed)
Pt here with HA, fever and body aches; pt sts hx of migraine and some N/V

## 2016-03-28 NOTE — ED Provider Notes (Addendum)
[place] DEPT Provider Note   CSN: [id number] Arrival date & time: 03/28/16  1311     History   Chief Complaint Chief Complaint  Patient presents with  . Headache  . Influenza    HPI Michele Mcbride is a 31 y.o. female. She presents evaluation of a headache and possible influenza.  She states she's had a migraine headache for the last several days. Photophobia nausea and left-sided throbbing headache. He been taking Imitrex daily with some relief. Last night started with fever cough chills body aches sore throat. States she been exposed to the flu at work. She did not receive a flu vaccine.  HPI  Past Medical History:  Diagnosis Date  . Fibromyalgia   . Migraine   . PCOS (polycystic ovarian syndrome)   . Sinus infection     Patient Active Problem List   Diagnosis Date Noted  . 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 Acute pharyngitis 09/27/2013  . Anxiety and depression 09/19/2012  . Sinusitis 03/09/2012  . PALPITATIONS 01/27/2010  . Migraine 12/19/2007  . ANXIETY STATE, UNSPECIFIED 07/06/2007  . ALLERGIC RHINITIS 07/06/2007    Past Surgical History:  Procedure Laterality Date  . WISDOM TOOTH EXTRACTION      OB History    Gravida Para Term Preterm AB Living   2 2 2          SAB TAB Ectopic Multiple Live Births                   Home Medications    Prior to Admission medications   Medication Sig Start Date End Date Taking? Authorizing Provider  butalbital-acetaminophen-caffeine (FIORICET) 50-325-40 MG tablet Take 1 tablet by mouth 2 (two) times daily as needed for headache. 12/16/15   Midge Minium, MD  desloratadine (CLARINEX) 5 MG tablet Take 5 mg by mouth daily.    Historical Provider, MD  EPINEPHrine 0.3 mg/0.3 mL IJ SOAJ injection Inject 0.3 mLs (0.3 mg total) into the muscle once. 08/08/14   Morene Crocker, CNM  HYDROcodone-acetaminophen (HYCET) 7.5-325 mg/15 ml solution Take 10 mLs  by mouth 4 (four) times daily as needed for moderate pain. 03/28/16   Tanna Furry, MD  ondansetron (ZOFRAN) 4 MG tablet Take 1 tablet (4 mg total) by mouth every 8 (eight) hours as needed for nausea or vomiting. 12/16/15   Midge Minium, MD  oseltamivir (TAMIFLU) 75 MG capsule Take 1 capsule (75 mg total) by mouth every 12 (twelve) hours. 03/28/16   Tanna Furry, MD  spironolactone (ALDACTONE) 100 MG tablet Take 100 mg by mouth daily.    Historical Provider, MD  SUMAtriptan (IMITREX) 100 MG tablet Take 1 tablet (100 mg total) by mouth every 2 (two) hours as needed for migraine. 12/16/15   Midge Minium, MD  topiramate (TOPAMAX) 100 MG tablet Take 1 tablet (100 mg total) by mouth daily. 12/16/15   Midge Minium, MD    Family History Family History  Problem Relation Age of Onset  . Hyperlipidemia Mother     Social History Social History  Substance Use Topics  . Smoking status: Never Smoker  . Smokeless tobacco: Never Used  . Alcohol use No     Allergies   Strawberry extract and Latex   Review of Systems Review of Systems  Constitutional: Positive for fever. Negative for appetite change, chills, diaphoresis and fatigue.  HENT: Positive for sore throat. Negative for mouth sores and trouble swallowing.  Eyes: Negative for visual disturbance.  Respiratory: Positive for cough. Negative for chest tightness, shortness of breath and wheezing.   Cardiovascular: Negative for chest pain.  Gastrointestinal: Positive for nausea. Negative for abdominal distention, abdominal pain, diarrhea and vomiting.  Endocrine: Negative for polydipsia, polyphagia and polyuria.  Genitourinary: Negative for dysuria, frequency and hematuria.  Musculoskeletal: Positive for myalgias. Negative for gait problem.  Skin: Negative for color change, pallor and rash.  Neurological: Positive for headaches. Negative for dizziness, syncope and light-headedness.  Hematological: Does not bruise/bleed easily.    Psychiatric/Behavioral: Negative for behavioral problems and confusion.     Physical Exam Updated Vital Signs BP (!) 85/60 (BP Location: Left Arm)   Pulse 79   Temp 98.4 F (36.9 C) (Oral)   Resp 20   SpO2 96%   Physical Exam  Constitutional: She is oriented to person, place, and time. She appears well-developed and well-nourished. No distress.  HENT:  Head: Normocephalic.  Pharynx benign  Eyes: Conjunctivae are normal. Pupils are equal, round, and reactive to light. No scleral icterus.  Neck: Normal range of motion. Neck supple. No thyromegaly present.  No cervical adenopathy. No meningismus.  Cardiovascular: Normal rate and regular rhythm.  Exam reveals no gallop and no friction rub.   No murmur heard. Pulmonary/Chest: Effort normal and breath sounds normal. No respiratory distress. She has no wheezes. She has no rales.  Clear bilateral breath sounds.  Abdominal: Soft. Bowel sounds are normal. She exhibits no distension. There is no tenderness. There is no rebound.  Musculoskeletal: Normal range of motion.  Neurological: She is alert and oriented to person, place, and time.  Normal symmetric cranial nerves  Normal symmetric Strength to shoulder shrug, triceps, biceps, grip,wrist flex/extend,and intrinsics  Norma lsymmetric sensation above and below clavicles, and to all distributions to UEs. Norma symmetric strength to flex/.extend hip and knees, dorsi/plantar flex ankles. Normal symmetric sensation to all distributions to LEs Patellar and achilles reflexes 1-2+. Downgoing Babinski   Skin: Skin is warm and dry. No rash noted.  Psychiatric: She has a normal mood and affect. Her behavior is normal.     ED Treatments / Results  Labs (all labs ordered are listed, but only abnormal results are displayed) Labs Reviewed  COMPREHENSIVE METABOLIC PANEL - Abnormal; Notable for the following:       Result Value   Glucose, Bld 116 (*)    Creatinine, Ser 1.07 (*)    AST 14 (*)     ALT 11 (*)    All other components within normal limits  CBC - Abnormal; Notable for the following:    Hemoglobin 15.1 (*)    All other components within normal limits  LIPASE, BLOOD  URINALYSIS, ROUTINE W REFLEX MICROSCOPIC  I-STAT BETA HCG BLOOD, ED ([facility], [facility], [facility] ONLY)  I-STAT CG4 LACTIC ACID, ED  I-STAT CG4 LACTIC ACID, ED    EKG  EKG Interpretation None       Radiology No results found.  Procedures Procedures (including critical care time)  Medications Ordered in ED Medications  ondansetron (ZOFRAN-ODT) 4 MG disintegrating tablet (not administered)  acetaminophen (TYLENOL) 325 MG tablet (not administered)  ondansetron (ZOFRAN-ODT) disintegrating tablet 4 mg (4 mg Oral Given 03/28/16 1419)  acetaminophen (TYLENOL) tablet 650 mg (650 mg Oral Given 03/28/16 1419)  ketorolac (TORADOL) 30 MG/ML injection 30 mg (30 mg Intravenous Given 03/28/16 1726)  metoCLOPramide (REGLAN) injection 10 mg (10 mg Intravenous Given 03/28/16 1726)  diphenhydrAMINE (BENADRYL) injection 25 mg (25 mg Intravenous Given  03/28/16 1726)  dexamethasone (DECADRON) injection 10 mg (10 mg Intravenous Given 03/28/16 1726)  sodium chloride 0.9 % bolus 1,000 mL (1,000 mLs Intravenous New Bag/Given 03/28/16 1725)  valproate (DEPACON) 500 mg in dextrose 5 % 50 mL IVPB (500 mg Intravenous New Bag/Given 03/28/16 1846)  ondansetron (ZOFRAN) injection 4 mg (4 mg Intravenous Given 03/28/16 1846)  morphine 4 MG/ML injection 4 mg (4 mg Intravenous Given 03/28/16 1845)     Initial Impression / Assessment and Plan / ED Course  I have reviewed the triage vital signs and the nursing notes.  Pertinent labs & imaging results that were available during my care of the patient were reviewed by me and considered in my medical decision making (see chart for details).     Probable influenza. Doubt meningitis. Headache is typical migraine for her. IV placed. Given 2 L fluids. Given Toradol, Reglan, Benadryl. Also Depakote,  Decadron. Morphine 4 Zofran 4. Headache down to 2. Studies normal. No cytosis. No infiltrate on chest x-ray. Appropriate for discharge home. Plan Tamiflu, I set. Rest fluids Motrin.  Final Clinical Impressions(s) / ED Diagnoses   Final diagnoses:  Migraine without aura and without status migrainosus, not intractable  Influenza    New Prescriptions New Prescriptions   HYDROCODONE-ACETAMINOPHEN (HYCET) 7.5-325 MG/15 ML SOLUTION    Take 10 mLs by mouth 4 (four) times daily as needed for moderate pain.   OSELTAMIVIR (TAMIFLU) 75 MG CAPSULE    Take 1 capsule (75 mg total) by mouth every 12 (twelve) hours.     Tanna Furry, MD 03/28/16 2004    Tanna Furry, MD 03/28/16 2004

## 2016-03-29 MED FILL — OSELTAMIVIR PHOS 75 MG CAP: 75 | 5 days supply | Qty: 10 | Fill #0

## 2016-03-29 MED FILL — HYDROCOD-APAP 7.5-325/15ML: 7.5-325 | 3 days supply | Qty: 120 | Fill #0

## 2016-04-01 DIAGNOSIS — H1045 Other chronic allergic conjunctivitis: Secondary | ICD-10-CM | POA: Diagnosis not present

## 2016-04-01 DIAGNOSIS — R05 Cough: Secondary | ICD-10-CM | POA: Diagnosis not present

## 2016-04-01 DIAGNOSIS — J3089 Other allergic rhinitis: Secondary | ICD-10-CM | POA: Diagnosis not present

## 2016-04-01 DIAGNOSIS — J301 Allergic rhinitis due to pollen: Secondary | ICD-10-CM | POA: Diagnosis not present

## 2016-04-06 ENCOUNTER — Encounter: Payer: Self-pay | Admitting: Family Medicine

## 2016-04-15 ENCOUNTER — Encounter: Payer: 59 | Admitting: Family Medicine

## 2016-04-29 ENCOUNTER — Encounter: Payer: Self-pay | Admitting: Family Medicine

## 2016-04-29 ENCOUNTER — Ambulatory Visit (INDEPENDENT_AMBULATORY_CARE_PROVIDER_SITE_OTHER): Payer: 59 | Admitting: Family Medicine

## 2016-04-29 VITALS — BP 91/68 | HR 89 | Temp 98.6°F | Resp 16 | Ht 62.0 in | Wt 118.4 lb

## 2016-04-29 DIAGNOSIS — Z0000 Encounter for general adult medical examination without abnormal findings: Secondary | ICD-10-CM | POA: Diagnosis not present

## 2016-04-29 DIAGNOSIS — F418 Other specified anxiety disorders: Secondary | ICD-10-CM

## 2016-04-29 DIAGNOSIS — F329 Major depressive disorder, single episode, unspecified: Secondary | ICD-10-CM

## 2016-04-29 DIAGNOSIS — F419 Anxiety disorder, unspecified: Principal | ICD-10-CM

## 2016-04-29 LAB — CBC WITH DIFFERENTIAL/PLATELET
BASOS ABS: 0 {cells}/uL (ref 0–200)
Basophils Relative: 0 %
EOS PCT: 1 %
Eosinophils Absolute: 65 cells/uL (ref 15–500)
HCT: 43.7 % (ref 35.0–45.0)
Hemoglobin: 14.6 g/dL (ref 11.7–15.5)
Lymphocytes Relative: 23 %
Lymphs Abs: 1495 cells/uL (ref 850–3900)
MCH: 31.1 pg (ref 27.0–33.0)
MCHC: 33.4 g/dL (ref 32.0–36.0)
MCV: 93 fL (ref 80.0–100.0)
MONOS PCT: 8 %
MPV: 11 fL (ref 7.5–12.5)
Monocytes Absolute: 520 cells/uL (ref 200–950)
NEUTROS ABS: 4420 {cells}/uL (ref 1500–7800)
NEUTROS PCT: 68 %
PLATELETS: 235 10*3/uL (ref 140–400)
RBC: 4.7 MIL/uL (ref 3.80–5.10)
RDW: 13.5 % (ref 11.0–15.0)
WBC: 6.5 10*3/uL (ref 3.8–10.8)

## 2016-04-29 LAB — TSH: TSH: 0.66 mIU/L

## 2016-04-29 MED ORDER — ESCITALOPRAM OXALATE 10 MG PO TABS: 10.0000 mg | ORAL_TABLET | Freq: Every day | ORAL | 3 refills | Status: DC

## 2016-04-29 MED FILL — ESCITALOPRAM 10 MG TABLET: 10 | 30 days supply | Qty: 30 | Fill #0
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
## 2016-04-29 NOTE — Patient Instructions (Addendum)
Follow up in 1 month to recheck mood We'll notify you of your lab results and make any changes if needed Start the Lexapro once daily for anxiety/depression Continue to work on stress management- you're doing great! Call with any questions or concerns Hang in there!!!

## 2016-04-29 NOTE — Progress Notes (Signed)
   Subjective:    Patient ID: Michele Mcbride, female    DOB: [birth year], 31 y.o.   MRN: [id number]  HPI CPE- UTD on pap (done last year).  Too young for mammo.     Review of Systems Patient reports no vision/ hearing changes, adenopathy,fever, weight change,  persistant/recurrent hoarseness , swallowing issues, chest pain, palpitations, edema, persistant/recurrent cough, hemoptysis, dyspnea (rest/exertional/paroxysmal nocturnal), gastrointestinal bleeding (melena, rectal bleeding), abdominal pain, significant heartburn, bowel changes, GU symptoms (dysuria, hematuria, incontinence), Gyn symptoms (abnormal  bleeding, pain),  syncope, focal weakness, memory loss, numbness & tingling, skin/hair/nail changes, abnormal bruising or bleeding.  + anxiety/depression- pt recently went through a break up after 3 years.  Is a single mom.  Is being short with kids.  Tearful.    Objective:   Physical Exam General Appearance:    Alert, cooperative, no distress, appears stated age  Head:    Normocephalic, without obvious abnormality, atraumatic  Eyes:    PERRL, conjunctiva/corneas clear, EOM's intact, fundi    benign, both eyes  Ears:    Normal TM's and external ear canals, both ears  Nose:   Nares normal, septum midline, mucosa normal, no drainage    or sinus tenderness  Throat:   Lips, mucosa, and tongue normal; teeth and gums normal  Neck:   Supple, symmetrical, trachea midline, no adenopathy;    Thyroid: no enlargement/tenderness/nodules  Back:     Symmetric, no curvature, ROM normal, no CVA tenderness  Lungs:     Clear to auscultation bilaterally, respirations unlabored  Chest Wall:    No tenderness or deformity   Heart:    Regular rate and rhythm, S1 and S2 normal, no murmur, rub   or gallop  Breast Exam:    Deferred to GYN  Abdomen:     Soft, non-tender, bowel sounds active all four quadrants,    no masses, no organomegaly  Genitalia:    Deferred to GYN  Rectal:    Extremities:   Extremities  normal, atraumatic, no cyanosis or edema  Pulses:   2+ and symmetric all extremities  Skin:   Skin color, texture, turgor normal, no rashes or lesions  Lymph nodes:   Cervical, supraclavicular, and axillary nodes normal  Neurologic:   CNII-XII intact, normal strength, sensation and reflexes    throughout          Assessment & Plan:

## 2016-04-29 NOTE — Assessment & Plan Note (Signed)
Pt's PE WNL.  UTD on GYN.  Check labs.  Anticipatory guidance provided.  

## 2016-04-29 NOTE — Progress Notes (Signed)
Pre visit review using our clinic review tool, if applicable. No additional management support is needed unless otherwise documented below in the visit note. 

## 2016-04-30 LAB — BASIC METABOLIC PANEL
BUN: 10 mg/dL (ref 7–25)
CHLORIDE: 107 mmol/L (ref 98–110)
CO2: 21 mmol/L (ref 20–31)
CREATININE: 1.05 mg/dL (ref 0.50–1.10)
Calcium: 9.4 mg/dL (ref 8.6–10.2)
Glucose, Bld: 94 mg/dL (ref 65–99)
Potassium: 3.9 mmol/L (ref 3.5–5.3)
Sodium: 143 mmol/L (ref 135–146)

## 2016-04-30 LAB — HEPATIC FUNCTION PANEL
ALT: 7 U/L (ref 6–29)
AST: 12 U/L (ref 10–30)
Albumin: 4.3 g/dL (ref 3.6–5.1)
Alkaline Phosphatase: 74 U/L (ref 33–115)
BILIRUBIN TOTAL: 0.4 mg/dL (ref 0.2–1.2)
Bilirubin, Direct: 0.1 mg/dL (ref <=0.2)
Indirect Bilirubin: 0.3 mg/dL (ref 0.2–1.2)
Total Protein: 6.9 g/dL (ref 6.1–8.1)

## 2016-04-30 LAB — LIPID PANEL
CHOL/HDL RATIO: 3.5 ratio (ref <5.0)
CHOLESTEROL: 189 mg/dL (ref <200)
HDL: 54 mg/dL (ref >50)
LDL Cholesterol: 110 mg/dL — ABNORMAL HIGH (ref <100)
Triglycerides: 124 mg/dL (ref <150)
VLDL: 25 mg/dL (ref <30)

## 2016-04-30 LAB — VITAMIN D 25 HYDROXY (VIT D DEFICIENCY, FRACTURES): VIT D 25 HYDROXY: 35 ng/mL (ref 30–100)

## 2016-05-03 ENCOUNTER — Telehealth: Payer: Self-pay | Admitting: Family Medicine

## 2016-05-03 NOTE — Telephone Encounter (Signed)
Patient states she is locked out of her mychart account and is unable to review her lab results.  She is requesting a call back with her results until she is able to reset her password.

## 2016-05-03 NOTE — Telephone Encounter (Signed)
Reviewed lab results with patient. Stated verbal understanding.   Also changed her password to help her get back into her mychart account.

## 2016-05-16 MED FILL — ONDANSETRON HCL 4 MG TABLET: 4 | 10 days supply | Qty: 30 | Fill #1

## 2016-05-16 MED FILL — BUTALB-ACETAMIN-CAFF 50-325: 50-325-40 | 23 days supply | Qty: 45 | Fill #2

## 2016-05-16 MED FILL — DESLORATADINE 5 MG TAB: 5 | 30 days supply | Qty: 30 | Fill #1

## 2016-05-27 ENCOUNTER — Ambulatory Visit (INDEPENDENT_AMBULATORY_CARE_PROVIDER_SITE_OTHER): Payer: 59 | Admitting: Family Medicine

## 2016-05-27 ENCOUNTER — Encounter: Payer: Self-pay | Admitting: Family Medicine

## 2016-05-27 VITALS — BP 101/72 | HR 60 | Temp 98.1°F | Resp 16 | Ht 62.0 in | Wt 117.5 lb

## 2016-05-27 DIAGNOSIS — F419 Anxiety disorder, unspecified: Principal | ICD-10-CM

## 2016-05-27 DIAGNOSIS — F329 Major depressive disorder, single episode, unspecified: Secondary | ICD-10-CM

## 2016-05-27 DIAGNOSIS — F32A Depression, unspecified: Secondary | ICD-10-CM

## 2016-05-27 DIAGNOSIS — F418 Other specified anxiety disorders: Secondary | ICD-10-CM

## 2016-05-27 MED ORDER — SCOPOLAMINE 1 MG/3DAYS TD PT72: 1.0000 | MEDICATED_PATCH | TRANSDERMAL | 1 refills | Status: DC

## 2016-05-27 MED FILL — TRANSDERM-SCOP 1.5 MG/3 DAY: 1 | 12 days supply | Qty: 4 | Fill #0

## 2016-05-27 NOTE — Progress Notes (Signed)
   Subjective:    Patient ID: Michele Mcbride, female    DOB: November 09, 1985, 31 y.o.   MRN: [id number]  HPI Anxiety/depression- started Lexapro at last visit.  Having difficulty remembering to take med daily.  Pt feels irritable, less wound up.  Co-workers have noticed an improved attitude.  Still not sleeping well- feels tired when waking (but daughter has not been sleeping well and crawls in bed w/ her).     Review of Systems For ROS see HPI     Objective:   Physical Exam  Constitutional: She is oriented to person, place, and time. She appears well-developed and well-nourished. No distress.  HENT:  Head: Normocephalic and atraumatic.  Neurological: She is alert and oriented to person, place, and time.  Skin: Skin is warm and dry.  Psychiatric: She has a normal mood and affect. Her behavior is normal. Thought content normal.  Vitals reviewed.         Assessment & Plan:

## 2016-05-27 NOTE — Progress Notes (Signed)
Pre visit review using our clinic review tool, if applicable. No additional management support is needed unless otherwise documented below in the visit note. 

## 2016-05-27 NOTE — Assessment & Plan Note (Signed)
Improved since starting Lexapro.  Pt is less irritable and people have noticed the improvement.

## 2016-05-27 NOTE — Patient Instructions (Signed)
Follow up as needed/scheduled Continue the Lexapro once daily- you're doing great! Call with any questions or concerns Happy Early Birthday!!!

## 2016-06-01 ENCOUNTER — Encounter: Payer: Self-pay | Admitting: Family Medicine

## 2016-06-01 ENCOUNTER — Telehealth: Payer: Self-pay | Admitting: Family Medicine

## 2016-06-01 NOTE — Telephone Encounter (Signed)
Pt states that she recently got a varidesk at work for her neck and back pain. Pt states that her employer needs a letter stating that this is medical necessary. Please fax to Joylene Grapes at [phone].

## 2016-06-01 NOTE — Telephone Encounter (Signed)
Letter faxed.

## 2016-06-01 NOTE — Telephone Encounter (Signed)
Letter written for pt

## 2016-06-01 NOTE — Telephone Encounter (Signed)
Please advise 

## 2016-06-02 MED FILL — ESCITALOPRAM 10 MG TABLET: 10 | 30 days supply | Qty: 30 | Fill #1

## 2016-06-13 ENCOUNTER — Other Ambulatory Visit: Payer: Self-pay | Admitting: Family Medicine

## 2016-06-13 DIAGNOSIS — G43001 Migraine without aura, not intractable, with status migrainosus: Secondary | ICD-10-CM

## 2016-06-13 MED FILL — BUTALB-ACETAMIN-CAFF 50-325: 50-325-40 | 22 days supply | Qty: 45 | Fill #0

## 2016-06-13 NOTE — Telephone Encounter (Signed)
Last OV 05/27/16 fioricet last filled 12/16/15 #5 with 2

## 2016-06-13 NOTE — Telephone Encounter (Signed)
Medication filled to pharmacy as requested.   

## 2016-06-14 ENCOUNTER — Telehealth: Payer: Self-pay | Admitting: Family Medicine

## 2016-06-14 MED ORDER — SUMATRIPTAN SUCCINATE 100 MG PO TABS: 100.0000 mg | ORAL_TABLET | ORAL | 6 refills | Status: DC | PRN

## 2016-06-14 MED FILL — SUMATRIPTAN SUCC 100 MG TAB: 100 | 30 days supply | Qty: 9 | Fill #0

## 2016-06-14 NOTE — Telephone Encounter (Signed)
Medication filled to pharmacy as requested.   

## 2016-06-14 NOTE — Telephone Encounter (Signed)
Pt asking if a refill on IMITREX, pt stating that she is going on vacation and doesn't want to be without, [facility].

## 2016-06-24 ENCOUNTER — Encounter: Payer: Self-pay | Admitting: Physician Assistant

## 2016-06-24 ENCOUNTER — Ambulatory Visit (INDEPENDENT_AMBULATORY_CARE_PROVIDER_SITE_OTHER): Payer: 59 | Admitting: Physician Assistant

## 2016-06-24 VITALS — BP 102/64 | HR 75 | Temp 98.3°F | Resp 14 | Ht 62.0 in | Wt 116.0 lb

## 2016-06-24 DIAGNOSIS — R399 Unspecified symptoms and signs involving the genitourinary system: Secondary | ICD-10-CM

## 2016-06-24 LAB — POCT URINALYSIS DIPSTICK
GLUCOSE UA: NEGATIVE
LEUKOCYTES UA: NEGATIVE
NITRITE UA: NEGATIVE
RBC UA: NEGATIVE
Spec Grav, UA: >=1.03 — AB (ref 1.010–1.025)
Urobilinogen, UA: 0.2 E.U./dL
pH, UA: 6 (ref 5.0–8.0)

## 2016-06-24 MED ORDER — CEPHALEXIN 500 MG PO CAPS: 500.0000 mg | ORAL_CAPSULE | Freq: Two times a day (BID) | ORAL | 0 refills | Status: AC

## 2016-06-24 MED FILL — CEPHALEXIN 500 MG CAPSULE: 500 | 7 days supply | Qty: 14 | Fill #0

## 2016-06-24 NOTE — Progress Notes (Signed)
Pre visit review using our clinic review tool, if applicable. No additional management support is needed unless otherwise documented below in the visit note. 

## 2016-06-24 NOTE — Patient Instructions (Signed)
Your symptoms are consistent with a bladder infection, also called acute cystitis. Please take your antibiotic (Keflex) as directed until all pills are gone.  Stay very well hydrated.  Consider a daily probiotic (Align, Culturelle, or Activia) to help prevent stomach upset caused by the antibiotic.  Taking a probiotic daily may also help prevent recurrent UTIs.  Also consider taking AZO (Phenazopyridine) tablets to help decrease pain with urination.  I will call you with your urine testing results.  We will change antibiotics if indicated.  Call or return to clinic if symptoms are not resolved by completion of antibiotic.   Urinary Tract Infection A urinary tract infection (UTI) can occur any place along the urinary tract. The tract includes the kidneys, ureters, bladder, and urethra. A type of germ called bacteria often causes a UTI. UTIs are often helped with antibiotic medicine.  HOME CARE   If given, take antibiotics as told by your doctor. Finish them even if you start to feel better.  Drink enough fluids to keep your pee (urine) clear or pale yellow.  Avoid tea, drinks with caffeine, and bubbly (carbonated) drinks.  Pee often. Avoid holding your pee in for a long time.  Pee before and after having sex (intercourse).  Wipe from front to back after you poop (bowel movement) if you are a woman. Use each tissue only once. GET HELP RIGHT AWAY IF:   You have back pain.  You have lower belly (abdominal) pain.  You have chills.  You feel sick to your stomach (nauseous).  You throw up (vomit).  Your burning or discomfort with peeing does not go away.  You have a fever.  Your symptoms are not better in 3 days. MAKE SURE YOU:   Understand these instructions.  Will watch your condition.  Will get help right away if you are not doing well or get worse. Document Released: 08/10/2007 Document Revised: 11/16/2011 Document Reviewed: 09/22/2011 Exit[employer] Patient Information 2015  [employer]. This information is not intended to replace advice given to you by your health care provider. Make sure you discuss any questions you have with your health care provider.   

## 2016-06-24 NOTE — Progress Notes (Signed)
Patient presents to clinic today c/o intermittent lower back pain and suprapubic pressure x 2 weeks. Denies hematuria, fever, chills. Notes urinary urgency, frequency, dysuria. Denies vaginal pressure, pain, discharge or lesion. Has had prior UTI and states this feels just like prior episodes. Is staying well-hydrated. Drinks a 20-oz soda per day.   Past Medical History:  Diagnosis Date  . Fibromyalgia   . Migraine   . PCOS (polycystic ovarian syndrome)   . Sinus infection     Current Outpatient Prescriptions on File Prior to Visit  Medication Sig Dispense Refill  . butalbital-acetaminophen-caffeine (FIORICET, ESGIC) 50-325-40 MG tablet TAKE 1 TABLET BY MOUTH 2 TIMES DAILY AS NEEDED FOR HEADACHE 45 tablet 2  . desloratadine (CLARINEX) 5 MG tablet Take 5 mg by mouth daily.    Marland Kitchen EPINEPHrine 0.3 mg/0.3 mL IJ SOAJ injection Inject 0.3 mLs (0.3 mg total) into the muscle once. 1 Device 2  . escitalopram (LEXAPRO) 10 MG tablet Take 1 tablet (10 mg total) by mouth daily. 30 tablet 3  . ondansetron (ZOFRAN) 4 MG tablet Take 1 tablet (4 mg total) by mouth every 8 (eight) hours as needed for nausea or vomiting. 30 tablet 1  . scopolamine (TRANSDERM-SCOP, 1.5 MG,) 1 MG/3DAYS Place 1 patch (1.5 mg total) onto the skin every 3 (three) days. 4 patch 1  . spironolactone (ALDACTONE) 100 MG tablet Take 100 mg by mouth daily.    . SUMAtriptan (IMITREX) 100 MG tablet Take 1 tablet (100 mg total) by mouth every 2 (two) hours as needed for migraine. 10 tablet 6  . topiramate (TOPAMAX) 100 MG tablet Take 1 tablet (100 mg total) by mouth daily. 90 tablet 1   No current facility-administered medications on file prior to visit.     Allergies  Allergen Reactions  . Strawberry Extract Anaphylaxis  . Latex Rash    Rash and sores    Family History  Problem Relation Age of Onset  . Hyperlipidemia Mother     Social History   Social History  . Marital status: Legally Separated    Spouse name: N/A  .  Number of children: N/A  . Years of education: N/A   Social History Main Topics  . Smoking status: Never Smoker  . Smokeless tobacco: Never Used  . Alcohol use No  . Drug use: No  . Sexual activity: Yes   Other Topics Concern  . None   Social History Narrative  . None    Review of Systems - See HPI.  All other ROS are negative.  BP 102/64   Pulse 75   Temp 98.3 F (36.8 C) (Oral)   Resp 14   Ht '5\' 2"'  (1.575 m)   Wt 116 lb (52.6 kg)   SpO2 99%   BMI 21.22 kg/m   Physical Exam  Constitutional: She is oriented to person, place, and time and well-developed, well-nourished, and in no distress.  HENT:  Head: Normocephalic and atraumatic.  Eyes: Conjunctivae are normal.  Neck: Neck supple.  Cardiovascular: Normal rate, regular rhythm, normal heart sounds and intact distal pulses.   Pulmonary/Chest: Effort normal and breath sounds normal. No respiratory distress. She has no wheezes. She has no rales. She exhibits no tenderness.  Abdominal: Soft. Bowel sounds are normal. She exhibits no distension. There is no tenderness.  Neurological: She is alert and oriented to person, place, and time.  Skin: Skin is warm and dry. No rash noted.  Psychiatric: Affect normal.  Vitals reviewed.  Recent Results (  from the past 2160 hour(s))  Lipase, blood     Status: None   Collection Time: 03/28/16  3:20 PM  Result Value Ref Range   Lipase 13 11 - 51 U/L  Comprehensive metabolic panel     Status: Abnormal   Collection Time: 03/28/16  3:20 PM  Result Value Ref Range   Sodium 141 135 - 145 mmol/L   Potassium 3.6 3.5 - 5.1 mmol/L   Chloride 109 101 - 111 mmol/L   CO2 24 22 - 32 mmol/L   Glucose, Bld 116 (H) 65 - 99 mg/dL   BUN 7 6 - 20 mg/dL   Creatinine, Ser 1.07 (H) 0.44 - 1.00 mg/dL   Calcium 9.2 8.9 - 10.3 mg/dL   Total Protein 7.3 6.5 - 8.1 g/dL   Albumin 4.1 3.5 - 5.0 g/dL   AST 14 (L) 15 - 41 U/L   ALT 11 (L) 14 - 54 U/L   Alkaline Phosphatase 62 38 - 126 U/L   Total  Bilirubin 0.5 0.3 - 1.2 mg/dL   GFR calc non Af Amer >60 >60 mL/min   GFR calc Af Amer >60 >60 mL/min    Comment: (NOTE) The eGFR has been calculated using the CKD EPI equation. This calculation has not been validated in all clinical situations. eGFR's persistently <60 mL/min signify possible Chronic Kidney Disease.    Anion gap 8 5 - 15  CBC     Status: Abnormal   Collection Time: 03/28/16  3:20 PM  Result Value Ref Range   WBC 9.0 4.0 - 10.5 K/uL   RBC 4.82 3.87 - 5.11 MIL/uL   Hemoglobin 15.1 (H) 12.0 - 15.0 g/dL   HCT 45.0 36.0 - 46.0 %   MCV 93.4 78.0 - 100.0 fL   MCH 31.3 26.0 - 34.0 pg   MCHC 33.6 30.0 - 36.0 g/dL   RDW 13.0 11.5 - 15.5 %   Platelets 183 150 - 400 K/uL  I-Stat beta hCG blood, ED     Status: None   Collection Time: 03/28/16  3:34 PM  Result Value Ref Range   I-stat hCG, quantitative <5.0 <5 mIU/mL   Comment 3            Comment:   GEST. AGE      CONC.  (mIU/mL)   <=1 WEEK        5 - 50     2 WEEKS       50 - 500     3 WEEKS       100 - 10,000     4 WEEKS     1,000 - 30,000        FEMALE AND NON-PREGNANT FEMALE:     LESS THAN 5 mIU/mL   I-Stat CG4 Lactic Acid, ED     Status: None   Collection Time: 03/28/16  3:35 PM  Result Value Ref Range   Lactic Acid, Venous 1.24 0.5 - 1.9 mmol/L  Lipid panel     Status: Abnormal   Collection Time: 04/29/16  3:47 PM  Result Value Ref Range   Cholesterol 189 <200 mg/dL   Triglycerides 124 <150 mg/dL   HDL 54 >50 mg/dL   Total CHOL/HDL Ratio 3.5 <5.0 Ratio   VLDL 25 <30 mg/dL   LDL Cholesterol 110 (H) <100 mg/dL  Basic metabolic panel     Status: None   Collection Time: 04/29/16  3:47 PM  Result Value Ref Range   Sodium 143 135 -  146 mmol/L   Potassium 3.9 3.5 - 5.3 mmol/L   Chloride 107 98 - 110 mmol/L   CO2 21 20 - 31 mmol/L   Glucose, Bld 94 65 - 99 mg/dL   BUN 10 7 - 25 mg/dL   Creat 1.05 0.50 - 1.10 mg/dL   Calcium 9.4 8.6 - 10.2 mg/dL  TSH     Status: None   Collection Time: 04/29/16  3:47 PM    Result Value Ref Range   TSH 0.66 mIU/L    Comment:   Reference Range   > or = 20 Years  0.40-4.50   Pregnancy Range First trimester  0.26-2.66 Second trimester 0.55-2.73 Third trimester  0.43-2.91     Hepatic function panel     Status: None   Collection Time: 04/29/16  3:47 PM  Result Value Ref Range   Total Bilirubin 0.4 0.2 - 1.2 mg/dL   Bilirubin, Direct 0.1 <=0.2 mg/dL   Indirect Bilirubin 0.3 0.2 - 1.2 mg/dL   Alkaline Phosphatase 74 33 - 115 U/L   AST 12 10 - 30 U/L   ALT 7 6 - 29 U/L   Total Protein 6.9 6.1 - 8.1 g/dL   Albumin 4.3 3.6 - 5.1 g/dL  VITAMIN D 25 Hydroxy (Vit-D Deficiency, Fractures)     Status: None   Collection Time: 04/29/16  3:47 PM  Result Value Ref Range   Vit D, 25-Hydroxy 35 30 - 100 ng/mL    Comment: Vitamin D Status           25-OH Vitamin D        Deficiency                <20 ng/mL        Insufficiency         20 - 29 ng/mL        Optimal             > or = 30 ng/mL   For 25-OH Vitamin D testing on patients on D2-supplementation and patients for whom quantitation of D2 and D3 fractions is required, the QuestAssureD 25-OH VIT D, (D2,D3), LC/MS/MS is recommended: order code [phone] (patients > 2 yrs).   CBC with Differential/Platelet     Status: None   Collection Time: 04/29/16  3:47 PM  Result Value Ref Range   WBC 6.5 3.8 - 10.8 K/uL   RBC 4.70 3.80 - 5.10 MIL/uL   Hemoglobin 14.6 11.7 - 15.5 g/dL   HCT 43.7 35.0 - 45.0 %   MCV 93.0 80.0 - 100.0 fL   MCH 31.1 27.0 - 33.0 pg   MCHC 33.4 32.0 - 36.0 g/dL   RDW 13.5 11.0 - 15.0 %   Platelets 235 140 - 400 K/uL   MPV 11.0 7.5 - 12.5 fL   Neutro Abs 4,420 1,500 - 7,800 cells/uL   Lymphs Abs 1,495 850 - 3,900 cells/uL   Monocytes Absolute 520 200 - 950 cells/uL   Eosinophils Absolute 65 15 - 500 cells/uL   Basophils Absolute 0 0 - 200 cells/uL   Neutrophils Relative % 68 %   Lymphocytes Relative 23 %   Monocytes Relative 8 %   Eosinophils Relative 1 %   Basophils Relative 0 %    Smear Review Criteria for review not met   POCT Urinalysis Dipstick     Status: Abnormal   Collection Time: 06/24/16  8:09 AM  Result Value Ref Range   Color, UA dark yellow  Clarity, UA cloudy    Glucose, UA negative    Bilirubin, UA 1+ (17umol/L)    Ketones, UA +- (0.37mol/L)    Spec Grav, UA >=1.030 (A) 1.010 - 1.025   Blood, UA negative    pH, UA 6.0 5.0 - 8.0   Protein, UA +- (0.15g/L)    Urobilinogen, UA 0.2 0.2 or 1.0 E.U./dL   Nitrite, UA negative    Leukocytes, UA Negative Negative    Assessment/Plan: 1. UTI symptoms Classic UTI symptoms in patient with history of UTI. Urine dip unremarkable. Will send for culture as she notes she typically has normal dip and abnormal culture. She is also leaving for a cruise tomorrow. Culture sent. Rx keflex. Will alter based on results. Supportive measures reviewed.  - POCT Urinalysis Dipstick - CULTURE, URINE COMPREHENSIVE   MLeeanne Rio PA-C

## 2016-06-26 LAB — CULTURE, URINE COMPREHENSIVE

## 2016-07-08 MED FILL — DESLORATADINE 5 MG TAB: 5 | 30 days supply | Qty: 30 | Fill #2

## 2016-07-18 MED FILL — ESCITALOPRAM 10 MG TABLET: 10 | 30 days supply | Qty: 30 | Fill #2

## 2016-07-18 MED FILL — TOPIRAMATE 100 MG TABLET: 100 | 90 days supply | Qty: 90 | Fill #1

## 2016-07-25 DIAGNOSIS — Z8742 Personal history of other diseases of the female genital tract: Secondary | ICD-10-CM | POA: Diagnosis not present

## 2016-07-25 DIAGNOSIS — L680 Hirsutism: Secondary | ICD-10-CM | POA: Diagnosis not present

## 2016-07-25 DIAGNOSIS — Z01411 Encounter for gynecological examination (general) (routine) with abnormal findings: Secondary | ICD-10-CM | POA: Diagnosis not present

## 2016-07-25 DIAGNOSIS — R87612 Low grade squamous intraepithelial lesion on cytologic smear of cervix (LGSIL): Secondary | ICD-10-CM | POA: Diagnosis not present

## 2016-08-02 MED FILL — BUTALB-ACETAMIN-CAFF 50-325: 50-325-40 | 22 days supply | Qty: 45 | Fill #1

## 2016-08-02 MED FILL — SUMATRIPTAN SUCC 100 MG TAB: 100 | 30 days supply | Qty: 9 | Fill #1

## 2016-08-03 MED FILL — NUVARING VAGINAL RING: 0.12-0.015 | 84 days supply | Qty: 3 | Fill #0

## 2016-08-03 MED FILL — SPIRONOLACTONE 50 MG TABLET: 50 | 60 days supply | Qty: 120 | Fill #0

## 2016-08-12 MED FILL — DESLORATADINE 5 MG TAB: 5 | 30 days supply | Qty: 30 | Fill #3

## 2016-08-22 MED FILL — ESCITALOPRAM 10 MG TABLET: 10 | 30 days supply | Qty: 30 | Fill #3

## 2016-09-09 ENCOUNTER — Other Ambulatory Visit: Payer: Self-pay | Admitting: Family Medicine

## 2016-09-09 ENCOUNTER — Telehealth: Payer: Self-pay | Admitting: Family Medicine

## 2016-09-09 MED FILL — ONDANSETRON HCL 4 MG TABLET: 4 | 10 days supply | Qty: 30 | Fill #0

## 2016-09-09 MED FILL — BUTALB-ACETAMIN-CAFF 50-325: 50-325-40 | 22 days supply | Qty: 45 | Fill #2

## 2016-09-09 MED FILL — SUMATRIPTAN SUCC 100 MG TAB: 100 | 30 days supply | Qty: 9 | Fill #2

## 2016-09-09 NOTE — Telephone Encounter (Signed)
Fax received on 09/09/16 from [employer] including FMLA forms that need to be completed by pcp for migraine headaches.  Charge sheet generated, forms placed on pcp's basket in clinical/nurse pod.

## 2016-09-13 NOTE — Telephone Encounter (Signed)
Form faxed to [employer], copy sent to scan, copy for charge entry.

## 2016-09-13 NOTE — Telephone Encounter (Signed)
Form completed and placed in basket  

## 2016-09-15 ENCOUNTER — Other Ambulatory Visit: Payer: Self-pay | Admitting: Family Medicine

## 2016-09-15 MED FILL — ESCITALOPRAM 10 MG TABLET: 10 | 30 days supply | Qty: 30 | Fill #0

## 2016-09-15 MED FILL — DESLORATADINE 5 MG TAB: 5 | 30 days supply | Qty: 30 | Fill #4

## 2016-09-23 DIAGNOSIS — J301 Allergic rhinitis due to pollen: Secondary | ICD-10-CM | POA: Diagnosis not present

## 2016-09-23 DIAGNOSIS — J3089 Other allergic rhinitis: Secondary | ICD-10-CM | POA: Diagnosis not present

## 2016-09-23 DIAGNOSIS — H1045 Other chronic allergic conjunctivitis: Secondary | ICD-10-CM | POA: Diagnosis not present

## 2016-09-23 DIAGNOSIS — R05 Cough: Secondary | ICD-10-CM | POA: Diagnosis not present

## 2016-09-23 MED FILL — QVAR REDIHALER 80 MCG/ACT A: 80 | 30 days supply | Qty: 11 | Fill #0

## 2016-10-13 ENCOUNTER — Other Ambulatory Visit: Payer: Self-pay | Admitting: Family Medicine

## 2016-10-13 DIAGNOSIS — G43001 Migraine without aura, not intractable, with status migrainosus: Secondary | ICD-10-CM

## 2016-10-13 MED FILL — BUTALB-ACETAMIN-CAFF 50-325: 50-325-40 | 22 days supply | Qty: 45 | Fill #0

## 2016-10-13 NOTE — Telephone Encounter (Signed)
Last OV 05/27/16 fioricet last filled 06/13/16 #45 with 2   No CSC or UDS

## 2016-10-13 NOTE — Telephone Encounter (Signed)
Medication filled to pharmacy as requested.   

## 2016-10-15 MED FILL — ESCITALOPRAM 10 MG TABLET: 10 | 30 days supply | Qty: 30 | Fill #1

## 2016-10-17 ENCOUNTER — Other Ambulatory Visit: Payer: Self-pay | Admitting: Family Medicine

## 2016-10-17 DIAGNOSIS — G44221 Chronic tension-type headache, intractable: Secondary | ICD-10-CM

## 2016-10-17 MED FILL — SPIRONOLACTONE 50 MG TABLET: 50 | 60 days supply | Qty: 120 | Fill #1

## 2016-10-17 MED FILL — DESLORATADINE 5 MG TAB: 5 | 30 days supply | Qty: 30 | Fill #5

## 2016-10-17 MED FILL — TOPIRAMATE 100 MG TABLET: 100 | 90 days supply | Qty: 90 | Fill #0

## 2016-10-19 ENCOUNTER — Telehealth: Payer: Self-pay | Admitting: *Deleted

## 2016-10-19 NOTE — Telephone Encounter (Signed)
Patient called stating that she is still having trouble sleeping and she is wanting to know if there is anything that we can give her.  Advised that we would need to get her in for an appointment since she had not discussed this with Dr. Birdie Riddle since office visit in March and she said that she understood that, but that she could not take any time off work to come for a visit so she wanted me to send Dr. Birdie Riddle a message to see if there is anything she can do without being seen.    Message routed to provider to advise.

## 2016-10-19 NOTE — Telephone Encounter (Signed)
Pt can try OTC Melatonin first and see if that improves her sleep.  Any other medication would require an appt

## 2016-10-20 ENCOUNTER — Encounter: Payer: Self-pay | Admitting: Family Medicine

## 2016-10-20 ENCOUNTER — Ambulatory Visit (INDEPENDENT_AMBULATORY_CARE_PROVIDER_SITE_OTHER): Payer: 59 | Admitting: Family Medicine

## 2016-10-20 VITALS — BP 121/81 | HR 98 | Temp 98.3°F | Resp 16 | Ht 62.0 in | Wt 127.2 lb

## 2016-10-20 DIAGNOSIS — F329 Major depressive disorder, single episode, unspecified: Secondary | ICD-10-CM | POA: Diagnosis not present

## 2016-10-20 DIAGNOSIS — F419 Anxiety disorder, unspecified: Secondary | ICD-10-CM

## 2016-10-20 DIAGNOSIS — G4700 Insomnia, unspecified: Secondary | ICD-10-CM | POA: Diagnosis not present

## 2016-10-20 DIAGNOSIS — F32A Depression, unspecified: Secondary | ICD-10-CM

## 2016-10-20 MED ORDER — SUVOREXANT 10 MG PO TABS: 1.0000 | ORAL_TABLET | Freq: Every day | ORAL | 3 refills | Status: DC

## 2016-10-20 MED ORDER — ESCITALOPRAM OXALATE 20 MG PO TABS: 20.0000 mg | ORAL_TABLET | Freq: Every day | ORAL | 6 refills | Status: DC

## 2016-10-20 MED FILL — BELSOMRA 10 MG TABLET: 10 | 10 days supply | Qty: 10 | Fill #0

## 2016-10-20 MED FILL — ESCITALOPRAM 20 MG TABLET: 20 | 30 days supply | Qty: 30 | Fill #0

## 2016-10-20 NOTE — Progress Notes (Signed)
Pre visit review using our clinic review tool, if applicable. No additional management support is needed unless otherwise documented below in the visit note. 

## 2016-10-20 NOTE — Assessment & Plan Note (Signed)
New to provider, ongoing for pt.  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  She is not able to take a sedating meds due to need to hear her children.  Will start Belsomra and monitor for improvement.  Pt expressed understanding and is in agreement w/ plan.

## 2016-10-20 NOTE — Telephone Encounter (Signed)
Patient states that she has tried melatonin and it does not work for her.   She is off work today and asked if she could be seen.  Patient was scheduled in a 3:15 Same Day appointment slot.

## 2016-10-20 NOTE — Progress Notes (Signed)
   Subjective:    Patient ID: Michele Mcbride, female    DOB: [birth year], 31 y.o.   MRN: [id number]  HPI Insomnia- this has been ongoing for pt and not improving.  No relief w/ OTC sleep aides or melatonin.  Trouble both falling and staying asleep.  Very concerned about sedating meds due to young children- needs to be able to function.  Pt is now having difficulty functioning during the day due to fatigue and exhaustion.   Review of Systems For ROS see HPI     Objective:   Physical Exam  Constitutional: She is oriented to person, place, and time. She appears well-developed and well-nourished.  HENT:  Head: Normocephalic and atraumatic.  Neurological: She is alert and oriented to person, place, and time.  Skin: Skin is warm and dry.  Psychiatric: Her behavior is normal. Thought content normal.  Tearful, obviously upset  Vitals reviewed.         Assessment & Plan:

## 2016-10-20 NOTE — Assessment & Plan Note (Signed)
Deteriorated.  Pt's biological father came back into her life this week when she had to bury her grandmother.  She has a very difficult relationship w/ her father and this has dragged up a lot of repressed issue.  Will increase Lexapro to 20mg  and follow closely.

## 2016-10-20 NOTE — Patient Instructions (Signed)
Follow up in 3-4 weeks to recheck mood and sleep Increase the Lexapro to 20mg  daily- 2 of what you have at home and 1 of the new prescription Start the [place] nightly for sleep Call with any questions or concerns Hang in there!  You can do this!!!

## 2016-10-21 ENCOUNTER — Telehealth: Payer: Self-pay | Admitting: *Deleted

## 2016-10-21 NOTE — Telephone Encounter (Signed)
PA started through [employer] for Belsomra 10mg .   KEY: [id number]

## 2016-10-26 NOTE — Telephone Encounter (Signed)
Routed to provider to advise

## 2016-10-26 NOTE — Telephone Encounter (Signed)
Please alert pt to fact that her insurance company is refusing the [place].  At this time, her best option would be to try Ambien 5mg  nightly and take 1/2 tab (2.5mg ) and if this doesn't work for her or is too sedating, we can then get the [place] approved

## 2016-10-26 NOTE — Telephone Encounter (Signed)
Left message for patient to call back to discuss.

## 2016-10-26 NOTE — Telephone Encounter (Signed)
The PA for the Belsomra 10 mg tablets has been denied. Patient has to try at least one of the following : eszopiclone, zaleplon, or zolpidem.   The denial was faxed to our office instead.  Thanks!

## 2016-10-27 NOTE — Telephone Encounter (Signed)
Patient states that she has a history of Ambien and she cannot take that medication.   She asked that I attempt another PA letting them know why she cannot take the medication.   This has been started.  If this PA gets denied patient states that she will file an appeal.

## 2016-11-02 ENCOUNTER — Encounter: Payer: Self-pay | Admitting: *Deleted

## 2016-11-02 MED FILL — BELSOMRA 10 MG TABLET: 10 | 30 days supply | Qty: 30 | Fill #1

## 2016-11-03 DIAGNOSIS — J301 Allergic rhinitis due to pollen: Secondary | ICD-10-CM | POA: Diagnosis not present

## 2016-11-03 DIAGNOSIS — T783XXA Angioneurotic edema, initial encounter: Secondary | ICD-10-CM | POA: Diagnosis not present

## 2016-11-03 DIAGNOSIS — H1045 Other chronic allergic conjunctivitis: Secondary | ICD-10-CM | POA: Diagnosis not present

## 2016-11-03 DIAGNOSIS — R05 Cough: Secondary | ICD-10-CM | POA: Diagnosis not present

## 2016-11-11 ENCOUNTER — Ambulatory Visit: Payer: 59 | Admitting: Family Medicine

## 2016-11-11 DIAGNOSIS — Z0289 Encounter for other administrative examinations: Secondary | ICD-10-CM

## 2016-11-23 ENCOUNTER — Telehealth: Payer: Self-pay | Admitting: Family Medicine

## 2016-11-23 NOTE — Telephone Encounter (Signed)
Pt calling regarding no show she received from 9/7, pt father did call to states that due to car trouble pt would not be able to make appt. I sent a message/Skype to KT regarding this, however KT did not respond due to seeing other pt in the office. Is this ok to waive fee?  Skype message below, [11/11/2016 2:02 PM] Lala Lund:  Your 2:15 pt father just called at 1:59, pt transmission went out and not able to pick up car and make appt on time, please advise ok to cancel.

## 2016-11-23 NOTE — Telephone Encounter (Signed)
Ok to cancel

## 2016-11-23 NOTE — Telephone Encounter (Signed)
Please remove No Show fee for pt.

## 2016-11-25 ENCOUNTER — Ambulatory Visit (INDEPENDENT_AMBULATORY_CARE_PROVIDER_SITE_OTHER): Payer: 59 | Admitting: Family Medicine

## 2016-11-25 ENCOUNTER — Encounter: Payer: Self-pay | Admitting: Family Medicine

## 2016-11-25 VITALS — BP 122/81 | HR 75 | Temp 98.1°F | Resp 16 | Ht 62.0 in | Wt 127.0 lb

## 2016-11-25 DIAGNOSIS — B9689 Other specified bacterial agents as the cause of diseases classified elsewhere: Secondary | ICD-10-CM | POA: Diagnosis not present

## 2016-11-25 DIAGNOSIS — J019 Acute sinusitis, unspecified: Secondary | ICD-10-CM | POA: Diagnosis not present

## 2016-11-25 MED ORDER — AMOXICILLIN 875 MG PO TABS: 875.0000 mg | ORAL_TABLET | Freq: Two times a day (BID) | ORAL | 0 refills | Status: DC

## 2016-11-25 MED ORDER — PROMETHAZINE-DM 6.25-15 MG/5ML PO SYRP: 5.0000 mL | ORAL_SOLUTION | Freq: Four times a day (QID) | ORAL | 0 refills | Status: DC | PRN

## 2016-11-25 MED FILL — AMOXICILLIN 875 MG TABLET: 875 | 10 days supply | Qty: 20 | Fill #0

## 2016-11-25 MED FILL — PROMETHAZINE-DM SYRUP: 6.25-15 | 12 days supply | Qty: 240 | Fill #0

## 2016-11-25 NOTE — Patient Instructions (Signed)
Follow up as needed Start the Amoxicillin twice daily- take w/ food Use the Promethazine cough syrup as needed- may cause drowsiness Mucinex DM for daytime cough Drink plenty of fluids REST! Hang in there!!!

## 2016-11-25 NOTE — Progress Notes (Signed)
Pre visit review using our clinic review tool, if applicable. No additional management support is needed unless otherwise documented below in the visit note. 

## 2016-11-25 NOTE — Progress Notes (Signed)
   Subjective:    Patient ID: Michele Mcbride, female    DOB: 1985/05/28, 31 y.o.   MRN: [id number]  HPI URI- sxs started ~1 week ago.  + HAs, facial pain/pressure.  Nasal congestion, cough.  Cough is productive of yellow sputum.  Nasal drainage is thick, yellow.  Taking Mucinex.  Bilateral ear fullness.  No tooth pain.  + N/V- post tussive emesis.  + sick contacts.   Review of Systems For ROS see HPI     Objective:   Physical Exam  Constitutional: She appears well-developed and well-nourished. No distress.  HENT:  Head: Normocephalic and atraumatic.  Right Ear: Tympanic membrane normal.  Left Ear: Tympanic membrane normal.  Nose: Mucosal edema and rhinorrhea present. Right sinus exhibits maxillary sinus tenderness. Right sinus exhibits no frontal sinus tenderness. Left sinus exhibits maxillary sinus tenderness. Left sinus exhibits no frontal sinus tenderness.  Mouth/Throat: Uvula is midline and mucous membranes are normal. Posterior oropharyngeal erythema present. No oropharyngeal exudate.  Eyes: Pupils are equal, round, and reactive to light. Conjunctivae and EOM are normal.  Neck: Normal range of motion. Neck supple.  Cardiovascular: Normal rate, regular rhythm and normal heart sounds.   Pulmonary/Chest: Effort normal and breath sounds normal. No respiratory distress. She has no wheezes.  Lymphadenopathy:    She has no cervical adenopathy.  Vitals reviewed.         Assessment & Plan:  Maxillary sinusitis- new.  Pt's sxs and PE consistent w/ infxn.  Start abx.  Cough meds prn.  Reviewed supportive care and red flags that should prompt return.  Pt expressed understanding and is in agreement w/ plan.

## 2016-12-05 ENCOUNTER — Encounter: Payer: Self-pay | Admitting: Family Medicine

## 2016-12-05 MED FILL — DESLORATADINE 5 MG TABLET: 5 | 30 days supply | Qty: 30 | Fill #0

## 2016-12-05 MED FILL — BUTALB-ACETAMIN-CAFF 50-325: 50-325-40 | 22 days supply | Qty: 45 | Fill #1

## 2016-12-05 MED FILL — ESCITALOPRAM 20 MG TABLET: 20 | 30 days supply | Qty: 30 | Fill #1

## 2016-12-05 MED FILL — BELSOMRA 10 MG TABLET: 10 | 30 days supply | Qty: 30 | Fill #2

## 2016-12-05 MED FILL — SUMATRIPTAN SUCC 100 MG TAB: 100 | 30 days supply | Qty: 9 | Fill #3

## 2016-12-06 MED ORDER — FLUCONAZOLE 150 MG PO TABS: 150.0000 mg | ORAL_TABLET | Freq: Once | ORAL | 0 refills | Status: AC

## 2016-12-06 MED FILL — FLUCONAZOLE 150 MG TABLET: 150 | 1 days supply | Qty: 1 | Fill #0

## 2017-01-04 MED FILL — SPIRONOLACTONE 50 MG TAB: 50 | 60 days supply | Qty: 120 | Fill #2

## 2017-01-04 MED FILL — NUVARING VAGINAL RING: 0.12-0.015 | 84 days supply | Qty: 3 | Fill #1

## 2017-01-12 MED FILL — BUTALB-ACETAMIN-CAFF 50-325: 50-325-40 | 22 days supply | Qty: 45 | Fill #2

## 2017-01-12 MED FILL — SUMATRIPTAN SUCC 100 MG TAB: 100 | 30 days supply | Qty: 9 | Fill #4

## 2017-01-12 MED FILL — ESCITALOPRAM 20 MG TABLET: 20 | 30 days supply | Qty: 30 | Fill #2

## 2017-01-12 MED FILL — TOPIRAMATE 100 MG TABLET: 100 | 90 days supply | Qty: 90 | Fill #1

## 2017-01-12 MED FILL — DESLORATADINE 5 MG TABLET: 5 | 30 days supply | Qty: 30 | Fill #1

## 2017-03-06 ENCOUNTER — Other Ambulatory Visit: Payer: Self-pay | Admitting: Obstetrics

## 2017-03-08 ENCOUNTER — Other Ambulatory Visit: Payer: Self-pay | Admitting: Family Medicine

## 2017-03-08 DIAGNOSIS — G43001 Migraine without aura, not intractable, with status migrainosus: Secondary | ICD-10-CM

## 2017-03-08 MED ORDER — BUTALBITAL-APAP-CAFFEINE 50-325-40 MG PO TABS: ORAL_TABLET | ORAL | 2 refills | Status: DC

## 2017-03-08 MED FILL — TERCONAZOLE 0.4% VAG CREAM: 0.4 | 7 days supply | Qty: 45 | Fill #0

## 2017-03-08 MED FILL — FLUCONAZOLE 150 MG TABLET: 150 | 2 days supply | Qty: 2 | Fill #0

## 2017-03-08 MED FILL — BUTALB-ACETAMIN-CAFF 50-325: 50-325-40 | 22 days supply | Qty: 45 | Fill #0

## 2017-03-08 NOTE — Telephone Encounter (Signed)
Last OV 11/25/16 fioricet last filled 10/13/2016 #45 with 2

## 2017-03-08 NOTE — Telephone Encounter (Signed)
Medication filled to pharmacy as requested.   

## 2017-03-08 NOTE — Telephone Encounter (Signed)
Copied from [facility] [id number]. Topic: Quick Communication - Rx Refill/Question >> Mar 08, 2017 11:37 AM Lolita Rieger, RMA wrote: Has the patient contacted their pharmacy? yes   (Agent: If no, request that the patient contact the pharmacy for the refill.)fioricet 50-325-40 mg   Preferred Pharmacy (with phone number or street name):[facility]   Agent: Please be advised that RX refills may take up to 3 business days. We ask that you follow-up with your pharmacy.

## 2017-03-09 MED FILL — ESCITALOPRAM 20 MG TABLET: 20 | 30 days supply | Qty: 30 | Fill #3

## 2017-03-09 MED FILL — SUMATRIPTAN SUCC 100 MG TAB: 100 | 30 days supply | Qty: 8 | Fill #5

## 2017-03-09 MED FILL — SPIRONOLACTONE 50 MG TABLET: 50 | 60 days supply | Qty: 120 | Fill #3

## 2017-03-09 MED FILL — DESLORATADINE 5 MG TABLET: 5 | 30 days supply | Qty: 30 | Fill #2

## 2017-03-09 MED FILL — BELSOMRA 10 MG TABLET: 10 | 30 days supply | Qty: 30 | Fill #3

## 2017-04-04 MED FILL — TRANSDERM-SCOP 1.5 MG/72HR: 1 | 12 days supply | Qty: 4 | Fill #1

## 2017-04-04 MED FILL — NUVARING VAGINAL RING: 0.12-0.015 | 84 days supply | Qty: 3 | Fill #2

## 2017-04-13 ENCOUNTER — Telehealth: Payer: Self-pay | Admitting: General Practice

## 2017-04-13 NOTE — Telephone Encounter (Signed)
Pt called in wanting an appointment for a migraine. Pt was advised that we did not have any openings for an appointment today. Pt refused an appointment with another office, UC, or ED.   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 Patient was advised that unfortunately at 3:24 in the afternoon there is nothing that could be done. Again offered appointment with another [facility] office and patient declined stating "she is not going to see people she does not know".   Advised patient that we had a doctor out sick and another is off this afternoon, and there was nothing that could be done today.  Offered appointment for tomorrow with our office and pt hung up on me.

## 2017-04-13 NOTE — Telephone Encounter (Signed)
I am sorry that pt is upset however we did everything we could to accommodate her, it just wasn't the answer she wanted.  Hanging up on staff is not appropriate.

## 2017-04-18 MED FILL — BUTALB-ACETAMIN-CAFF 50-325: 50-325-40 | 22 days supply | Qty: 45 | Fill #1

## 2017-04-18 MED FILL — SUMATRIPTAN SUCC 100 MG TAB: 100 | 30 days supply | Qty: 8 | Fill #6

## 2017-04-24 ENCOUNTER — Encounter: Payer: Self-pay | Admitting: General Practice

## 2017-04-25 NOTE — Telephone Encounter (Signed)
Called pt to discuss her dissatisfaction and treatment of staff from 04/13/17. I have been out of the office since then and reached out on my return. I was unable to leave a VM on her cell.

## 2017-05-11 ENCOUNTER — Ambulatory Visit (INDEPENDENT_AMBULATORY_CARE_PROVIDER_SITE_OTHER): Payer: Self-pay | Admitting: Nurse Practitioner

## 2017-05-11 ENCOUNTER — Encounter: Payer: Self-pay | Admitting: Nurse Practitioner

## 2017-05-11 VITALS — BP 98/64 | HR 76 | Temp 97.9°F | Resp 16 | Wt 145.6 lb

## 2017-05-11 DIAGNOSIS — J019 Acute sinusitis, unspecified: Principal | ICD-10-CM

## 2017-05-11 DIAGNOSIS — B9789 Other viral agents as the cause of diseases classified elsewhere: Secondary | ICD-10-CM

## 2017-05-11 MED ORDER — AMOXICILLIN-POT CLAVULANATE 875-125 MG PO TABS: 1.0000 | ORAL_TABLET | Freq: Two times a day (BID) | ORAL | 0 refills | Status: AC

## 2017-05-11 MED FILL — AMOX TR-K CLV 875-125 MG TA: 875-125 | 10 days supply | Qty: 20 | Fill #0

## 2017-05-11 NOTE — Progress Notes (Signed)
Subjective:  Michele Mcbride is a 32 y.o. female who presents for evaluation of possible sinusitis.  Symptoms include cough described as nonproductive, facial pain, headache described as dull, nasal congestion, post nasal drip, sinus pressure, sinus pain, sneezing and sore throat.  Onset of symptoms was 3 days ago, and has been gradually worsening since that time.  Treatment to date:  none.  High risk factors for influenza complications:  co-morbid illness and patient has a history of asthma.  The following portions of the patient's history were reviewed and updated as appropriate:  allergies, current medications and past medical history.  Constitutional: positive for fatigue, negative for anorexia, chills, fevers and malaise Eyes: negative Ears, nose, mouth, throat, and face: positive for nasal congestion and sore throat, negative for ear drainage and earaches Respiratory: positive for asthma and cough, negative for dyspnea on exertion, pneumonia, sputum, stridor and wheezing Cardiovascular: negative Gastrointestinal: negative Neurological: positive for headaches, negative for coordination problems, dizziness, paresthesia, vertigo and weakness Allergic/Immunologic: positive for hay fever Objective:  BP 98/64 (BP Location: Right Arm, Patient Position: Sitting, Cuff Size: Normal)   Pulse 76   Temp 97.9 F (36.6 C) (Oral)   Resp 16   Wt 145 lb 9.6 oz (66 kg)   SpO2 98%   BMI 26.63 kg/m  General appearance: alert, cooperative and no distress Head: Normocephalic, without obvious abnormality, atraumatic Eyes: conjunctivae/corneas clear. PERRL, EOM's intact. Fundi benign. Ears: normal TM's and external ear canals both ears Nose: yellow discharge, turbinates swollen, inflamed, moderate maxillary sinus tenderness bilateral, moderate frontal sinus tenderness bilateral Throat: lips, mucosa, and tongue normal; teeth and gums normal Lungs: clear to auscultation bilaterally Heart: regular rate and  rhythm, S1, S2 normal, no murmur, click, rub or gallop Abdomen: soft, non-tender; bowel sounds normal; no masses,  no organomegaly Pulses: 2+ and symmetric Skin: Skin color, texture, turgor normal. No rashes or lesions Lymph nodes: cervical and submanidular nodes normal Neurologic: Grossly normal    Assessment:  Acute Viral Sinusitis    Plan:  Discussed diagnosis and treatment of sinusitis. Discussed the importance of avoiding unnecessary antibiotic therapy. Educational material distributed and questions answered. Suggested symptomatic OTC remedies. Suggested brief course of Afrin or similar. Supportive care with appropriate antipyretics and fluids. Nasal saline spray for congestion. Azithromycin prescription printed for patient.  Informed patient to wait and see if symptoms rapidly worsen before starting abx.  Patient states Flonase does not work for her, so instructed her to use a saline nasal spray.   per orders. Follow up as needed.

## 2017-05-11 NOTE — Patient Instructions (Addendum)

## 2017-05-22 ENCOUNTER — Other Ambulatory Visit: Payer: Self-pay | Admitting: Family Medicine

## 2017-05-22 DIAGNOSIS — G44221 Chronic tension-type headache, intractable: Secondary | ICD-10-CM

## 2017-05-22 MED FILL — TOPIRAMATE 100 MG TABLET: 100 | 90 days supply | Qty: 90 | Fill #0

## 2017-05-22 MED FILL — ESCITALOPRAM 20 MG TABLET: 20 | 30 days supply | Qty: 30 | Fill #4

## 2017-05-22 MED FILL — DESLORATADINE 5 MG TABLET: 5 | 30 days supply | Qty: 30 | Fill #3

## 2017-05-22 MED FILL — BELSOMRA 10 MG TABLET: 10 | 30 days supply | Qty: 30 | Fill #0

## 2017-05-23 MED FILL — SPIRONOLACTONE 50 MG TABLET: 50 | 60 days supply | Qty: 120 | Fill #0

## 2017-06-07 MED FILL — SUMATRIPTAN SUCC 100 MG TAB: 100 | 30 days supply | Qty: 8 | Fill #7

## 2017-06-07 MED FILL — ONDANSETRON HCL 4 MG TABLET: 4 | 10 days supply | Qty: 30 | Fill #1

## 2017-06-07 MED FILL — BUTALB-ACETAMIN-CAFF 50-325: 50-325-40 | 22 days supply | Qty: 45 | Fill #2

## 2017-06-20 MED FILL — NUVARING VAGINAL RING: 0.12-0.015 | 84 days supply | Qty: 3 | Fill #3

## 2017-06-20 MED FILL — PROAIR RESPICLICK INHAL PWD: 108 (90 BAS | 30 days supply | Qty: 1 | Fill #0

## 2017-06-20 MED FILL — QVAR REDIHALER 80 MCG/ACT A: 80 | 30 days supply | Qty: 11 | Fill #1

## 2017-06-26 DIAGNOSIS — J3089 Other allergic rhinitis: Secondary | ICD-10-CM | POA: Diagnosis not present

## 2017-06-26 DIAGNOSIS — H1045 Other chronic allergic conjunctivitis: Secondary | ICD-10-CM | POA: Diagnosis not present

## 2017-06-26 DIAGNOSIS — J301 Allergic rhinitis due to pollen: Secondary | ICD-10-CM | POA: Diagnosis not present

## 2017-06-26 DIAGNOSIS — R05 Cough: Secondary | ICD-10-CM | POA: Diagnosis not present

## 2017-06-26 MED FILL — predniSONE 10 MG TABS: 10 | 6 days supply | Qty: 21 | Fill #0

## 2017-06-26 MED FILL — DESLORATADINE 5 MG TABLET: 5 | 90 days supply | Qty: 90 | Fill #0

## 2017-07-27 ENCOUNTER — Encounter: Payer: Self-pay | Admitting: Nurse Practitioner

## 2017-07-27 NOTE — Progress Notes (Signed)
Subjective:    Michele Mcbride is a 32 y.o. female who presents for evaluation of headache.  When I walked in the room the patient states I have done everything I can do.  I asked her what she had done she stated that she has taken Imitrex, Topamax, Zofran, and ibuprofen.  I informed the patient that we do not administer injections in this clinic.  Patient states she is done everything that she can do for this headache over the last 3 days.  She further states there is nothing else she can do and felt like she needed to be seen for something additional.  Patient was offered Imitrex refill, but patient informed that she had taken the maximum dose of that medication.  Offered other options for patient to include [facility] ER, or [facility].  Patient states she does not want to go sit in the ER, nor does she have $60 for co-pay.  Patient states she is going to go home and try to sleep it off.  Informed patient that she is more got more than welcome to be seen here, but that the options presented are what is currently available.  Patient states she will go home and try to sleep off the pain.  She will follow-up if needed.

## 2017-07-28 ENCOUNTER — Other Ambulatory Visit: Payer: Self-pay | Admitting: Family Medicine

## 2017-07-28 DIAGNOSIS — G43001 Migraine without aura, not intractable, with status migrainosus: Secondary | ICD-10-CM

## 2017-07-28 MED FILL — ONDANSETRON HCL 4 MG TABLET: 4 | 10 days supply | Qty: 30 | Fill #0

## 2017-07-28 MED FILL — SUMATRIPTAN SUCC 100 MG TAB: 100 | 30 days supply | Qty: 9 | Fill #0

## 2017-07-28 MED FILL — BUTALB-ACETAMIN-CAFF 50-325: 50-325-40 | 22 days supply | Qty: 45 | Fill #0

## 2017-07-28 MED FILL — ESCITALOPRAM 20 MG TABLET: 20 | 30 days supply | Qty: 30 | Fill #5

## 2017-07-28 NOTE — Telephone Encounter (Signed)
Last OV 11/16/16 fioricet last filled 03/08/17 #45 with 2 zofran last filled 09/09/16 #30 with 1 imitrex last filled 06/14/16 #10 with 6

## 2017-08-11 ENCOUNTER — Ambulatory Visit: Payer: 59 | Admitting: Neurology

## 2017-08-11 ENCOUNTER — Encounter: Payer: Self-pay | Admitting: Neurology

## 2017-08-11 VITALS — BP 107/80 | HR 84 | Ht 62.0 in | Wt 152.0 lb

## 2017-08-11 DIAGNOSIS — R519 Headache, unspecified: Secondary | ICD-10-CM

## 2017-08-11 DIAGNOSIS — H539 Unspecified visual disturbance: Secondary | ICD-10-CM | POA: Diagnosis not present

## 2017-08-11 DIAGNOSIS — R51 Headache with orthostatic component, not elsewhere classified: Secondary | ICD-10-CM

## 2017-08-11 DIAGNOSIS — G43711 Chronic migraine without aura, intractable, with status migrainosus: Secondary | ICD-10-CM

## 2017-08-11 DIAGNOSIS — G8929 Other chronic pain: Secondary | ICD-10-CM

## 2017-08-11 MED ORDER — ERENUMAB-AOOE 140 MG/ML ~~LOC~~ SOAJ: 140.0000 mg | SUBCUTANEOUS | 11 refills | Status: DC

## 2017-08-11 MED ORDER — TOPIRAMATE ER 200 MG PO CAP24: 200.0000 mg | ORAL_CAPSULE | Freq: Every day | ORAL | 11 refills | Status: DC

## 2017-08-11 MED ORDER — ZOLMITRIPTAN 5 MG NA SOLN: 1.0000 | NASAL | 6 refills | Status: DC | PRN

## 2017-08-11 MED ORDER — KETOROLAC TROMETHAMINE 60 MG/2ML IM SOLN
60.0000 mg | Freq: Once | INTRAMUSCULAR | Status: AC
  Administered 2017-08-11: 60 mg via INTRAMUSCULAR

## 2017-08-11 MED ORDER — CYCLOBENZAPRINE HCL 10 MG PO TABS: 10.0000 mg | ORAL_TABLET | Freq: Three times a day (TID) | ORAL | 3 refills | Status: DC | PRN

## 2017-08-11 MED FILL — ZOMIG 5 MG NASAL SPRAY: 5 | 30 days supply | Qty: 12 | Fill #0

## 2017-08-11 MED FILL — CYCLOBENZAPRINE 10 MG TAB: 10 | 30 days supply | Qty: 90 | Fill #0

## 2017-08-11 MED FILL — AIMOVIG 140 MG/ML SOAJ: 140 | 30 days supply | Qty: 1 | Fill #0 | Status: TO

## 2017-08-11 MED FILL — TROKENDI XR 200 MG CAPSULE: 200 | 30 days supply | Qty: 30 | Fill #0

## 2017-08-11 NOTE — Patient Instructions (Addendum)
Erenumab: Patient drug information Copyright [id number] [employer] All rights reserved. (For additional information see "Erenumab: Drug information") Brand Names: [place]  Aimovig  What is this drug used for?   It is used to prevent migraine headaches.  What do I need to tell my doctor BEFORE I take this drug?   If you have an allergy to this drug or any part of this drug.   If you are allergic to any drugs like this one, any other drugs, foods, or other substances. Tell your doctor about the allergy and what signs you had, like rash; hives; itching; shortness of breath; wheezing; cough; swelling of face, lips, tongue, or throat; or any other signs.   This drug may interact with other drugs or health problems.   Tell your doctor and pharmacist about all of your drugs (prescription or OTC, natural products, vitamins) and health problems. You must check to make sure that it is safe for you to take this drug with all of your drugs and health problems. Do not start, stop, or change the dose of any drug without checking with your doctor.  What are some things I need to know or do while I take this drug?   Tell all of your health care providers that you take this drug. This includes your doctors, nurses, pharmacists, and dentists.   If you have a latex allergy, talk with your doctor.   Tell your doctor if you are pregnant or plan on getting pregnant. You will need to talk about the benefits and risks of using this drug while you are pregnant.   Tell your doctor if you are breast-feeding. You will need to talk about any risks to your baby.  What are some side effects that I need to call my doctor about right away?   WARNING/CAUTION: Even though it may be rare, some people may have very bad and sometimes deadly side effects when taking a drug. Tell your doctor or get medical help right away if you have any of the following signs or symptoms that may be related to a very bad side effect:   Signs of an  allergic reaction, like rash; hives; itching; red, swollen, blistered, or peeling skin with or without fever; wheezing; tightness in the chest or throat; trouble breathing, swallowing, or talking; unusual hoarseness; or swelling of the mouth, face, lips, tongue, or throat.  What are some other side effects of this drug?   All drugs may cause side effects. However, many people have no side effects or only have minor side effects. Call your doctor or get medical help if any of these side effects or any other side effects bother you or do not go away:   Redness or swelling where the shot is given.   Pain where the shot was given.   Constipation.   These are not all of the side effects that may occur. If you have questions about side effects, call your doctor. Call your doctor for medical advice about side effects.   You may report side effects to your national health agency.  How is this drug best taken?   Use this drug as ordered by your doctor. Read all information given to you. Follow all instructions closely.   It is given as a shot into the fatty part of the skin on the top of the thigh, belly area, or upper arm.   If you will be giving yourself the shot, your doctor or nurse will teach you  how to give the shot.   Follow how to use as you have been told by the doctor or read the package insert.   If stored in a refrigerator, let this drug come to room temperature before using it. Leave it at room temperature for at least 30 minutes. Do not heat this drug.   Protect from heat and sunlight.   Do not shake.   Do not give into skin that is irritated, bruised, red, infected, or scarred.   Do not use if the solution is cloudy, leaking, or has particles.   Do not use if solution changes color.   Throw away after using. Do not use the device more than 1 time.   Throw away needles in a needle/sharp disposal box. Do not reuse needles or other items. When the box is full, follow all local rules for  getting rid of it. Talk with a doctor or pharmacist if you have any questions.  What do I do if I miss a dose?   Take a missed dose as soon as you think about it.   After taking a missed dose, start a new schedule based on when the dose is taken.  How do I store and/or throw out this drug?   Store in a refrigerator. Do not freeze.   Store in the carton to protect from light.   Do not use if it has been frozen.   If you drop this drug on a hard surface, do not use it.   If needed, you may store at room temperature for up to 7 days. Write down the date you take this drug out of the refrigerator. If stored at room temperature and not used within 7 days, throw this drug away.   Do not put this drug back in the refrigerator after it has been stored at room temperature.   Keep all drugs in a safe place. Keep all drugs out of the reach of children and pets.   Throw away unused or expired drugs. Do not flush down a toilet or pour down a drain unless you are told to do so. Check with your pharmacist if you have questions about the best way to throw out drugs. There may be drug take-back programs in your area.  General drug facts   If your symptoms or health problems do not get better or if they become worse, call your doctor.   Do not share your drugs with others and do not take anyone else's drugs.   Keep a list of all your drugs (prescription, natural products, vitamins, OTC) with you. Give this list to your doctor.   Talk with the doctor before starting any new drug, including prescription or OTC, natural products, or vitamins.   Some drugs may have another patient information leaflet. If you have any questions about this drug, please talk with your doctor, nurse, pharmacist, or other health care provider.   If you think there has been an overdose, call your poison control center or get medical care right away. Be ready to tell or show what was taken, how much, and when it happened.    Topiramate  extended-release capsules What is this medicine? TOPIRAMATE (toe PYRE a mate) is used to treat seizures in adults or children with epilepsy. It is also used for the prevention of migraine headaches. This medicine may be used for other purposes; ask your health care provider or pharmacist if you have questions. COMMON BRAND NAME(S): Trokendi XR What  should I tell my health care provider before I take this medicine? They need to know if you have any of these conditions: -cirrhosis of the liver or liver disease -diarrhea -glaucoma -kidney stones or kidney disease -lung disease like asthma, obstructive pulmonary disease, emphysema -metabolic acidosis -on a ketogenic diet -scheduled for surgery or a procedure -suicidal thoughts, plans, or attempt; a previous suicide attempt by you or a family member -an unusual or allergic reaction to topiramate, other medicines, foods, dyes, or preservatives -pregnant or trying to get pregnant -breast-feeding How should I use this medicine? Take this medicine by mouth with a glass of water. Follow the directions on the prescription label. Trokendi XR capsules must be swallowed whole. Do not sprinkle on food, break, crush, dissolve, or chew. Qudexy XR capsules may be swallowed whole or opened and sprinkled on a small amount of soft food. This mixture must be swallowed immediately. Do not chew or store mixture for later use. You may take this medicine with meals. Take your medicine at regular intervals. Do not take it more often than directed. Talk to your pediatrician regarding the use of this medicine in children. Special care may be needed. While Trokendi XR may be prescribed for children as young as 6 years and Qudexy XR may be prescribed for children as young as 2 years for selected conditions, precautions do apply. Overdosage: If you think you have taken too much of this medicine contact a poison control center or emergency room at once. NOTE: This medicine is  only for you. Do not share this medicine with others. What if I miss a dose? If you miss a dose, take it as soon as you can. If it is almost time for your next dose, take only that dose. Do not take double or extra doses. What may interact with this medicine? Do not take this medicine with any of the following medications: -probenecid This medicine may also interact with the following medications: -acetazolamide -alcohol -amitriptyline -birth control pills -digoxin -hydrochlorothiazide -lithium -medicines for pain, sleep, or muscle relaxation -metformin -methazolamide -other seizure or epilepsy medicines -pioglitazone -risperidone This list may not describe all possible interactions. Give your health care provider a list of all the medicines, herbs, non-prescription drugs, or dietary supplements you use. Also tell them if you smoke, drink alcohol, or use illegal drugs. Some items may interact with your medicine. What should I watch for while using this medicine? Visit your doctor or health care professional for regular checks on your progress. Do not stop taking this medicine suddenly. This increases the risk of seizures if you are using this medicine to control epilepsy. Wear a medical identification bracelet or chain to say you have epilepsy or seizures, and carry a card that lists all your medicines. This medicine can decrease sweating and increase your body temperature. Watch for signs of deceased sweating or fever, especially in children. Avoid extreme heat, hot baths, and saunas. Be careful about exercising, especially in hot weather. Contact your health care provider right away if you notice a fever or decrease in sweating. You should drink plenty of fluids while taking this medicine. If you have had kidney stones in the past, this will help to reduce your chances of forming kidney stones. If you have stomach pain, with nausea or vomiting and yellowing of your eyes or skin, call your  doctor immediately. You may get drowsy, dizzy, or have blurred vision. Do not drive, use machinery, or do anything that needs mental alertness until  you know how this medicine affects you. To reduce dizziness, do not sit or stand up quickly, especially if you are an older patient. Alcohol can increase drowsiness and dizziness. Avoid alcoholic drinks. Do not drink alcohol for 6 hours before or 6 hours after taking Trokendi XR. If you notice blurred vision, eye pain, or other eye problems, seek medical attention at once for an eye exam. The use of this medicine may increase the chance of suicidal thoughts or actions. Pay special attention to how you are responding while on this medicine. Any worsening of mood, or thoughts of suicide or dying should be reported to your health care professional right away. This medicine may increase the chance of developing metabolic acidosis. If left untreated, this can cause kidney stones, bone disease, or slowed growth in children. Symptoms include breathing fast, fatigue, loss of appetite, irregular heartbeat, or loss of consciousness. Call your doctor immediately if you experience any of these side effects. Also, tell your doctor about any surgery you plan on having while taking this medicine since this may increase your risk for metabolic acidosis. Birth control pills may not work properly while you are taking this medicine. Talk to your doctor about using an extra method of birth control. Women who become pregnant while using this medicine may enroll in the [place] Pregnancy Registry by calling [phone]. This registry collects information about the safety of antiepileptic drug use during pregnancy. What side effects may I notice from receiving this medicine? Side effects that you should report to your doctor or health care professional as soon as possible: -allergic reactions like skin rash, itching or hives, swelling of the face, lips, or  tongue -decreased sweating and/or rise in body temperature -depression -difficulty breathing, fast or irregular breathing patterns -difficulty speaking -difficulty walking or controlling muscle movements -hearing impairment -redness, blistering, peeling or loosening of the skin, including inside the mouth -tingling, pain or numbness in the hands or feet -unusually weak or tired -worsening of mood, thoughts or actions of suicide or dying Side effects that usually do not require medical attention (report to your doctor or health care professional if they continue or are bothersome): -altered taste -back pain, joint or muscle aches and pains -diarrhea, or constipation -headache -loss of appetite -nausea -stomach upset, indigestion -tremors This list may not describe all possible side effects. Call your doctor for medical advice about side effects. You may report side effects to FDA at 1-800-FDA-1088. Where should I keep my medicine? Keep out of the reach of children. Store at room temperature between 15 and 30 degrees C (59 and 86 degrees F) in a tightly closed container. Protect from moisture. Throw away any unused medicine after the expiration date. NOTE: This sheet is a summary. It may not cover all possible information. If you have questions about this medicine, talk to your doctor, pharmacist, or health care provider.  2018 Elsevier/Gold Standard (2015-06-12 12:33:11)  Zolmitriptan Nasal Spray  What is this medicine? ZOLMITRIPTAN (zohl mi TRIP tan) is used to treat migraines with or without aura. An aura is a strange feeling or visual disturbance that warns you of an attack. It is not used to prevent migraines. This medicine may be used for other purposes; ask your health care provider or pharmacist if you have questions. COMMON BRAND NAME(S): Zomig What should I tell my health care provider before I take this medicine? They need to know if you have any of these  conditions: -bowel disease, colitis or  bloody diarrhea -diabetes -family history of heart disease -fast or irregular heartbeat -headaches that are different from your usual migraine -heart or blood vessel disease, angina (chest pain), or previous heart attack -high blood pressure -high cholesterol -history of stroke, transient ischemic attacks (TIAs or mini-strokes), or intracranial bleeding -kidney or liver disease -overweight -postmenopausal or surgical removal of uterus and ovaries -seizure disorder -tobacco smoker -an unusual or allergic reaction to zolmitriptan, other medicines, foods, dyes, or preservatives -pregnant or trying to get pregnant -breast-feeding How should I use this medicine? This medicine is for use in the nose. Follow the directions on the prescription label. This medicine is taken at the first symptoms of a migraine. It is not for everyday use. Do not take your medicine more often than directed. Talk to your pediatrician regarding the use of this medicine in children. While this drug may be prescribed for children as young as 12 years for selected conditions, precautions do apply. Overdosage: If you think you have taken too much of this medicine contact a poison control center or emergency room at once. NOTE: This medicine is only for you. Do not share this medicine with others. What if I miss a dose? This does not apply; this medicine is not for regular use. What may interact with this medicine? Do not take this medicine with any of the following medicines: -amphetamine or cocaine -dihydroergotamine, ergotamine, ergoloid mesylates, methysergide, or ergot-type medication - do not take within 24 hours of taking zolmitriptan -feverfew -MAOIs like Carbex, Eldepryl, Marplan, Nardil, and Parnate - do not take zolmitriptan within 2 weeks of stopping MAOI therapy -other migraine medicines like almotriptan, eletriptan, naratriptan, rizatriptan, sumatriptan - do not take  within 24 hours of taking zolmitriptan -tryptophan This medicine may also interact with the following medications: -cimetidine -birth control pills -medicines for mental depression, anxiety or mood problems -propranolol This list may not describe all possible interactions. Give your health care provider a list of all the medicines, herbs, non-prescription drugs, or dietary supplements you use. Also tell them if you smoke, drink alcohol, or use illegal drugs. Some items may interact with your medicine. What should I watch for while using this medicine? Only take this medicine for a migraine headache. Take it if you get warning symptoms or at the start of a migraine attack. It is not for regular use to prevent migraine attacks. You may get drowsy or dizzy. Do not drive, use machinery, or do anything that needs mental alertness until you know how this medicine affects you. To reduce dizzy or fainting spells, do not sit or stand up quickly, especially if you are an older patient. Alcohol can increase drowsiness, dizziness and flushing. Avoid alcoholic drinks. Smoking cigarettes may increase the risk of heart-related side effects from using this medicine. If you take migraine medicines for 10 or more days a month, your migraines may get worse. Keep a diary of headache days and medicine use. Contact your healthcare professional if your migraine attacks occur more frequently. What side effects may I notice from receiving this medicine? Side effects that you should report to your doctor or health care professional as soon as possible: -allergic reactions like skin rash, itching or hives, swelling of the face, lips, or tongue -fast, slow, or irregular heart beat -increased blood pressure -palpitations -severe stomach pain and cramping, bloody diarrhea -signs and symptoms of a blood clot such as breathing problems; changes in vision; chest pain; severe, sudden headache; pain, swelling, warmth in the leg;  trouble  speaking; sudden numbness or weakness of the face, arm or leg -tingling, pain, or numbness in the face, hands, or feet Side effects that usually do not require medical attention (report to your doctor or health care professional if they continue or are bothersome): -dry mouth -feeling warm, flushing, or redness of the face -headache -muscle cramps, pain -nausea, vomiting -unusually tired or weak This list may not describe all possible side effects. Call your doctor for medical advice about side effects. You may report side effects to FDA at 1-800-FDA-1088. Where should I keep my medicine? Keep out of the reach of children. Store at room temperature between 20 and 25 degrees C (68 and 77 degrees F). Throw away any unused medicine after the expiration date. NOTE: This sheet is a summary. It may not cover all possible information. If you have questions about this medicine, talk to your doctor, pharmacist, or health care provider.  2018 Elsevier/Gold Standard (2013-09-05 15:36:46)

## 2017-08-11 NOTE — Progress Notes (Signed)
Pt given Toradol 60 mg injection in R ventrogluteal muscle. Tolerated well, slight bleeding resolved with gauze and tape application. See MAR.

## 2017-08-11 NOTE — Progress Notes (Signed)
[facility]    Provider:  Dr Jaynee Eagles Referring Provider: Dr. Patrecia Pour Primary Care Physician:  Dr. Patrecia Pour  CC:  Migraines  HPI:  Michele Mcbride is a 32 y.o. female here as a referral from Dr. Birdie Riddle for migraines. Suffering since the age of 32. Started medications at the age of 32. She has seen previous neurologists. She wakes with migraines and worse bending over and with valsalva. Can take her out of work for 3 days. She has light sensitivity, sound sensitivity, nausea, vomiting, worse with movement, its unilateral either side may spread to the whole head, pounding throbbing, can be severe. 22 headache days a month, 5 severe migraine days, 5-7 moderately severe migraines, 10 mild migraines or dull headaches. Can worsen with bending over and valsalva. No aura, + vision changes, no weakness or numbness. No medication overuse. This frequency and severity has been ongoing for a year and intractable. Mother, sister,2 aunts, cousin, grandmother have migraines. She has associated neck pain/ No other focal neurologic deficits, associated symptoms, inciting events or modifiable factors. She has examined food triggers, red wine trigers, garlic, sleeping well and getting enough sleep, exercises. No other focal neurologic deficits, associated symptoms, inciting events or modifiable factors.  Medications tried: Topamax currently, propranolol, fioricetl, exapro, zofran, imitrex, axert, relpax, has been on Topamax 200mg , reglan, robaxin, tizanidine.  Reviewed notes, labs and imaging from outside physicians, which showed:  TSH normal  Dg cervical spine 2004: reviewed report:  IMPRESSION NEGATIVE FOR FRACTURE. LUMBAR SPINE 4 VIEWS THE PATIENT HAS FIVE NON-RIB-BEARING LUMBAR VERTEBRAE.  THE ALIGNMENT IS ANATOMIC.  POSTERIOR ELEMENTS ARE INTACT.  SACROILIAC JOINTS ARE SYMMETRIC.  THE SACRUM IS NORMAL. IMPRESSION NEGATIVE FOR FRACTURE.  Review of Systems: Patient complains of  symptoms per HPI as well as the following symptoms: headache. Pertinent negatives and positives per HPI. All others negative.   Social History   Socioeconomic History  . Marital status: Divorced    Spouse name: Not on file  . Number of children: 2  . Years of education: LPN  . Highest education level: Not on file  Occupational History  . Not on file  Social Needs  . Financial resource strain: Not on file  . Food insecurity:    Worry: Not on file    Inability: Not on file  . Transportation needs:    Medical: Not on file    Non-medical: Not on file  Tobacco Use  . Smoking status: Never Smoker  . Smokeless tobacco: Never Used  Substance and Sexual Activity  . Alcohol use: No  . Drug use: No  . Sexual activity: Yes    Comment: nuvaring  Lifestyle  . Physical activity:    Days per week: Not on file    Minutes per session: Not on file  . Stress: Not on file  Relationships  . Social connections:    Talks on phone: Not on file    Gets together: Not on file    Attends religious service: Not on file    Active member of club or organization: Not on file    Attends meetings of clubs or organizations: Not on file    Relationship status: Not on file  . Intimate partner violence:    Fear of current or ex partner: Not on file    Emotionally abused: Not on file    Physically abused: Not on file    Forced sexual activity: Not on file  Other Topics Concern  . Not on file  Social History Narrative   Lives at home with her 2 children   Right handed   Caffeine: 2 cups daily at max    Family History  Problem Relation Age of Onset  . Hyperlipidemia Mother   . Neuropathy Mother   . Migraines Mother   . Migraines Other        sisters, aunts, and grandmother had migraines on mother's side    Past Medical History:  Diagnosis Date  . Fibromyalgia   . Migraine   . PCOS (polycystic ovarian syndrome)   . Sinus infection     Past Surgical History:  Procedure Laterality Date    . WISDOM TOOTH EXTRACTION      Current Outpatient Medications  Medication Sig Dispense Refill  . albuterol (PROVENTIL HFA;VENTOLIN HFA) 108 (90 Base) MCG/ACT inhaler Inhale into the lungs every 6 (six) hours as needed for wheezing or shortness of breath.    . BELSOMRA 10 MG TABS TAKE 1 TABLET BY MOUTH AT BEDTIME (Patient taking differently: TAKE 1 TABLET BY MOUTH AT BEDTIME PRN) 30 tablet 3  . butalbital-acetaminophen-caffeine (FIORICET, ESGIC) 50-325-40 MG tablet TAKE 1 TABLET BY MOUTH TWICE A DAY AS NEEDED FOR HEADACHE 45 tablet 2  . desloratadine (CLARINEX) 5 MG tablet Take 5 mg by mouth daily.    Marland Kitchen escitalopram (LEXAPRO) 20 MG tablet Take 1 tablet (20 mg total) by mouth daily. 30 tablet 6  . Etonogestrel-Ethinyl Estradiol (NUVARING VA) Place vaginally.    . ondansetron (ZOFRAN) 4 MG tablet TAKE 1 TABLET (4 MG TOTAL) BY MOUTH EVERY 8 (EIGHT) HOURS AS NEEDED FOR NAUSEA OR VOMITING. 30 tablet 1  . QVAR REDIHALER 80 MCG/ACT inhaler   5  . spironolactone (ALDACTONE) 50 MG tablet Take 50 mg by mouth 2 (two) times daily.   3  . SUMAtriptan (IMITREX) 100 MG tablet TAKE 1 TABLET (100 MG TOTAL) BY MOUTH EVERY 2 (TWO) HOURS AS NEEDED FOR MIGRAINE. (MAX DOSE 2 TABS/24 HRS.) 10 tablet 6  . cyclobenzaprine (FLEXERIL) 10 MG tablet Take 1 tablet (10 mg total) by mouth 3 (three) times daily as needed for muscle spasms. 90 tablet 3  . EPINEPHrine (AUVI-Q) 0.3 mg/0.3 mL IJ SOAJ injection Inject 0.3 mg into the muscle once.    Eduard Roux (AIMOVIG) 140 MG/ML SOAJ Inject 140 mg into the skin every 30 (thirty) days. 1 pen 11  . Topiramate ER (TROKENDI XR) 200 MG CP24 Take 200 mg by mouth at bedtime. 30 capsule 11  . zolmitriptan (ZOMIG) 5 MG nasal solution Place 1 spray into the nose as needed for migraine. 12 Units 6   No current facility-administered medications for this visit.     Allergies as of 08/11/2017 - Review Complete 08/11/2017  Allergen Reaction Noted  . Strawberry extract Anaphylaxis  03/06/2014  . Latex Rash 04/04/2014    Vitals: BP 107/80 (BP Location: Right Arm, Patient Position: Sitting)   Pulse 84   Ht 5\' 2"  (1.575 m)   Wt 152 lb (68.9 kg)   BMI 27.80 kg/m  Last Weight:  Wt Readings from Last 1 Encounters:  08/11/17 152 lb (68.9 kg)   Last Height:   Ht Readings from Last 1 Encounters:  08/11/17 5\' 2"  (1.575 m)     Physical exam: Exam: Gen: NAD, conversant, well nourised, well groomed                     CV: RRR, no MRG. No Carotid Bruits. No peripheral edema, warm, nontender Eyes:  Conjunctivae clear without exudates or hemorrhage  Neuro: Detailed Neurologic Exam  Speech:    Speech is normal; fluent and spontaneous with normal comprehension.  Cognition:    The patient is oriented to person, place, and time;     recent and remote memory intact;     language fluent;     normal attention, concentration,     fund of knowledge Cranial Nerves:    The pupils are equal, round, and reactive to light. The fundi are normal and spontaneous venous pulsations are present. Visual fields are full to finger confrontation. Extraocular movements are intact. Trigeminal sensation is intact and the muscles of mastication are normal. The face is symmetric. The palate elevates in the midline. Hearing intact. Voice is normal. Shoulder shrug is normal. The tongue has normal motion without fasciculations.   Coordination:    Normal finger to nose and heel to shin. Normal rapid alternating movements.   Gait:    Heel-toe and tandem gait are normal.   Motor Observation:    No asymmetry, no atrophy, and no involuntary movements noted. Tone:    Normal muscle tone.    Posture:    Posture is normal. normal erect    Strength:    Strength is V/V in the upper and lower limbs.      Sensation: intact to LT     Reflex Exam:  DTR's:    Deep tendon reflexes in the upper and lower extremities are normal bilaterally.   Toes:    The toes are downgoing bilaterally.     Clonus:    Clonus is absent.     Assessment/Plan:  12 yar old with intractable migraines  - MRI brain w/wo contrast due to concerning symptoms of intractable headache, positional headache, vision changes to evaluate for intracranial hypertension, chiari, space occupying mass or any other etiologies.  -Discussed botox she would like Aimovig initially for migraineprevention  - Continue Topiramate  - flexeril for neck and muscle spasms  - Zomig nasal spray for acute management  Orders Placed This Encounter  Procedures  . MR BRAIN W WO CONTRAST    Discussed: To prevent or relieve headaches, try the following: Cool Compress. Lie down and place a cool compress on your head.  Avoid headache triggers. If certain foods or odors seem to have triggered your migraines in the past, avoid them. A headache diary might help you identify triggers.  Include physical activity in your daily routine. Try a daily walk or other moderate aerobic exercise.  Manage stress. Find healthy ways to cope with the stressors, such as delegating tasks on your to-do list.  Practice relaxation techniques. Try deep breathing, yoga, massage and visualization.  Eat regularly. Eating regularly scheduled meals and maintaining a healthy diet might help prevent headaches. Also, drink plenty of fluids.  Follow a regular sleep schedule. Sleep deprivation might contribute to headaches Consider biofeedback. With this mind-body technique, you learn to control certain bodily functions - such as muscle tension, heart rate and blood pressure - to prevent headaches or reduce headache pain.    Proceed to emergency room if you experience new or worsening symptoms or symptoms do not resolve, if you have new neurologic symptoms or if headache is severe, or for any concerning symptom.   Provided education and documentation from American headache Society toolbox including articles on: chronic migraine medication overuse headache, chronic  migraines, prevention of migraines, behavioral and other nonpharmacologic treatments for headache.    Sarina Ill, MD  [facility] [address] [place], [place] [address]  Phone [phone] Fax [phone]

## 2017-08-14 ENCOUNTER — Telehealth: Payer: Self-pay | Admitting: Neurology

## 2017-08-14 ENCOUNTER — Telehealth: Payer: Self-pay

## 2017-08-14 ENCOUNTER — Other Ambulatory Visit: Payer: Self-pay | Admitting: Neurology

## 2017-08-14 DIAGNOSIS — G43711 Chronic migraine without aura, intractable, with status migrainosus: Secondary | ICD-10-CM | POA: Insufficient documentation

## 2017-08-14 MED ORDER — ALPRAZOLAM 0.25 MG PO TABS: ORAL_TABLET | ORAL | 0 refills | Status: DC

## 2017-08-14 MED FILL — ALPRAZolam 0.25 MG TABS: 0.25 | 1 days supply | Qty: 4 | Fill #0

## 2017-08-14 NOTE — Telephone Encounter (Signed)
Patient returned my call. She is scheduled for 08/22/17 at [facility]. She informed me she is claustrophobic and will need something to calm her nerves.

## 2017-08-14 NOTE — Telephone Encounter (Signed)
Sent to  thanks

## 2017-08-14 NOTE — Telephone Encounter (Signed)
lvm for pt to call back about scheduling mri [facility] Auth: [place] Ref # [id number]

## 2017-08-14 NOTE — Telephone Encounter (Signed)
Patient is scheduled for 08/22/17 at [facility].

## 2017-08-14 NOTE — Telephone Encounter (Signed)
RN unable to do PA on cover my meds. It was not recognizing patients demographic information. Rn did PA on paper version from [employer]. PA form fax back to  [phone] confirmed, and receive.

## 2017-08-14 NOTE — Telephone Encounter (Signed)
Patient is returning your call.  

## 2017-08-15 NOTE — Telephone Encounter (Signed)
Pt called back, RN Bethany asked that pt be made aware that The PA is currently in process and we have requested an urgent review. Pt was thankful for this update and will wait to be contacted further.  No call back requested at this time

## 2017-08-15 NOTE — Telephone Encounter (Signed)
Rn call Valeria. Aaron Edelman stated the pts med plan is with [employer]. Aaron Edelman gave number of  [phone] for PA of meds.

## 2017-08-15 NOTE — Telephone Encounter (Signed)
RN call patient. Pt stated she does have UMR insurance,and verify the member ID at [id number]. Rn stated a phone call will be made again. Pt verbalized understanding.

## 2017-08-15 NOTE — Telephone Encounter (Signed)
Rn spoke with [place] in accounting. Rn stated [employer] stated pts card was not active. Angie stated according to her system,, pt insurance is active.

## 2017-08-15 NOTE — Telephone Encounter (Signed)
PA done with [employer] at [phone]. PA done with [facility] via phone. PA number is [phone], urgent PA. Respone within 24 hours.

## 2017-08-15 NOTE — Telephone Encounter (Signed)
Rn receive fax that they were unable to locate patient. They requested insurance card, and original fax to be sent again. Rn fax office notes, insurance card, and [employer] paper version PA again to [phone]. Fax receive and confirmed.

## 2017-08-15 NOTE — Telephone Encounter (Signed)
Rn call [employer] at [phone]. Rn stated insurance card was fax. Customer service stated pt does not have a active insurance plan active.

## 2017-08-16 ENCOUNTER — Encounter: Payer: Self-pay | Admitting: Neurology

## 2017-08-16 NOTE — Telephone Encounter (Addendum)
Received clarification request from [place] asking if Aimovig is being prescribed for the preventive treatment of migraines. They have asked for call back or fax asap to clarify.   Patient has chronic migraine. This information including office note was faxed to [place] per request. Received a receipt of confirmation.

## 2017-08-17 ENCOUNTER — Encounter: Payer: Self-pay | Admitting: *Deleted

## 2017-08-17 NOTE — Telephone Encounter (Signed)
Received fax from [place]. Aimovig has been approved from 08/11/17-02/09/2018.  Letter faxed to pharmacy and patient emailed.  Received a receipt of confirmation.

## 2017-08-22 ENCOUNTER — Ambulatory Visit: Payer: 59

## 2017-08-22 DIAGNOSIS — R51 Headache with orthostatic component, not elsewhere classified: Secondary | ICD-10-CM

## 2017-08-22 DIAGNOSIS — H539 Unspecified visual disturbance: Secondary | ICD-10-CM | POA: Diagnosis not present

## 2017-08-22 DIAGNOSIS — G8929 Other chronic pain: Secondary | ICD-10-CM

## 2017-08-22 DIAGNOSIS — R519 Headache, unspecified: Secondary | ICD-10-CM

## 2017-08-22 MED ORDER — GADOPENTETATE DIMEGLUMINE 469.01 MG/ML IV SOLN
15.0000 mL | Freq: Once | INTRAVENOUS | Status: AC | PRN
Start: 2017-08-22 — End: 2017-08-22
  Administered 2017-08-22: 15 mL via INTRAVENOUS

## 2017-08-24 ENCOUNTER — Ambulatory Visit (INDEPENDENT_AMBULATORY_CARE_PROVIDER_SITE_OTHER): Payer: Self-pay | Admitting: Nurse Practitioner

## 2017-08-24 VITALS — BP 95/70 | HR 84 | Temp 98.1°F | Resp 16 | Wt 150.0 lb

## 2017-08-24 DIAGNOSIS — R300 Dysuria: Secondary | ICD-10-CM

## 2017-08-24 DIAGNOSIS — N390 Urinary tract infection, site not specified: Secondary | ICD-10-CM

## 2017-08-24 LAB — POCT URINALYSIS DIPSTICK
Bilirubin, UA: NEGATIVE
Blood, UA: NEGATIVE
Glucose, UA: NEGATIVE
Ketones, UA: 5
NITRITE UA: NEGATIVE
PH UA: 5 (ref 5.0–8.0)
PROTEIN UA: POSITIVE — AB
Spec Grav, UA: 1.015 (ref 1.010–1.025)
UROBILINOGEN UA: 0.2 U/dL

## 2017-08-24 MED ORDER — NITROFURANTOIN MONOHYD MACRO 100 MG PO CAPS: 100.0000 mg | ORAL_CAPSULE | Freq: Two times a day (BID) | ORAL | 0 refills | Status: AC

## 2017-08-24 MED FILL — NITROFURANTOIN MONO-MCR 100: 100 | 5 days supply | Qty: 10 | Fill #0

## 2017-08-24 NOTE — Progress Notes (Signed)
Please call and advise the patient that the recent brain MRI with and without contrast showed normal for age findings.  Star Age, MD, PhD [facility])

## 2017-08-24 NOTE — Progress Notes (Signed)
Subjective:    [name] is a 32 y.o. female who complains of abnormal smelling urine, foul smelling urine, frequency and urgency for 5 days.  Patient denies back pain, congestion, fever, stomach ache and vaginal discharge.  Patient does not have a history of recurrent UTI.  Patient does not have a history of pyelonephritis.    The following portions of the patient's history were reviewed and updated as appropriate: allergies, current medications and past medical history. Review of Systems Constitutional: negative Eyes: negative Respiratory: negative Cardiovascular: negative Gastrointestinal: negative Genitourinary:positive for frequency and urgency, cloudy urine, foul-smelling urine, negative for vaginal discharge, decreased stream, hesitancy, nocturia and urinary incontinence    Objective:    BP 95/70 (BP Location: Right Arm, Patient Position: Sitting, Cuff Size: Normal)   Pulse 84   Temp 98.1 F (36.7 C) (Oral)   Resp 16   Wt 150 lb (68 kg)   SpO2 96%   BMI 27.44 kg/m  General: alert, cooperative and no distress  Abdomen: soft, non-tender, without masses or organomegaly in the entire abdomen  Back: back muscles are full ROM, CVA tenderness absent  GU: defer exam   Laboratory:  Urine dipstick shows sp gravity 1.015, negative for glucose, negative for hemoglobin, 5.0 for ketones, large for leukocyte esterase, negative for nitrites, trace for protein and 0.2 for urobilinogen.   Micro exam: not done.    Assessment:    Acute cystitis    Plan:    1. Medications: nitrofurantoin 2. Maintain adequate hydration 3.  Instructed patient to avoid caffeine while her symptoms symptoms persist.  This includes sodas, caffeine, teas.  Patient instructed to void every 2 hours.  Patient was also instructed to void within 15 to 30 minutes after sexual intercourse. 4.  Patient education was provided. 5.  Patient to follow-up as needed.  Patient verbalizes understanding and has no  questions at time of discharge.  Follow up if symptoms not improving, and prn.   Meds ordered this encounter  Medications  . nitrofurantoin, macrocrystal-monohydrate, (MACROBID) 100 MG capsule    Sig: Take 1 capsule (100 mg total) by mouth 2 (two) times daily for 5 days.    Dispense:  10 capsule    Refill:  0    Order Specific Question:   Supervising Provider    Answer:   Ricard Dillon [phone]

## 2017-08-24 NOTE — Patient Instructions (Signed)

## 2017-08-25 ENCOUNTER — Telehealth: Payer: Self-pay

## 2017-08-25 DIAGNOSIS — H5203 Hypermetropia, bilateral: Secondary | ICD-10-CM | POA: Diagnosis not present

## 2017-08-25 DIAGNOSIS — I82622 Acute embolism and thrombosis of deep veins of left upper extremity: Secondary | ICD-10-CM

## 2017-08-25 NOTE — Telephone Encounter (Signed)
-----   Message from Star Age, MD sent at 08/24/2017  5:02 PM EDT ----- Please call and advise the patient that the recent brain MRI with and without contrast showed normal for age findings.  Star Age, MD, PhD [facility])

## 2017-08-25 NOTE — Telephone Encounter (Signed)
I called pt, left a detailed message on pt's home number, per DPR, advising her of normal MRI for age. I asked pt to call us back with any questions or concerns.

## 2017-08-28 ENCOUNTER — Telehealth: Payer: Self-pay | Admitting: Family Medicine

## 2017-08-28 ENCOUNTER — Encounter: Payer: Self-pay | Admitting: Family Medicine

## 2017-08-28 ENCOUNTER — Other Ambulatory Visit: Payer: Self-pay | Admitting: Neurology

## 2017-08-28 NOTE — Addendum Note (Signed)
Addended by: Gildardo Griffes on: 08/28/2017 04:02 PM   Modules accepted: Orders

## 2017-08-28 NOTE — Telephone Encounter (Signed)
Called pt to discuss recent MyChart message. I felt it would be better to have a conversation with the patient to explain why she needs an office visit for Dr Birdie Riddle to evaluate her arm and provide a proper referral based on an exam.   LMVM to call me back if she had any questions and urged her to get an appointment sooner rather than later.

## 2017-08-28 NOTE — Telephone Encounter (Addendum)
Dr. Jaynee Eagles made aware. If signs of infection, red striations, discharge, fever, proceed to ED. Apply warm/heat pack to area and rest arm. We will also order an ultrasound.   Called pt and discussed all of the above. She verbalized understanding and appreciation. She is aware that she will receive a second call re: scheduling.

## 2017-08-28 NOTE — Telephone Encounter (Signed)
Spoke with the patient. She said they did the MRI on Tuesday and used her L upper arm to inject the contrast. She stated that just above the site it is red, warm to the touch, painful, swollen, and has a slight purple-ish look to it, more red than anything. Advised pt to let the arm rest, no BPs on that arm or pressure, no massaging, etc. She verbalized understanding. Will d/w Dr. Jaynee Eagles and return pt's call. She verbalized appreciation.

## 2017-08-28 NOTE — Telephone Encounter (Signed)
Pt has called asking to speak with RN Romelle Starcher to inform her that she thinks she has a DVT where she was given contrast for her MRI.  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  RN Romelle Starcher was not available to speak with pt but did state she would make Dr Jaynee Eagles aware and suggested pt f/u with her PCP.  Pt wanted it known that she has a family history of DVT's and would like to be called back as son as RN Romelle Starcher is available to do to.  Please call pt at [phone]

## 2017-08-29 ENCOUNTER — Other Ambulatory Visit: Payer: Self-pay | Admitting: Neurology

## 2017-08-29 ENCOUNTER — Ambulatory Visit (HOSPITAL_COMMUNITY)
Admission: RE | Admit: 2017-08-29 | Discharge: 2017-08-29 | Disposition: A | Discharge status: Home / Self Care | Payer: 59 | Source: Ambulatory Visit | Attending: Neurology | Admitting: Neurology

## 2017-08-29 DIAGNOSIS — I82622 Acute embolism and thrombosis of deep veins of left upper extremity: Secondary | ICD-10-CM | POA: Diagnosis not present

## 2017-08-29 DIAGNOSIS — I82612 Acute embolism and thrombosis of superficial veins of left upper extremity: Secondary | ICD-10-CM | POA: Insufficient documentation

## 2017-08-29 MED ORDER — TRAMADOL HCL 50 MG PO TABS: 100.0000 mg | ORAL_TABLET | Freq: Four times a day (QID) | ORAL | 0 refills | Status: DC | PRN

## 2017-08-29 MED FILL — traMADol HCL 50 MG TABS: 50 | 3 days supply | Qty: 30 | Fill #0

## 2017-08-29 NOTE — Telephone Encounter (Signed)
Auth complete patient is going for her VAS [place] UPPER EXTREMITY VENOUS DUPLEX . Today . At 11:00 am Patient aware. Thanks Hinton Dyer.

## 2017-08-29 NOTE — Progress Notes (Signed)
*  Preliminary Results* Left upper extremity venous duplex completed. Left upper extremity is negative for deep vein thrombosis. 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   Preliminary results discussed with Dr. Jaynee Eagles.  08/29/2017 11:18 AM  Maudry Mayhew, BS, RVT, RDCS, RDMS

## 2017-08-29 NOTE — Telephone Encounter (Signed)
Noted & Dr. Jaynee Eagles is also aware.

## 2017-09-04 ENCOUNTER — Other Ambulatory Visit: Payer: Self-pay | Admitting: Neurology

## 2017-09-04 ENCOUNTER — Encounter: Payer: Self-pay | Admitting: Family Medicine

## 2017-09-04 MED ORDER — SCOPOLAMINE 1 MG/3DAYS TD PT72: 1.0000 | MEDICATED_PATCH | TRANSDERMAL | 0 refills | Status: DC

## 2017-09-04 MED FILL — TROKENDI XR 200 MG CAPSULE: 200 | 30 days supply | Qty: 30 | Fill #1

## 2017-09-04 MED FILL — ESCITALOPRAM 20 MG TABLET: 20 | 30 days supply | Qty: 30 | Fill #6

## 2017-09-04 MED FILL — AIMOVIG 140 MG/ML SOAJ: 140 | 30 days supply | Qty: 1 | Fill #0

## 2017-09-12 ENCOUNTER — Encounter: Payer: Self-pay | Admitting: Neurology

## 2017-09-19 DIAGNOSIS — Z0289 Encounter for other administrative examinations: Secondary | ICD-10-CM

## 2017-09-21 ENCOUNTER — Telehealth: Payer: Self-pay | Admitting: *Deleted

## 2017-09-21 ENCOUNTER — Encounter: Payer: Self-pay | Admitting: *Deleted

## 2017-09-21 NOTE — Telephone Encounter (Addendum)
[employer] FMLA forms completed, signed & sent to medical records for processing. Attached office visit from 08/11/17.

## 2017-09-25 ENCOUNTER — Telehealth: Payer: Self-pay | Admitting: *Deleted

## 2017-09-25 MED FILL — NUVARING VAGINAL RING: 0.12-0.015 | 84 days supply | Qty: 3 | Fill #0

## 2017-09-25 NOTE — Telephone Encounter (Signed)
I faxed pt FMLA form today to [employer], pt also e mail a release form.

## 2017-09-27 MED FILL — CYCLOBENZAPRINE HCL 10 MG T: 10 | 30 days supply | Qty: 90 | Fill #1

## 2017-09-27 MED FILL — SPIRONOLACTONE 50 MG TABS: 50 | 60 days supply | Qty: 120 | Fill #1

## 2017-09-27 MED FILL — DESLORATADINE 5 MG TABLET: 5 | 90 days supply | Qty: 90 | Fill #1

## 2017-09-27 MED FILL — BUTALB-ACETAMIN-CAFF 50-325: 50-325-40 | 22 days supply | Qty: 45 | Fill #1

## 2017-09-27 MED FILL — SCOPOLAMINE 1 MG/3DAYS PT72: 1 | 12 days supply | Qty: 4 | Fill #0

## 2017-10-10 MED FILL — AIMOVIG 140 MG/ML SOAJ: 140 | 30 days supply | Qty: 1 | Fill #1

## 2017-11-27 MED FILL — TROKENDI XR 200 MG CAPSULE: 200 | 30 days supply | Qty: 30 | Fill #2

## 2017-11-27 MED FILL — AIMOVIG 140 MG/ML SOAJ: 140 | 30 days supply | Qty: 1 | Fill #2

## 2017-11-29 MED FILL — BUTALB-ACETAMIN-CAFF 50-325: 50-325-40 | 22 days supply | Qty: 45 | Fill #2

## 2017-11-29 MED FILL — SUMAtriptan SUCCINATE 100 M: 100 | 30 days supply | Qty: 9 | Fill #1

## 2017-12-07 ENCOUNTER — Ambulatory Visit (INDEPENDENT_AMBULATORY_CARE_PROVIDER_SITE_OTHER): Payer: Self-pay | Admitting: Nurse Practitioner

## 2017-12-07 VITALS — BP 125/82 | HR 89 | Temp 98.7°F | Ht 62.0 in | Wt 147.0 lb

## 2017-12-07 DIAGNOSIS — N390 Urinary tract infection, site not specified: Secondary | ICD-10-CM

## 2017-12-07 LAB — POCT URINALYSIS DIPSTICK
Bilirubin, UA: NEGATIVE
Glucose, UA: NEGATIVE
Ketones, UA: NEGATIVE
Leukocytes, UA: NEGATIVE
Nitrite, UA: NEGATIVE
Protein, UA: POSITIVE — AB
Spec Grav, UA: 1.025 (ref 1.010–1.025)
Urobilinogen, UA: 1 E.U./dL
pH, UA: 5 (ref 5.0–8.0)

## 2017-12-07 MED ORDER — SULFAMETHOXAZOLE-TRIMETHOPRIM 800-160 MG PO TABS: 1.0000 | ORAL_TABLET | Freq: Two times a day (BID) | ORAL | 0 refills | Status: AC

## 2017-12-07 MED ORDER — PHENAZOPYRIDINE HCL 100 MG PO TABS: 100.0000 mg | ORAL_TABLET | Freq: Three times a day (TID) | ORAL | 0 refills | Status: AC | PRN

## 2017-12-07 MED FILL — SULFAMETHOXAZOLE-TMP DS TAB: 800-160 | 3 days supply | Qty: 6 | Fill #0

## 2017-12-07 MED FILL — PHENAZOPYRIDINE 100 MG TAB: 100 | 3 days supply | Qty: 10 | Fill #0

## 2017-12-07 NOTE — Progress Notes (Signed)
Subjective:    Michele Mcbride is a 32 y.o. female who complains of burning with urination, frequency and left flank pain for 2 days.  Patient denies back pain, fever, stomach ache and vaginal discharge.  Patient does not have a history of recurrent UTI.  Patient does not have a history of pyelonephritis.  The following portions of the patient's history were reviewed and updated as appropriate: allergies, current medications and past medical history.  Review of Systems Constitutional: negative Eyes: negative Ears, nose, mouth, throat, and face: negative Respiratory: negative Cardiovascular: negative Gastrointestinal: negative Genitourinary:positive for dysuria, frequency, hesitancy and See HPI, negative for vaginal discharge, hematuria, nocturia and urinary incontinence    Objective:    BP 125/82 (BP Location: Left Arm, Patient Position: Sitting)   Pulse 89   Temp 98.7 F (37.1 C)   Ht 5\' 2"  (1.575 m)   Wt 147 lb (66.7 kg)   BMI 26.89 kg/m  General: alert, cooperative and no distress  Abdomen: soft, non-tender, without masses or organomegaly in the entire abdomen  Back: back muscles are full ROM, bilateral CVA tenderness, worse on left  GU: defer exam   Laboratory:  Urine dipstick shows negative for glucose, moderate for hemoglobin, negative for ketones, negative for leukocyte esterase, negative for nitrites, trace for protein and 0.2 for urobilinogen.   Micro exam: not done.    Assessment:    Acute cystitis    Plan:   Exam findings, diagnosis etiology and medication use and indications reviewed with patient. Follow- Up and discharge instructions provided. No emergent/urgent issues found on exam. Patient education was provided. Patient verbalized understanding of information provided and agrees with plan of care (POC), all questions answered. The patient is advised to call or return to clinic if condition does not see an improvement in symptoms, or to seek the care of the  closest emergency department if condition worsens with the above plan.   1. Acute UTI -- POCT Urinalysis Dipstick - sulfamethoxazole-trimethoprim (BACTRIM DS) 800-160 MG tablet; Take 1 tablet by mouth 2 (two) times daily for 3 days.  Dispense: 6 tablet; Refill: 0 - phenazopyridine (PYRIDIUM) 100 MG tablet; Take 1 tablet (100 mg total) by mouth 3 (three) times daily as needed for up to 3 days for pain.  Dispense: 10 tablet; Refill: 0 -Take medications as prescribed. -Increase fluids. -Ibuprofen or Tylenol for pain, fever, or general discomfort. -Avoid caffeine to include teas, soda, and coffee. -Toileting schedule to toilet at least every 2 hours. -Follow-up with PCP as discussed if symptoms do not improve for a urine culture.

## 2017-12-07 NOTE — Patient Instructions (Addendum)
Urinary Tract Infection, Adult -Take medications as prescribed. -Increase fluids. -Ibuprofen or Tylenol for pain, fever, or general discomfort. -Avoid caffeine to include teas, soda, and coffee. -Toileting schedule to toilet at least every 2 hours. -Follow-up with PCP as discussed if symptoms do not improve for a urine culture.   A urinary tract infection (UTI) is an infection of any part of the urinary tract, which includes the kidneys, ureters, bladder, and urethra. These organs make, store, and get rid of urine in the body. UTI can be a bladder infection (cystitis) or kidney infection (pyelonephritis). What are the causes? This infection may be caused by fungi, viruses, or bacteria. Bacteria are the most common cause of UTIs. This condition can also be caused by repeated incomplete emptying of the bladder during urination. What increases the risk? This condition is more likely to develop if:  You ignore your need to urinate or hold urine for long periods of time.  You do not empty your bladder completely during urination.  You wipe back to front after urinating or having a bowel movement, if you are female.  You are uncircumcised, if you are female.  You are constipated.  You have a urinary catheter that stays in place (indwelling).  You have a weak defense (immune) system.  You have a medical condition that affects your bowels, kidneys, or bladder.  You have diabetes.  You take antibiotic medicines frequently or for long periods of time, and the antibiotics no longer work well against certain types of infections (antibiotic resistance).  You take medicines that irritate your urinary tract.  You are exposed to chemicals that irritate your urinary tract.  You are female.  What are the signs or symptoms? Symptoms of this condition include:  Fever.  Frequent urination or passing small amounts of urine frequently.  Needing to urinate urgently.  Pain or burning with  urination.  Urine that smells bad or unusual.  Cloudy urine.  Pain in the lower abdomen or back.  Trouble urinating.  Blood in the urine.  Vomiting or being less hungry than normal.  Diarrhea or abdominal pain.  Vaginal discharge, if you are female.  How is this diagnosed? This condition is diagnosed with a medical history and physical exam. You will also need to provide a urine sample to test your urine. Other tests may be done, including:  Blood tests.  Sexually transmitted disease (STD) testing.  If you have had more than one UTI, a cystoscopy or imaging studies may be done to determine the cause of the infections. How is this treated? Treatment for this condition often includes a combination of two or more of the following:  Antibiotic medicine.  Other medicines to treat less common causes of UTI.  Over-the-counter medicines to treat pain.  Drinking enough water to stay hydrated.  Follow these instructions at home:  Take over-the-counter and prescription medicines only as told by your health care provider.  If you were prescribed an antibiotic, take it as told by your health care provider. Do not stop taking the antibiotic even if you start to feel better.  Avoid alcohol, caffeine, tea, and carbonated beverages. They can irritate your bladder.  Drink enough fluid to keep your urine clear or pale yellow.  Keep all follow-up visits as told by your health care provider. This is important.  Make sure to: ? Empty your bladder often and completely. Do not hold urine for long periods of time. ? Empty your bladder before and after sex. ?  Wipe from front to back after a bowel movement if you are female. Use each tissue one time when you wipe. Contact a health care provider if:  You have back pain.  You have a fever.  You feel nauseous or vomit.  Your symptoms do not get better after 3 days.  Your symptoms go away and then return. Get help right away  if:  You have severe back pain or lower abdominal pain.  You are vomiting and cannot keep down any medicines or water. This information is not intended to replace advice given to you by your health care provider. Make sure you discuss any questions you have with your health care provider. Document Released: 12/01/2004 Document Revised: 08/05/2015 Document Reviewed: 01/12/2015 Elsevier Interactive Patient Education  [employer].

## 2017-12-11 ENCOUNTER — Encounter: Payer: Self-pay | Admitting: Neurology

## 2017-12-11 ENCOUNTER — Ambulatory Visit: Payer: 59 | Admitting: Neurology

## 2017-12-11 VITALS — BP 119/81 | HR 86 | Ht 62.0 in | Wt 147.2 lb

## 2017-12-11 DIAGNOSIS — M7918 Myalgia, other site: Secondary | ICD-10-CM

## 2017-12-11 DIAGNOSIS — G43711 Chronic migraine without aura, intractable, with status migrainosus: Secondary | ICD-10-CM | POA: Diagnosis not present

## 2017-12-11 MED ORDER — ONDANSETRON HCL 4 MG PO TABS: 4.0000 mg | ORAL_TABLET | Freq: Three times a day (TID) | ORAL | 11 refills | Status: DC | PRN

## 2017-12-11 MED FILL — ONDANSETRON HCL 4 MG TABLET: 4 | 10 days supply | Qty: 30 | Fill #0

## 2017-12-11 NOTE — Patient Instructions (Signed)
Dry needling for cervical myofascial pain  Trigger Point Dry Needling   What is Trigger Point Dry Needling (DN)?   1. DN is a physical therapy technique used to treat muscle pain and     Dysfunction.  Specifically, DN helps deactivate muscle trigger point (Muscle Knots).   2. A thin filiform needle is used to penetrate the skin and stimulate the underlying trigger point.  The goal is for a local twitch  response (LTR) to occur and for the trigger point to relax.  No medication of any kind is injected during the procedure.   What Does Trigger Point Dry Needling Feel Like?   1. The procedures feels different for each individual patient.   Some    patients report that they do not actually feel the      needle enter the skin and overall the process is not  painful.  Very    mild bleeding may occur.  However, many patients feel a deep    cramping in the muscle in which the needle was inserted. This is t   he local twitch response.    How Will I Feel After The Treatment?   1. Soreness is normal, and the onset of soreness may not occur for    a few hours.  Typically this soreness does not last longer than two    days.   2. Bruising is uncommon, however; ice can be used to decrease any    possible bruising.   3. In rare cases feeling tired or nauseous after the treatment is    normal.  In addition, your symptoms may get worse before they    get better, this period will typically not last longer than 24 hours.   What Can I do After My Treatment?   1.  Increase your hydration by drinking more water for the next 24    hours.   2.  You may place ice or heat on the areas treated that have become    sore, however don not use heat on inflamed or bruised areas.     Heat often brings more relief post needling.   3. You can continue your regular activities, but vigorous activity is    not recommended initially after the treatment for 24 hours.   4. DN is best combined with other physical therapy such as      strengthening, stretching, and other therapies.

## 2017-12-11 NOTE — Progress Notes (Signed)
[facility]    Provider:  Dr Jaynee Eagles Referring Provider: Dr. Patrecia Pour Primary Care Physician:  Dr. Patrecia Pour  CC:  Migraines  Interval history 12/11/2017: She is under a lot of stress, her parents moved in with her. She supports 5 people. Her ex-husband lost his job and so decreased child support. She is on Aimovig. She does not want to have botox completed. Discussed strategies for stress management. She feels the Aimovig is improving her migraines for now. Will continue on the Trokendi.  HPI:  Michele Mcbride is a 32 y.o. female here as a referral from Dr. Birdie Riddle for migraines. Suffering since the age of 40. Started medications at the age of 37. She has seen previous neurologists. She wakes with migraines and worse bending over and with valsalva. Can take her out of work for 3 days. She has light sensitivity, sound sensitivity, nausea, vomiting, worse with movement, its unilateral either side may spread to the whole head, pounding throbbing, can be severe. 22 headache days a month, 5 severe migraine days, 5-7 moderately severe migraines, 10 mild migraines or dull headaches. Can worsen with bending over and valsalva. No aura, + vision changes, no weakness or numbness. No medication overuse. This frequency and severity has been ongoing for a year and intractable. Mother, sister,2 aunts, cousin, grandmother have migraines. She has associated neck pain/ No other focal neurologic deficits, associated symptoms, inciting events or modifiable factors. She has examined food triggers, red wine trigers, garlic, sleeping well and getting enough sleep, exercises. No other focal neurologic deficits, associated symptoms, inciting events or modifiable factors.  Medications tried: Topamax currently, propranolol, fioricetl, exapro, zofran, imitrex, axert, relpax, has been on Topamax 200mg , reglan, robaxin, tizanidine, Lexapro  Reviewed notes, labs and imaging from outside physicians, which  showed:  TSH normal  Dg cervical spine 2004: reviewed report:  [place]. LUMBAR SPINE 4 VIEWS THE PATIENT HAS FIVE NON-RIB-BEARING LUMBAR VERTEBRAE.  THE ALIGNMENT IS ANATOMIC.  POSTERIOR ELEMENTS ARE INTACT.  SACROILIAC JOINTS ARE SYMMETRIC.  THE SACRUM IS NORMAL. IMPRESSION NEGATIVE FOR FRACTURE.  Review of Systems: Patient complains of symptoms per HPI as well as the following symptoms: headache. Pertinent negatives and positives per HPI. All others negative.   Social History   Socioeconomic History  . Marital status: Divorced    Spouse name: Not on file  . Number of children: 2  . Years of education: LPN  . Highest education level: Not on file  Occupational History  . Not on file  Social Needs  . Financial resource strain: Not on file  . Food insecurity:    Worry: Not on file    Inability: Not on file  . Transportation needs:    Medical: Not on file    Non-medical: Not on file  Tobacco Use  . Smoking status: Never Smoker  . Smokeless tobacco: Never Used  Substance and Sexual Activity  . Alcohol use: No  . Drug use: No  . Sexual activity: Yes    Comment: nuvaring  Lifestyle  . Physical activity:    Days per week: Not on file    Minutes per session: Not on file  . Stress: Not on file  Relationships  . Social connections:    Talks on phone: Not on file    Gets together: Not on file    Attends religious service: Not on file    Active member of club or organization: Not on file    Attends meetings of clubs or  organizations: Not on file    Relationship status: Not on file  . Intimate partner violence:    Fear of current or ex partner: Not on file    Emotionally abused: Not on file    Physically abused: Not on file    Forced sexual activity: Not on file  Other Topics Concern  . Not on file  Social History Narrative   Lives at home with her 2 children   Right handed   Caffeine: 2 cups daily at max    Family History  Problem  Relation Age of Onset  . Hyperlipidemia Mother   . Neuropathy Mother   . Migraines Mother   . Migraines Other        sisters, aunts, and grandmother had migraines on mother's side    Past Medical History:  Diagnosis Date  . Fibromyalgia   . Migraine   . PCOS (polycystic ovarian syndrome)   . Sinus infection     Past Surgical History:  Procedure Laterality Date  . WISDOM TOOTH EXTRACTION      Current Outpatient Medications  Medication Sig Dispense Refill  . albuterol (PROVENTIL HFA;VENTOLIN HFA) 108 (90 Base) MCG/ACT inhaler Inhale into the lungs every 6 (six) hours as needed for wheezing or shortness of breath.    . BELSOMRA 10 MG TABS TAKE 1 TABLET BY MOUTH AT BEDTIME (Patient taking differently: TAKE 1 TABLET BY MOUTH AT BEDTIME PRN) 30 tablet 3  . butalbital-acetaminophen-caffeine (FIORICET, ESGIC) 50-325-40 MG tablet TAKE 1 TABLET BY MOUTH TWICE A DAY AS NEEDED FOR HEADACHE 45 tablet 2  . cyclobenzaprine (FLEXERIL) 10 MG tablet Take 1 tablet (10 mg total) by mouth 3 (three) times daily as needed for muscle spasms. 90 tablet 3  . desloratadine (CLARINEX) 5 MG tablet Take 5 mg by mouth daily.    Marland Kitchen EPINEPHrine (AUVI-Q) 0.3 mg/0.3 mL IJ SOAJ injection Inject 0.3 mg into the muscle once.    Eduard Roux (AIMOVIG) 140 MG/ML SOAJ Inject 140 mg into the skin every 30 (thirty) days. 1 pen 11  . escitalopram (LEXAPRO) 20 MG tablet Take 1 tablet (20 mg total) by mouth daily. 30 tablet 6  . Etonogestrel-Ethinyl Estradiol (NUVARING VA) Place vaginally.    . ondansetron (ZOFRAN) 4 MG tablet Take 1 tablet (4 mg total) by mouth every 8 (eight) hours as needed for nausea or vomiting. 30 tablet 11  . QVAR REDIHALER 80 MCG/ACT inhaler   5  . spironolactone (ALDACTONE) 50 MG tablet Take 50 mg by mouth 2 (two) times daily.   3  . SUMAtriptan (IMITREX) 100 MG tablet TAKE 1 TABLET (100 MG TOTAL) BY MOUTH EVERY 2 (TWO) HOURS AS NEEDED FOR MIGRAINE. (MAX DOSE 2 TABS/24 HRS.) 10 tablet 6  .  Topiramate ER (TROKENDI XR) 200 MG CP24 Take 200 mg by mouth at bedtime. 30 capsule 11  . traMADol (ULTRAM) 50 MG tablet Take 2 tablets (100 mg total) by mouth every 6 (six) hours as needed. 30 tablet 0  . zolmitriptan (ZOMIG) 5 MG nasal solution Place 1 spray into the nose as needed for migraine. 12 Units 6  . ALPRAZolam (XANAX) 0.25 MG tablet Take 1-2 tablets 30-60 minutes before MRI. May repeat if needed. (Patient not taking: Reported on 12/07/2017) 4 tablet 0  . scopolamine (TRANSDERM-SCOP) 1 MG/3DAYS Place 1 patch (1.5 mg total) onto the skin every 3 (three) days. (Patient not taking: Reported on 12/07/2017) 4 patch 0   No current facility-administered medications for this visit.  Allergies as of 12/11/2017 - Review Complete 12/11/2017  Allergen Reaction Noted  . Strawberry extract Anaphylaxis 03/06/2014  . Latex Rash 04/04/2014    Vitals: BP 119/81   Pulse 86   Ht 5\' 2"  (1.575 m)   Wt 147 lb 3.2 oz (66.8 kg)   BMI 26.92 kg/m  Last Weight:  Wt Readings from Last 1 Encounters:  12/11/17 147 lb 3.2 oz (66.8 kg)   Last Height:   Ht Readings from Last 1 Encounters:  12/11/17 5\' 2"  (1.575 m)     Physical exam: Exam: Gen: NAD, conversant, well nourised, well groomed                     CV: RRR, no MRG. No Carotid Bruits. No peripheral edema, warm, nontender Eyes: Conjunctivae clear without exudates or hemorrhage  Neuro: Detailed Neurologic Exam  Speech:    Speech is normal; fluent and spontaneous with normal comprehension.  Cognition:    The patient is oriented to person, place, and time;     recent and remote memory intact;     language fluent;     normal attention, concentration,     fund of knowledge Cranial Nerves:    The pupils are equal, round, and reactive to light. The fundi are normal and spontaneous venous pulsations are present. Visual fields are full to finger confrontation. Extraocular movements are intact. Trigeminal sensation is intact and the muscles  of mastication are normal. The face is symmetric. The palate elevates in the midline. Hearing intact. Voice is normal. Shoulder shrug is normal. The tongue has normal motion without fasciculations.   Coordination:    Normal finger to nose and heel to shin. Normal rapid alternating movements.   Gait:    Heel-toe and tandem gait are normal.   Motor Observation:    No asymmetry, no atrophy, and no involuntary movements noted. Tone:    Normal muscle tone.    Posture:    Posture is normal. normal erect    Strength:    Strength is V/V in the upper and lower limbs.      Sensation: intact to LT     Reflex Exam:  DTR's:    Deep tendon reflexes in the upper and lower extremities are normal bilaterally.   Toes:    The toes are downgoing bilaterally.   Clonus:    Clonus is absent.     Assessment/Plan:  64 yar old with intractable migraines  - dry needling for cervical myofascial pain ordered  - MRI brain w/wo contrast unremarkable  -Discussed botox she would like Aimovig initially for migraineprevention. She does not want to have Botox.    - Dry needling for cervical myofascial pain  - Continue Trokendi  - Continue flexeril for neck and muscle spasms  - Zomig did not help at all. She is back on Imitrex despite the side effects, it works.    Discussed: To prevent or relieve headaches, try the following: Cool Compress. Lie down and place a cool compress on your head.  Avoid headache triggers. If certain foods or odors seem to have triggered your migraines in the past, avoid them. A headache diary might help you identify triggers.  Include physical activity in your daily routine. Try a daily walk or other moderate aerobic exercise.  Manage stress. Find healthy ways to cope with the stressors, such as delegating tasks on your to-do list.  Practice relaxation techniques. Try deep breathing, yoga, massage and visualization.  Eat regularly.  Eating regularly scheduled meals and  maintaining a healthy diet might help prevent headaches. Also, drink plenty of fluids.  Follow a regular sleep schedule. Sleep deprivation might contribute to headaches Consider biofeedback. With this mind-body technique, you learn to control certain bodily functions - such as muscle tension, heart rate and blood pressure - to prevent headaches or reduce headache pain.    Proceed to emergency room if you experience new or worsening symptoms or symptoms do not resolve, if you have new neurologic symptoms or if headache is severe, or for any concerning symptom.   Provided education and documentation from American headache Society toolbox including articles on: chronic migraine medication overuse headache, chronic migraines, prevention of migraines, behavioral and other nonpharmacologic treatments for headache.   A total of 15 minutes was spent face-to-face with this patient. Over half this time was spent on counseling patient on the  1. Chronic migraine without aura, with intractable migraine, so stated, with status migrainosus   2. Cervical myofascial pain syndrome    diagnosis and different diagnostic and therapeutic options, counseling and coordination of care, risks ans benefits of management, compliance, or risk factor reduction and education.    Sarina Ill, MD  [facility] [address] [place], [place] [address]  Phone [phone] Fax [phone]

## 2017-12-27 ENCOUNTER — Other Ambulatory Visit: Payer: Self-pay | Admitting: Neurology

## 2017-12-27 MED ORDER — METHYLPREDNISOLONE 4 MG PO TBPK: ORAL_TABLET | ORAL | 1 refills | Status: DC

## 2017-12-27 NOTE — Progress Notes (Signed)
medrol 

## 2017-12-28 ENCOUNTER — Ambulatory Visit (INDEPENDENT_AMBULATORY_CARE_PROVIDER_SITE_OTHER): Payer: 59 | Admitting: Neurology

## 2017-12-28 DIAGNOSIS — G43919 Migraine, unspecified, intractable, without status migrainosus: Secondary | ICD-10-CM

## 2017-12-28 DIAGNOSIS — G43711 Chronic migraine without aura, intractable, with status migrainosus: Secondary | ICD-10-CM

## 2017-12-28 MED ORDER — KETOROLAC TROMETHAMINE 60 MG/2ML IM SOLN
60.0000 mg | Freq: Once | INTRAMUSCULAR | Status: AC
  Administered 2017-12-28: 60 mg via INTRAMUSCULAR

## 2017-12-28 MED ORDER — METHYLPREDNISOLONE ACETATE 80 MG/ML IJ SUSP
80.0000 mg | Freq: Once | INTRAMUSCULAR | Status: AC
  Administered 2017-12-28: 80 mg via INTRAMUSCULAR

## 2017-12-28 MED FILL — METHYLPREDNISOLONE 4 MG TAB: 4 | 6 days supply | Qty: 21 | Fill #0

## 2017-12-28 NOTE — Telephone Encounter (Signed)
Called pt and offered nerve block appt today at noon or 4 pm. Pt scheduled for noon per pt choice.

## 2017-12-28 NOTE — Progress Notes (Signed)
Nerve block w/o steroid: Pt signed consent  0.5% Bupivocaine 12 mL LOT: [id number] EXP: 09/22 NDC: 33744-514-60  2% Lidocaine 12 mL LOT: [id number] EXP: 10/06/2018 NDC: 4799-8721-58  Patient given Toradol 60 mg IM & Depomedrol 80 mg IM injections per order. Bandaids applied. See MAR.  14:06 Pt escorted to infusion suite for Depacon 1 gram IV x 1 per MD order. Pt was also given Zofran 8 mg PO x 1 by MD during office visit. Patient feeling better after infusion reported pain nearly 0/10.

## 2017-12-29 NOTE — Progress Notes (Signed)
Performed by Dr. Jaynee Eagles M.D. All procedures a documented blood were medically necessary, reasonable and appropriate based on the patient's history, medical diagnosis and physician opinion. Verbal informed consent was obtained from the patient, patient was informed of potential risk of procedure, including bruising, bleeding, hematoma formation, infection, muscle weakness, muscle pain, numbness, transient hypertension, transient hyperglycemia and transient insomnia among others. All areas injected were topically clean with isopropyl rubbing alcohol. Nonsterile nonlatex gloves were worn during the procedure.  Pain decreased from 10/10 to 0/10.   1. Greater occipital nerve block [phone]). The greater occipital nerve site was identified at the nuchal line medial to the occipital artery. Medication was injected into the left and right occipital nerve areas and suboccipital areas. Patient's condition is associated with inflammation of the greater occipital nerve and associated multiple groups. Injection was deemed medically necessary, reasonable and appropriate. Injection represents a separate and unique surgical service.  2. Supraorbital nerve block (64400): Supraorbital nerve site was identified along the incision of the frontal bone on the orbital/supraorbital ridge. Medication was injected into the left and right supraorbital nerve areas. Patient's condition is associated with inflammation of the supraorbital and associated muscle groups. Injection was deemed medically necessary, reasonable and appropriate. Injection represents a separate and unique surgical service.

## 2018-01-01 ENCOUNTER — Other Ambulatory Visit: Payer: Self-pay | Admitting: Family Medicine

## 2018-01-01 MED FILL — CYCLOBENZAPRINE HCL 10 MG T: 10 | 30 days supply | Qty: 90 | Fill #2

## 2018-01-01 MED FILL — SUMAtriptan SUCCINATE 100 M: 100 | 30 days supply | Qty: 9 | Fill #2

## 2018-01-01 MED FILL — AIMOVIG 140 MG/ML SOAJ: 140 | 30 days supply | Qty: 1 | Fill #3

## 2018-01-18 ENCOUNTER — Ambulatory Visit (INDEPENDENT_AMBULATORY_CARE_PROVIDER_SITE_OTHER): Payer: Self-pay | Admitting: Family Medicine

## 2018-01-18 ENCOUNTER — Encounter: Payer: Self-pay | Admitting: Family Medicine

## 2018-01-18 VITALS — BP 110/82 | HR 89 | Temp 99.2°F | Wt 149.0 lb

## 2018-01-18 DIAGNOSIS — R05 Cough: Secondary | ICD-10-CM

## 2018-01-18 DIAGNOSIS — Z8709 Personal history of other diseases of the respiratory system: Secondary | ICD-10-CM

## 2018-01-18 DIAGNOSIS — J040 Acute laryngitis: Secondary | ICD-10-CM

## 2018-01-18 DIAGNOSIS — R059 Cough, unspecified: Secondary | ICD-10-CM

## 2018-01-18 DIAGNOSIS — J069 Acute upper respiratory infection, unspecified: Secondary | ICD-10-CM

## 2018-01-18 MED ORDER — AMOXICILLIN 875 MG PO TABS: 875.0000 mg | ORAL_TABLET | Freq: Two times a day (BID) | ORAL | 0 refills | Status: DC

## 2018-01-18 MED ORDER — MONTELUKAST SODIUM 10 MG PO TABS: 10.0000 mg | ORAL_TABLET | Freq: Every day | ORAL | 0 refills | Status: DC

## 2018-01-18 MED ORDER — PREDNISONE 20 MG PO TABS: 40.0000 mg | ORAL_TABLET | Freq: Every day | ORAL | 0 refills | Status: AC

## 2018-01-18 MED ORDER — BENZONATATE 200 MG PO CAPS: 200.0000 mg | ORAL_CAPSULE | Freq: Three times a day (TID) | ORAL | 0 refills | Status: DC | PRN

## 2018-01-18 MED FILL — AMOXICILLIN 875 MG TABS: 875 | 10 days supply | Qty: 20 | Fill #0

## 2018-01-18 MED FILL — predniSONE 20 MG TABS: 20 | 5 days supply | Qty: 10 | Fill #0

## 2018-01-18 MED FILL — MONTELUKAST SOD 10 MG TAB: 10 | 30 days supply | Qty: 30 | Fill #0

## 2018-01-18 MED FILL — BENZONATATE 200 MG CAPS: 200 | 6 days supply | Qty: 20 | Fill #0

## 2018-01-18 NOTE — Progress Notes (Signed)
Michele Mcbride is a 32 y.o. female who presents today with concerns of sore throat, cough, congestion for 1 week. 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 She reports that she has not attempted any over the counter medication for this condition. She does report that she has chronic asthma and is on an inhaler daily and is overall well controlled she believes.  Review of Systems  Constitutional: Negative for chills, fever and malaise/fatigue.  HENT: Positive for sore throat. Negative for congestion, ear discharge, ear pain and sinus pain.        Loss of voice  Eyes: Negative.   Respiratory: Positive for cough and shortness of breath. Negative for sputum production.   Cardiovascular: Negative.  Negative for chest pain.  Gastrointestinal: Negative for abdominal pain, diarrhea, nausea and vomiting.  Genitourinary: Negative for dysuria, frequency, hematuria and urgency.  Musculoskeletal: Negative for myalgias.  Skin: Negative.   Neurological: Negative for headaches.  Endo/Heme/Allergies: Negative.   Psychiatric/Behavioral: Negative.     O: Vitals:   01/18/18 1146  BP: 110/82  Pulse: 89  Temp: 99.2 F (37.3 C)  SpO2: 97%     Physical Exam  Constitutional: She is oriented to person, place, and time. Vital signs are normal. She appears well-developed and well-nourished. She is active.  Non-toxic appearance. She does not have a sickly appearance.  HENT:  Head: Normocephalic.  Right Ear: Hearing, tympanic membrane, external ear and ear canal normal.  Left Ear: Hearing, tympanic membrane, external ear and ear canal normal.  Nose: Mucosal edema and rhinorrhea present. Right sinus exhibits no maxillary sinus tenderness and no frontal sinus tenderness. Left sinus exhibits no maxillary sinus tenderness and no frontal sinus tenderness.  Mouth/Throat: Uvula is midline. Posterior oropharyngeal erythema present.  Patient cannot speak above a  whisper.  Neck: Normal range of motion. Neck supple.  Cardiovascular: Normal rate, regular rhythm, normal heart sounds and normal pulses.  Pulmonary/Chest: Effort normal and breath sounds normal.  Abdominal: Soft. Bowel sounds are normal.  Musculoskeletal: Normal range of motion.  Lymphadenopathy:       Head (right side): Tonsillar adenopathy present. No submental and no submandibular adenopathy present.       Head (left side): Tonsillar adenopathy present. No submental and no submandibular adenopathy present.    She has no cervical adenopathy.  Neurological: She is alert and oriented to person, place, and time.  Psychiatric: She has a normal mood and affect.  Vitals reviewed.  A: 1. Laryngitis   2. Cough   3. History of asthma   4. Upper respiratory tract infection, unspecified type    P: Discussed exam findings, diagnosis etiology and medication use and indications reviewed with patient. Follow- Up and discharge instructions provided. No emergent/urgent issues found on exam.  Patient verbalized understanding of information provided and agrees with plan of care (POC), all questions answered.   Discussed supportive and symptomatic treatments like warm liquids, throat lozenges and warm salt water gargles.  1. Laryngitis - predniSONE (DELTASONE) 20 MG tablet; Take 2 tablets (40 mg total) by mouth daily with breakfast for 5 days. - amoxicillin (AMOXIL) 875 MG tablet; Take 1 tablet (875 mg total) by mouth 2 (two) times daily.  2. Cough - montelukast (SINGULAIR) 10 MG tablet; Take 1 tablet (10 mg total) by mouth at bedtime. - benzonatate (TESSALON) 200 MG capsule; Take 1 capsule (200 mg total) by mouth 3 (three) times daily as needed.  3.  History of asthma - predniSONE (DELTASONE) 20 MG tablet; Take 2 tablets (40 mg total) by mouth daily with breakfast for 5 days. - montelukast (SINGULAIR) 10 MG tablet; Take 1 tablet (10 mg total) by mouth at bedtime.  4. Upper respiratory tract  infection, unspecified type - predniSONE (DELTASONE) 20 MG tablet; Take 2 tablets (40 mg total) by mouth daily with breakfast for 5 days. - amoxicillin (AMOXIL) 875 MG tablet; Take 1 tablet (875 mg total) by mouth 2 (two) times daily.

## 2018-01-18 NOTE — Patient Instructions (Signed)
Laryngitis Laryngitis is inflammation of your vocal cords. This causes hoarseness, coughing, loss of voice, sore throat, or a dry throat. Your vocal cords are two bands of muscles that are found in your throat. When you speak, these cords come together and vibrate. These vibrations come out through your mouth as sound. When your vocal cords are inflamed, your voice sounds different. Laryngitis can be temporary (acute) or long-term (chronic). Most cases of acute laryngitis improve with time. Chronic laryngitis is laryngitis that lasts for more than three weeks. What are the causes? Acute laryngitis may be caused by:  A viral infection.  Lots of talking, yelling, or singing. This is also called vocal strain.  Bacterial infections.  Chronic laryngitis may be caused by:  Vocal strain.  Injury to your vocal cords.  Acid reflux (gastroesophageal reflux disease or GERD).  Allergies.  Sinus infection.  Smoking.  Alcohol abuse.  Breathing in chemicals or dust.  Growths on the vocal cords.  What increases the risk? Risk factors for laryngitis include:  Smoking.  Alcohol abuse.  Having allergies.  What are the signs or symptoms? Symptoms of laryngitis may include:  Low, hoarse voice.  Loss of voice.  Dry cough.  Sore throat.  Stuffy nose.  How is this diagnosed? Laryngitis may be diagnosed by:  Physical exam.  Throat culture.  Blood test.  Laryngoscopy. This procedure allows your health care provider to look at your vocal cords with a mirror or viewing tube.  How is this treated? Treatment for laryngitis depends on what is causing it. Usually, treatment involves resting your voice and using medicines to soothe your throat. However, if your laryngitis is caused by a bacterial infection, you may need to take antibiotic medicine. If your laryngitis is caused by a growth, you may need to have a procedure to remove it. Follow these instructions at home:  Drink  enough fluid to keep your urine clear or pale yellow.  Breathe in moist air. Use a humidifier if you live in a dry climate.  Take medicines only as directed by your health care provider.  If you were prescribed an antibiotic medicine, finish it all even if you start to feel better.  Do not smoke cigarettes or electronic cigarettes. If you need help quitting, ask your health care provider.  Talk as little as possible. Also avoid whispering, which can cause vocal strain.  Write instead of talking. Do this until your voice is back to normal. Contact a health care provider if:  You have a fever.  You have increasing pain.  You have difficulty swallowing. Get help right away if:  You cough up blood.  You have trouble breathing. This information is not intended to replace advice given to you by your health care provider. Make sure you discuss any questions you have with your health care provider. Document Released: 02/21/2005 Document Revised: 07/30/2015 Document Reviewed: 08/06/2013 Elsevier Interactive Patient Education  2018 [employer]vier Inc.  

## 2018-01-24 ENCOUNTER — Ambulatory Visit (INDEPENDENT_AMBULATORY_CARE_PROVIDER_SITE_OTHER): Payer: Self-pay | Admitting: Nurse Practitioner

## 2018-01-24 ENCOUNTER — Encounter: Payer: Self-pay | Admitting: Nurse Practitioner

## 2018-01-24 VITALS — BP 122/88 | HR 104 | Temp 98.7°F | Wt 155.2 lb

## 2018-01-24 DIAGNOSIS — J069 Acute upper respiratory infection, unspecified: Secondary | ICD-10-CM

## 2018-01-24 DIAGNOSIS — R05 Cough: Secondary | ICD-10-CM

## 2018-01-24 DIAGNOSIS — R52 Pain, unspecified: Secondary | ICD-10-CM

## 2018-01-24 NOTE — Patient Instructions (Signed)
Cough, Adult -Continue prescription for amoxicillin until completed. -Follow-up with your PCP for further evaluation.  You may need a chest x-ray to explore why you are having chest pain and pain with coughing and deep breathing. -Follow-up in the emergency department for worsening chest pain, shortness of breath, or difficulty breathing.   Coughing is a reflex that clears your throat and your airways. Coughing helps to heal and protect your lungs. It is normal to cough occasionally, but a cough that happens with other symptoms or lasts a long time may be a sign of a condition that needs treatment. A cough may last only 2-3 weeks (acute), or it may last longer than 8 weeks (chronic). What are the causes? Coughing is commonly caused by:  Breathing in substances that irritate your lungs.  A viral or bacterial respiratory infection.  Allergies.  Asthma.  Postnasal drip.  Smoking.  Acid backing up from the stomach into the esophagus (gastroesophageal reflux).  Certain medicines.  Chronic lung problems, including COPD (or rarely, lung cancer).  Other medical conditions such as heart failure.  Follow these instructions at home: Pay attention to any changes in your symptoms. Take these actions to help with your discomfort:  Take medicines only as told by your health care provider. ? If you were prescribed an antibiotic medicine, take it as told by your health care provider. Do not stop taking the antibiotic even if you start to feel better. ? Talk with your health care provider before you take a cough suppressant medicine.  Drink enough fluid to keep your urine clear or pale yellow.  If the air is dry, use a cold steam vaporizer or humidifier in your bedroom or your home to help loosen secretions.  Avoid anything that causes you to cough at work or at home.  If your cough is worse at night, try sleeping in a semi-upright position.  Avoid cigarette smoke. If you smoke, quit  smoking. If you need help quitting, ask your health care provider.  Avoid caffeine.  Avoid alcohol.  Rest as needed.  Contact a health care provider if:  You have new symptoms.  You cough up pus.  Your cough does not get better after 2-3 weeks, or your cough gets worse.  You cannot control your cough with suppressant medicines and you are losing sleep.  You develop pain that is getting worse or pain that is not controlled with pain medicines.  You have a fever.  You have unexplained weight loss.  You have night sweats. Get help right away if:  You cough up blood.  You have difficulty breathing.  Your heartbeat is very fast. This information is not intended to replace advice given to you by your health care provider. Make sure you discuss any questions you have with your health care provider. Document Released: 08/20/2010 Document Revised: 07/30/2015 Document Reviewed: 04/30/2014 Elsevier Interactive Patient Education  2018 [place].  Upper Respiratory Infection, Adult Most upper respiratory infections (URIs) are a viral infection of the air passages leading to the lungs. A URI affects the nose, throat, and upper air passages. The most common type of URI is nasopharyngitis and is typically referred to as "the common cold." URIs run their course and usually go away on their own. Most of the time, a URI does not require medical attention, but sometimes a bacterial infection in the upper airways can follow a viral infection. This is called a secondary infection. Sinus and middle ear infections are common types of  secondary upper respiratory infections. Bacterial pneumonia can also complicate a URI. A URI can worsen asthma and chronic obstructive pulmonary disease (COPD). Sometimes, these complications can require emergency medical care and may be life threatening. What are the causes? Almost all URIs are caused by viruses. A virus is a type of germ and can spread from one  person to another. What increases the risk? You may be at risk for a URI if:  You smoke.  You have chronic heart or lung disease.  You have a weakened defense (immune) system.  You are very young or very old.  You have nasal allergies or asthma.  You work in crowded or poorly ventilated areas.  You work in health care facilities or schools.  What are the signs or symptoms? Symptoms typically develop 2-3 days after you come in contact with a cold virus. Most viral URIs last 7-10 days. However, viral URIs from the influenza virus (flu virus) can last 14-18 days and are typically more severe. Symptoms may include:  Runny or stuffy (congested) nose.  Sneezing.  Cough.  Sore throat.  Headache.  Fatigue.  Fever.  Loss of appetite.  Pain in your forehead, behind your eyes, and over your cheekbones (sinus pain).  Muscle aches.  How is this diagnosed? Your health care provider may diagnose a URI by:  Physical exam.  Tests to check that your symptoms are not due to another condition such as: ? Strep throat. ? Sinusitis. ? Pneumonia. ? Asthma.  How is this treated? A URI goes away on its own with time. It cannot be cured with medicines, but medicines may be prescribed or recommended to relieve symptoms. Medicines may help:  Reduce your fever.  Reduce your cough.  Relieve nasal congestion.  Follow these instructions at home:  Take medicines only as directed by your health care provider.  Gargle warm saltwater or take cough drops to comfort your throat as directed by your health care provider.  Use a warm mist humidifier or inhale steam from a shower to increase air moisture. This may make it easier to breathe.  Drink enough fluid to keep your urine clear or pale yellow.  Eat soups and other clear broths and maintain good nutrition.  Rest as needed.  Return to work when your temperature has returned to normal or as your health care provider advises. You  may need to stay home longer to avoid infecting others. You can also use a face mask and careful hand washing to prevent spread of the virus.  Increase the usage of your inhaler if you have asthma.  Do not use any tobacco products, including cigarettes, chewing tobacco, or electronic cigarettes. If you need help quitting, ask your health care provider. How is this prevented? The best way to protect yourself from getting a cold is to practice good hygiene.  Avoid oral or hand contact with people with cold symptoms.  Wash your hands often if contact occurs.  There is no clear evidence that vitamin C, vitamin E, echinacea, or exercise reduces the chance of developing a cold. However, it is always recommended to get plenty of rest, exercise, and practice good nutrition. Contact a health care provider if:  You are getting worse rather than better.  Your symptoms are not controlled by medicine.  You have chills.  You have worsening shortness of breath.  You have brown or red mucus.  You have yellow or brown nasal discharge.  You have pain in your face, especially when  you bend forward.  You have a fever.  You have swollen neck glands.  You have pain while swallowing.  You have white areas in the back of your throat. Get help right away if:  You have severe or persistent: ? Headache. ? Ear pain. ? Sinus pain. ? Chest pain.  You have chronic lung disease and any of the following: ? Wheezing. ? Prolonged cough. ? Coughing up blood. ? A change in your usual mucus.  You have a stiff neck.  You have changes in your: ? Vision. ? Hearing. ? Thinking. ? Mood. This information is not intended to replace advice given to you by your health care provider. Make sure you discuss any questions you have with your health care provider. Document Released: 08/17/2000 Document Revised: 10/25/2015 Document Reviewed: 05/29/2013 Elsevier Interactive Patient Education  [employer].

## 2018-01-24 NOTE — Progress Notes (Signed)
Subjective:    Patient ID: Michele Mcbride, female    DOB: [birth year], 32 y.o.   MRN: [id number]  The patient is a 32 year old female who presents today for follow-up of her upper respiratory infection.  The patient states she has continued hoarseness, and pain in her chest with deep breathing, coughing and when at rest.  Patient also complains of swelling in her face, and neck pain.  The patient states she has worsening fatigue, and "just is not getting any better".  The patient states "I feel worse now than when I came in before".  The patient was seen on 01/18/18 and was given a prescription for amoxicillin, prednisone, Singulair, and prednisone.  The patient states she is on day 7 of the amoxicillin, and has completed all of her steroids.  Patient states she is still taking the Singulair at bedtime.  Patient states her cough has improved, and her throat is no longer sore, but she still remains hoarse.  Patient states she has been using her inhaler more often for the pain with deep breathing  I have reviewed the patient's past medical history, current medications, and allergies.   Review of Systems  Constitutional: Positive for fatigue. Negative for chills and fever.  HENT: Positive for voice change. Negative for ear pain, postnasal drip, sinus pressure, sinus pain and sore throat.   Eyes: Negative.   Respiratory: Positive for cough.   Cardiovascular: Negative.   Gastrointestinal: Negative.   Skin: Negative.       Objective:   Physical Exam  Constitutional: She is oriented to person, place, and time. She appears well-developed and well-nourished. No distress.  HENT:  Head: Normocephalic.  Right Ear: External ear normal.  Left Ear: External ear normal.  Eyes: Pupils are equal, round, and reactive to light. EOM are normal.  Neck: Normal range of motion. Neck supple. No tracheal deviation present. No thyromegaly present.  Cardiovascular: Normal rate, regular rhythm and normal heart  sounds.  Pulmonary/Chest: Effort normal and breath sounds normal. No respiratory distress. She has no wheezes.  Abdominal: Soft. Bowel sounds are normal.  Neurological: She is alert and oriented to person, place, and time.  Skin: Skin is warm and dry. Capillary refill takes less than 2 seconds.  Psychiatric: She has a normal mood and affect. Her behavior is normal.  Vitals reviewed.     Assessment & Plan:  Exam findings, diagnosis etiology and medication use and indications reviewed with patient. Follow- Up and discharge instructions provided. No emergent/urgent issues found on exam.  Discussed with the patient that she will need to follow-up with her PCP as she may need further work-up for her complaints of chest pain.  Informed patient that without proper resources in our office, it is difficult to provide an accurate diagnosis.  I also recommended to the patient that if her pain worsens, she would need follow-up in the emergency department.  Additionally, the patient has had adverse reactions to some of the medications recommended for her, and will recommend her following up with her PCP.  The patient became tearful at this time, but did verbalize understanding. Patient education was provided. Patient verbalized understanding of information provided and agrees with plan of care (POC), all questions answered. The patient is advised to call or return to clinic if condition does not see an improvement in symptoms, or to seek the care of the closest emergency department if condition worsens with the above plan.   1. Upper respiratory tract infection, unspecified type  -  Continue prescription for amoxicillin until completed. -Follow-up with your PCP for further evaluation.  You may need a chest x-ray to explore why you are having chest pain and pain with coughing and deep breathing. -Follow-up in the emergency department for worsening chest pain, shortness of breath, or difficulty breathing.  2.  Painful cough  -Continue prescription for amoxicillin until completed. -Follow-up with your PCP for further evaluation.  You may need a chest x-ray to explore why you are having chest pain and pain with coughing and deep breathing. -Follow-up in the emergency department for worsening chest pain, shortness of breath, or difficulty breathing.

## 2018-01-25 ENCOUNTER — Other Ambulatory Visit: Payer: Self-pay

## 2018-01-25 ENCOUNTER — Ambulatory Visit
Admission: RE | Admit: 2018-01-25 | Discharge: 2018-01-25 | Disposition: A | Discharge status: Home / Self Care | Payer: 59 | Source: Ambulatory Visit | Attending: Family Medicine | Admitting: Family Medicine

## 2018-01-25 ENCOUNTER — Ambulatory Visit (INDEPENDENT_AMBULATORY_CARE_PROVIDER_SITE_OTHER): Payer: 59 | Admitting: Family Medicine

## 2018-01-25 ENCOUNTER — Encounter: Payer: Self-pay | Admitting: Family Medicine

## 2018-01-25 VITALS — BP 118/80 | HR 91 | Temp 98.1°F | Ht 62.0 in | Wt 153.4 lb

## 2018-01-25 DIAGNOSIS — R0781 Pleurodynia: Secondary | ICD-10-CM | POA: Diagnosis not present

## 2018-01-25 DIAGNOSIS — J040 Acute laryngitis: Secondary | ICD-10-CM

## 2018-01-25 DIAGNOSIS — J40 Bronchitis, not specified as acute or chronic: Secondary | ICD-10-CM | POA: Diagnosis not present

## 2018-01-25 DIAGNOSIS — R05 Cough: Secondary | ICD-10-CM | POA: Diagnosis not present

## 2018-01-25 LAB — CBC WITH DIFFERENTIAL/PLATELET
BASOS ABS: 0 10*3/uL (ref 0.0–0.1)
Basophils Relative: 0.3 % (ref 0.0–3.0)
Eosinophils Absolute: 0.2 10*3/uL (ref 0.0–0.7)
Eosinophils Relative: 1.6 % (ref 0.0–5.0)
HEMATOCRIT: 42.8 % (ref 36.0–46.0)
HEMOGLOBIN: 14.3 g/dL (ref 12.0–15.0)
LYMPHS PCT: 17.6 % (ref 12.0–46.0)
Lymphs Abs: 1.9 10*3/uL (ref 0.7–4.0)
MCHC: 33.5 g/dL (ref 30.0–36.0)
MCV: 92.5 fl (ref 78.0–100.0)
Monocytes Absolute: 0.7 10*3/uL (ref 0.1–1.0)
Monocytes Relative: 6.5 % (ref 3.0–12.0)
Neutro Abs: 8.1 10*3/uL — ABNORMAL HIGH (ref 1.4–7.7)
Neutrophils Relative %: 74 % (ref 43.0–77.0)
Platelets: 275 10*3/uL (ref 150.0–400.0)
RBC: 4.63 Mil/uL (ref 3.87–5.11)
RDW: 14.1 % (ref 11.5–15.5)
WBC: 11 10*3/uL — ABNORMAL HIGH (ref 4.0–10.5)

## 2018-01-25 LAB — TSH: TSH: 1.36 u[IU]/mL (ref 0.35–4.50)

## 2018-01-25 LAB — COMPREHENSIVE METABOLIC PANEL
ALK PHOS: 65 U/L (ref 39–117)
ALT: 10 U/L (ref 0–35)
AST: 9 U/L (ref 0–37)
Albumin: 3.8 g/dL (ref 3.5–5.2)
BILIRUBIN TOTAL: 0.3 mg/dL (ref 0.2–1.2)
BUN: 13 mg/dL (ref 6–23)
CALCIUM: 8.9 mg/dL (ref 8.4–10.5)
CO2: 32 mEq/L (ref 19–32)
Chloride: 105 mEq/L (ref 96–112)
Creatinine, Ser: 0.91 mg/dL (ref 0.40–1.20)
GFR: 75.38 mL/min (ref >60.00)
Glucose, Bld: 93 mg/dL (ref 70–99)
Potassium: 3.4 mEq/L — ABNORMAL LOW (ref 3.5–5.1)
Sodium: 146 mEq/L — ABNORMAL HIGH (ref 135–145)
TOTAL PROTEIN: 6.6 g/dL (ref 6.0–8.3)

## 2018-01-25 NOTE — Progress Notes (Signed)
Subjective  CC:  Chief Complaint  Patient presents with  . Cough    been on Antibiotics for 8 days and not any better, she has lost her voice    HPI: SUBJECTIVE:  Same day acute visit; PCP not available. New pt to me. Chart reviewed.   Michele Mcbride is a 32 y.o. female who is here today due to 1 week history of laryngitis, voice hoarseness, congestion, nasal drainage, cough and pleuritic chest pain.  She has been seen twice in the last week.  I reviewed those notes.  She was initially seen at [facility] and treated for bronchitis with prednisone, Singulair, amoxicillin and albuterol inhaler.  Yesterday she reports that she is no better and in fact worse.  She reports she feels "horrible".  She denies fevers.  She reports she cannot take a deep breath without having pain.  She denies productive cough.  She is quite distraught.  She denies calf pain, lower extremity edema, abdominal pain, myalgias.  No recent travel.  No history of asthma.  She does have a history of fibromyalgia and recurrent migraines.  She says she cannot take NSAIDs due to her migraines.  She declines pain medicines. She wants to feel better now. Assessment  1. Laryngitis   2. Bronchitis   3. Pleuritic chest pain      Plan  Discussion:  Currently, patient clinically looks well.  She is hoarse.  Her lung exam is normal.  Vital signs are normal.  Extensive counseling done however patient appears very frustrated.  Recommend blood work and chest x-ray.  Recommend NSAIDs or pain medicines to try to help her pleuritic chest pain but she declines both.  Her cough is improved.  No other medications are indicated at this time.  Recommend rest, hydration and time.  Follow up: Return in about 2 weeks (around 02/08/2018) for recheck with Dr. Birdie Riddle.   Orders Placed This Encounter  Procedures  . DG Chest 2 View  . Comprehensive metabolic panel  . CBC with Differential/Platelet  . TSH   No orders of the defined types were  placed in this encounter.     I reviewed the patients updated PMH, FH, and SocHx.  Social History: Patient  reports that she has never smoked. She has never used smokeless tobacco. She reports that she does not drink alcohol or use drugs.  Patient Active Problem List   Diagnosis Date Noted  . Chronic migraine without aura, with intractable migraine, so stated, with status migrainosus 08/14/2017  . Insomnia 10/20/2016  . Physical exam 04/29/2016  . 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 Acute pharyngitis 09/27/2013  . Anxiety and depression 09/19/2012  . Sinusitis 03/09/2012  . PALPITATIONS 01/27/2010  . Migraine 12/19/2007  . ALLERGIC RHINITIS 07/06/2007    Review of Systems: Cardiovascular: negative for chest pain Respiratory: negative for productive cough or hemoptysis Gastrointestinal: negative for abdominal pain Genitourinary: negative for dysuria or gross hematuria Current Meds  Medication Sig  . albuterol (PROVENTIL HFA;VENTOLIN HFA) 108 (90 Base) MCG/ACT inhaler Inhale into the lungs every 6 (six) hours as needed for wheezing or shortness of breath.  Marland Kitchen amoxicillin (AMOXIL) 875 MG tablet Take 1 tablet (875 mg total) by mouth 2 (two) times daily.  . benzonatate (TESSALON) 200 MG capsule Take 1 capsule (200 mg total) by mouth 3 (three) times daily as needed.  . desloratadine (CLARINEX) 5 MG tablet Take 5 mg by mouth daily.  Marland Kitchen  EPINEPHrine (AUVI-Q) 0.3 mg/0.3 mL IJ SOAJ injection Inject 0.3 mg into the muscle once.  Eduard Roux (AIMOVIG) 140 MG/ML SOAJ Inject 140 mg into the skin every 30 (thirty) days.  . Etonogestrel-Ethinyl Estradiol (NUVARING VA) Place vaginally.  . montelukast (SINGULAIR) 10 MG tablet Take 1 tablet (10 mg total) by mouth at bedtime.  Marland Kitchen QVAR REDIHALER 80 MCG/ACT inhaler   . spironolactone (ALDACTONE) 50 MG tablet Take 50 mg by mouth 2 (two) times daily.   . Topiramate ER (TROKENDI XR) 200 MG CP24  Take 200 mg by mouth at bedtime.  Marland Kitchen zolmitriptan (ZOMIG) 5 MG nasal solution Place 1 spray into the nose as needed for migraine.  . [DISCONTINUED] escitalopram (LEXAPRO) 20 MG tablet Take 1 tablet (20 mg total) by mouth daily.  . [DISCONTINUED] traMADol (ULTRAM) 50 MG tablet Take 2 tablets (100 mg total) by mouth every 6 (six) hours as needed.    Objective  Vitals: BP 118/80   Pulse 91   Temp 98.1 F (36.7 C)   Ht 5\' 2"  (1.575 m)   Wt 153 lb 6.4 oz (69.6 kg)   SpO2 99%   BMI 28.06 kg/m  General: no acute respiratory distress, patient is frustrated and crying, her voice is hoarse Psych:  Alert and oriented,  HEENT:  Normocephalic, atraumatic, supple neck, moist mucous membranes, mildly erythematous pharynx without exudate, mild lymphadenopathy, supple neck Cardiovascular:  RRR without murmur. no edema Respiratory:  Good breath sounds bilaterally, CTAB with normal respiratory effort without rales, wheezing or rhonchi Skin:  Warm, no rashes Neurologic:   Mental status is normal. normal gait  Commons side effects, risks, benefits, and alternatives for medications and treatment plan prescribed today were discussed, and the patient expressed understanding of the given instructions. Patient is instructed to call or message via MyChart if he/she has any questions or concerns regarding our treatment plan. No barriers to understanding were identified. We discussed Red Flag symptoms and signs in detail. Patient expressed understanding regarding what to do in case of urgent or emergency type symptoms.  Medication list was reconciled, printed and provided to the patient in AVS. Patient instructions and summary information was reviewed with the patient as documented in the AVS. This note was prepared with assistance of Dragon voice recognition software. Occasional wrong-word or sound-a-like substitutions may have occurred due to the inherent limitations of voice recognition software

## 2018-01-25 NOTE — Patient Instructions (Addendum)
Please return in 1-2 weeks with Dr. Birdie Riddle for recheck.  Please go to [facility] on [address] for your chest xray.  We will call you with the results today.  If you have any questions or concerns, please don't hesitate to send me a message via MyChart or call the office at [phone]. Thank you for visiting with [place] today! It's our pleasure caring for you.

## 2018-01-25 NOTE — Progress Notes (Signed)
Please call patient: I have reviewed his/her lab results. Everything is normal. I have sent a mychart message. Chest xray is normal as are labs.

## 2018-02-05 ENCOUNTER — Other Ambulatory Visit: Payer: Self-pay | Admitting: Family Medicine

## 2018-02-05 DIAGNOSIS — G43001 Migraine without aura, not intractable, with status migrainosus: Secondary | ICD-10-CM

## 2018-02-05 MED FILL — CYCLOBENZAPRINE HCL 10 MG T: 10 | 30 days supply | Qty: 90 | Fill #3

## 2018-02-05 MED FILL — AIMOVIG 140 MG/ML SOAJ: 140 | 30 days supply | Qty: 1 | Fill #4

## 2018-02-05 MED FILL — BUTALB-ACETAMIN-CAFF 50-325: 50-325-40 | 22 days supply | Qty: 45 | Fill #0

## 2018-02-05 MED FILL — SUMATRIPTAN SUCC 100 MG TAB: 100 | 30 days supply | Qty: 9 | Fill #3

## 2018-02-05 MED FILL — TROKENDI XR 200 MG CAPSULE: 200 | 30 days supply | Qty: 30 | Fill #3

## 2018-02-05 NOTE — Telephone Encounter (Signed)
Last OV 01/25/18 fioricet last filled 07/28/17 #45 with 2

## 2018-02-09 ENCOUNTER — Ambulatory Visit: Payer: 59 | Admitting: Family Medicine

## 2018-02-12 ENCOUNTER — Telehealth: Payer: Self-pay | Admitting: *Deleted

## 2018-02-12 NOTE — Telephone Encounter (Signed)
error 

## 2018-02-12 NOTE — Telephone Encounter (Signed)
FMLA forms completed, signed & sent to medical records for processing along with copy of last office note.

## 2018-02-13 ENCOUNTER — Telehealth: Payer: Self-pay | Admitting: Neurology

## 2018-02-13 DIAGNOSIS — Z0289 Encounter for other administrative examinations: Secondary | ICD-10-CM

## 2018-02-13 NOTE — Telephone Encounter (Signed)
Completed FMLA forms were faxed to [employer], confirmation received

## 2018-02-23 DIAGNOSIS — H1045 Other chronic allergic conjunctivitis: Secondary | ICD-10-CM | POA: Diagnosis not present

## 2018-02-23 DIAGNOSIS — J3089 Other allergic rhinitis: Secondary | ICD-10-CM | POA: Diagnosis not present

## 2018-02-23 DIAGNOSIS — R05 Cough: Secondary | ICD-10-CM | POA: Diagnosis not present

## 2018-02-23 DIAGNOSIS — J301 Allergic rhinitis due to pollen: Secondary | ICD-10-CM | POA: Diagnosis not present

## 2018-02-23 MED FILL — PROAIR RESPICLICK INHAL PWD: 108 (90 BAS | 30 days supply | Qty: 1 | Fill #0

## 2018-02-23 MED FILL — CEFDINIR 300 MG CAPSULE: 300 | 10 days supply | Qty: 20 | Fill #0

## 2018-03-02 ENCOUNTER — Ambulatory Visit: Payer: Self-pay | Admitting: *Deleted

## 2018-03-02 MED ORDER — OSELTAMIVIR PHOSPHATE 75 MG PO CAPS: 75.0000 mg | ORAL_CAPSULE | Freq: Every day | ORAL | 0 refills | Status: DC

## 2018-03-02 MED FILL — OSELTAMIVIR PHOSPHATE 75 MG: 75 | 10 days supply | Qty: 10 | Fill #0

## 2018-03-02 MED FILL — AIMOVIG 140 MG/ML SOAJ: 140 | 30 days supply | Qty: 1 | Fill #5

## 2018-03-02 NOTE — Telephone Encounter (Signed)
Prescription for Tamiflu sent.  Please inform patient.

## 2018-03-02 NOTE — Telephone Encounter (Signed)
Message from [place] sent at 03/02/2018 1:56 PM EST   Summary: tamiflu   Pt requesting tamiflu as a preventive measure bc both of her children tested positive for flu today.    Preferred pharmacy: [place], [place] - [address] [place] [address] Phone: [phone] Fax: [phone]  Pt: [phone]         Pt calling to request a prescription for Tamiflu to be sent to [facility]. Pt states that both of her children were diagnosed with the flu on today and she works as a Marine scientist in a medical facility. Pt states she does not currently have symptoms. Pt can be contacted at [phone]  Reason for Disposition . [1] Influenza EXPOSURE within last 48 hours (2 days) AND [2] exposed person is a Public house manager, Publishing copy, or first responder (EMS)  Answer Assessment - Initial Assessment Questions 1. TYPE of EXPOSURE: "How were you exposed?" (e.g., close contact, not a close contact)     Both of her children were diagnosed with the flu on today 2. DATE of EXPOSURE: "When did the exposure occur?" (e.g., hour, days, weeks)     Pt states that son had symptoms for 2 days before diagnosed with the flu today 3. PREGNANCY: "Is there any chance you are pregnant?" "When was your last menstrual period?"     No chance of pregnancy, does does not have a menstrual cycle 4. HIGH RISK for COMPLICATIONS: "Do you have any heart or lung problems? Do you have a weakened immune system?" (e.g., CHF, COPD, asthma, HIV positive, chemotherapy, renal failure, diabetes mellitus, sickle cell anemia)     No but just got over a upper respiratory and currently uses inhalers 5. SYMPTOMS: "Do you have any symptoms?" (e.g., cough, fever, sore throat, difficulty breathing).     No  Protocols used: INFLUENZA EXPOSURE-A-AH

## 2018-03-02 NOTE — Telephone Encounter (Signed)
Spoke with pt to inform.  

## 2018-03-28 ENCOUNTER — Encounter: Payer: Self-pay | Admitting: Family Medicine

## 2018-04-02 ENCOUNTER — Other Ambulatory Visit: Payer: Self-pay | Admitting: Neurology

## 2018-04-02 DIAGNOSIS — G43711 Chronic migraine without aura, intractable, with status migrainosus: Secondary | ICD-10-CM

## 2018-04-02 MED FILL — ETONOGESTREL-ETHINYL ESTRAD: 0.12-0.015 | 84 days supply | Qty: 3 | Fill #1

## 2018-04-02 MED FILL — AIMOVIG 140 MG/ML SOAJ: 140 | 30 days supply | Qty: 1 | Fill #6

## 2018-04-02 MED FILL — SUMATRIPTAN SUCC 100 MG TAB: 100 | 30 days supply | Qty: 9 | Fill #4

## 2018-04-02 MED FILL — BUTALB-ACETAMIN-CAFF 50-325: 50-325-40 | 22 days supply | Qty: 45 | Fill #1

## 2018-04-02 MED FILL — SPIRONOLACTONE 50 MG TABS: 50 | 60 days supply | Qty: 120 | Fill #2

## 2018-04-03 MED FILL — CYCLOBENZAPRINE HCL 10 MG T: 10 | 30 days supply | Qty: 90 | Fill #0

## 2018-04-24 ENCOUNTER — Emergency Department (HOSPITAL_COMMUNITY)
Admission: EM | Admit: 2018-04-24 | Discharge: 2018-04-24 | Disposition: A | Discharge status: Home / Self Care | Payer: 59 | Attending: Emergency Medicine | Admitting: Emergency Medicine

## 2018-04-24 ENCOUNTER — Other Ambulatory Visit: Payer: Self-pay

## 2018-04-24 ENCOUNTER — Encounter (HOSPITAL_COMMUNITY): Payer: Self-pay | Admitting: Emergency Medicine

## 2018-04-24 DIAGNOSIS — S161XXA Strain of muscle, fascia and tendon at neck level, initial encounter: Secondary | ICD-10-CM | POA: Insufficient documentation

## 2018-04-24 DIAGNOSIS — Z79899 Other long term (current) drug therapy: Secondary | ICD-10-CM | POA: Diagnosis not present

## 2018-04-24 DIAGNOSIS — Y998 Other external cause status: Secondary | ICD-10-CM | POA: Insufficient documentation

## 2018-04-24 DIAGNOSIS — Y92410 Unspecified street and highway as the place of occurrence of the external cause: Secondary | ICD-10-CM | POA: Diagnosis not present

## 2018-04-24 DIAGNOSIS — Y9389 Activity, other specified: Secondary | ICD-10-CM | POA: Insufficient documentation

## 2018-04-24 DIAGNOSIS — Z91040 Latex allergy status: Secondary | ICD-10-CM | POA: Insufficient documentation

## 2018-04-24 DIAGNOSIS — R000 Tachycardia, unspecified: Secondary | ICD-10-CM | POA: Diagnosis not present

## 2018-04-24 DIAGNOSIS — S060X0A Concussion without loss of consciousness, initial encounter: Secondary | ICD-10-CM | POA: Diagnosis not present

## 2018-04-24 DIAGNOSIS — R110 Nausea: Secondary | ICD-10-CM | POA: Insufficient documentation

## 2018-04-24 DIAGNOSIS — S0990XA Unspecified injury of head, initial encounter: Secondary | ICD-10-CM | POA: Diagnosis present

## 2018-04-24 DIAGNOSIS — S39012A Strain of muscle, fascia and tendon of lower back, initial encounter: Secondary | ICD-10-CM | POA: Diagnosis not present

## 2018-04-24 DIAGNOSIS — M5489 Other dorsalgia: Secondary | ICD-10-CM | POA: Diagnosis not present

## 2018-04-24 DIAGNOSIS — R52 Pain, unspecified: Secondary | ICD-10-CM | POA: Diagnosis not present

## 2018-04-24 MED ORDER — KETOROLAC TROMETHAMINE 60 MG/2ML IM SOLN
30.0000 mg | Freq: Once | INTRAMUSCULAR | Status: AC
  Administered 2018-04-24: 30 mg via INTRAMUSCULAR
  Filled 2018-04-24: qty 2
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
## 2018-04-24 NOTE — Discharge Instructions (Addendum)
If you develop severe headache, vomiting, neck pain, blurry vision, dizziness, weakness or numbness in the arms or legs, trouble speaking, bowel or bladder incontinence, or any other new/concerning symptoms then return to the ER for evaluation.  Otherwise, take ibuprofen and/or Tylenol and use heating pad for your sore/hurt areas.

## 2018-05-01 ENCOUNTER — Ambulatory Visit (INDEPENDENT_AMBULATORY_CARE_PROVIDER_SITE_OTHER): Payer: 59 | Admitting: Orthopaedic Surgery

## 2018-05-01 ENCOUNTER — Encounter (INDEPENDENT_AMBULATORY_CARE_PROVIDER_SITE_OTHER): Payer: Self-pay | Admitting: Orthopaedic Surgery

## 2018-05-01 ENCOUNTER — Ambulatory Visit (INDEPENDENT_AMBULATORY_CARE_PROVIDER_SITE_OTHER): Payer: Self-pay

## 2018-05-01 DIAGNOSIS — M542 Cervicalgia: Secondary | ICD-10-CM | POA: Diagnosis not present
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
## 2018-05-07 MED FILL — BUTALB-ACETAMIN-CAFF 50-325: 50-325-40 | 22 days supply | Qty: 45 | Fill #2

## 2018-05-07 MED FILL — AIMOVIG 140 MG/ML SOAJ: 140 | 30 days supply | Qty: 1 | Fill #7

## 2018-05-22 ENCOUNTER — Telehealth: Payer: Self-pay | Admitting: *Deleted

## 2018-05-22 NOTE — Telephone Encounter (Signed)
Aimovig 140 mg PA completed on cover my meds. KEY: [id number]. Awaiting [employer] determination.

## 2018-05-24 MED FILL — CYCLOBENZAPRINE HCL 10 MG T: 10 | 30 days supply | Qty: 90 | Fill #0

## 2018-05-24 MED FILL — SUMATRIPTAN SUCC 100 MG TAB: 100 | 30 days supply | Qty: 9 | Fill #5

## 2018-05-24 MED FILL — ONDANSETRON HCL 4 MG TABLET: 4 | 10 days supply | Qty: 30 | Fill #1

## 2018-05-24 MED FILL — TROKENDI XR 200 MG CAPSULE: 200 | 30 days supply | Qty: 30 | Fill #4

## 2018-05-28 NOTE — Telephone Encounter (Signed)
From Cover My Meds: The request has been approved. The authorization is effective for a maximum of 12 fills from 05/15/2018 to 05/14/2019, as long as the member is enrolled in their current health plan. The request was reviewed and approved by a licensed clinical pharmacist. This has been approved for 1 syringe per month. A written notification letter will follow with additional details.

## 2018-06-07 MED FILL — AIMOVIG 140 MG/ML SOAJ: 140 | 30 days supply | Qty: 1 | Fill #8

## 2018-06-20 MED FILL — FLUCONAZOLE 150 MG TABS: 150 | 1 days supply | Qty: 1 | Fill #0

## 2018-07-12 MED FILL — AIMOVIG 140 MG/ML SOAJ: 140 | 30 days supply | Qty: 1 | Fill #9

## 2018-07-12 MED FILL — TROKENDI XR 200 MG CAPSULE: 200 | 30 days supply | Qty: 30 | Fill #5

## 2018-07-12 MED FILL — ETONOGESTREL-ETHINYL ESTRAD: 0.12-0.015 | 84 days supply | Qty: 3 | Fill #2

## 2018-07-31 ENCOUNTER — Other Ambulatory Visit: Payer: Self-pay | Admitting: *Deleted

## 2018-07-31 ENCOUNTER — Other Ambulatory Visit: Payer: Self-pay | Admitting: Family Medicine

## 2018-07-31 DIAGNOSIS — G43001 Migraine without aura, not intractable, with status migrainosus: Secondary | ICD-10-CM

## 2018-07-31 MED FILL — FLUCONAZOLE 150 MG TABS: 150 | 1 days supply | Qty: 1 | Fill #1

## 2018-07-31 MED FILL — SUMATRIPTAN SUCC 100 MG TAB: 100 | 30 days supply | Qty: 9 | Fill #0

## 2018-07-31 NOTE — Telephone Encounter (Signed)
Last Filled: 02/05/18 #45, 2 Has not had an OV with PCP since 11/25/16 for an acute visit.

## 2018-08-01 ENCOUNTER — Telehealth: Payer: Self-pay | Admitting: *Deleted

## 2018-08-01 NOTE — Telephone Encounter (Signed)
I replied to her my chart message and advised her the work in dr has deferred to Dr Jaynee Eagles when she returns to the office on Monday.

## 2018-08-01 NOTE — Telephone Encounter (Signed)
Received my chart message intended for Dr Jaynee Eagles who is out of the office. Patient is asking for Fioricet prescription. It was denied by her PCP who is prescribing provider. The patient hasn't seen PCP since 2018. I replied, advised her that Dr Jaynee Eagles is out of office, will send request to work in. Also advised she should schedule FU with PCP to continue to receive refills as work in dr will most likely not agree to prescribe. Fioricet has ever been prescribed by Dr Jaynee Eagles.  Note sent to Dr Leta Baptist.

## 2018-08-03 ENCOUNTER — Encounter: Payer: Self-pay | Admitting: *Deleted

## 2018-08-09 ENCOUNTER — Other Ambulatory Visit: Payer: Self-pay | Admitting: Neurology

## 2018-08-09 MED ORDER — BUTALBITAL-APAP-CAFFEINE 50-325-40 MG PO TABS: 1.0000 | ORAL_TABLET | Freq: Four times a day (QID) | ORAL | 2 refills | Status: DC | PRN

## 2018-08-10 MED FILL — BUTALBITAL-APAP-CAFFEINE 50: 50-325-40 | 90 days supply | Qty: 30 | Fill #0

## 2018-08-22 ENCOUNTER — Encounter: Payer: Self-pay | Admitting: Family Medicine

## 2018-08-23 ENCOUNTER — Telehealth: Payer: Self-pay | Admitting: *Deleted

## 2018-08-23 NOTE — Telephone Encounter (Signed)
Patient called asking to be seen for a virtual appointment. I gave some available times and she stated that she can only do an appointment between 11:30-12:30 because she works and that is her lunch break.  I discussed with Dr. Birdie Riddle who advised she would need to give [place] a little broader times or she could see the PA as she has no available appointments.  I called patient back and she is unwilling to see anyone else, and she is unwilling to give me any other times that she could do an appointment.  The first 11:30 available is July 8th - she did not want that appointment because she said it was too far away.   I told patient that unfortunately I could not boot another patient out of an appointment, that if she would see if she could get another lunch break that I could fit her in at other times. She stated that she would just have to suffer because she could not make any adjustments to her schedule. 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  I told her that we did not want her to suffer, and that the best option for a sooner appointment would be to see the PA, and then schedule a follow-up with PCP. She stated that she was not comfortable with anyone else so she would not schedule.  I started to advise patient to really consider it, and also to check to see if there was any flexibility at work.  Before I could finish my sentence the call disconnected. I was not sure if patient hung up or if there was a bad connection.  I called patient back twice and it rang once each time and I was sent to VM.  I left a message advising patient that we truly care and we do not want her to suffer and that we could schedule first available with PCP or as early as tomorrow with PA.

## 2018-08-23 NOTE — Telephone Encounter (Signed)
Noted.  We did all we could to accommodate pt.  I'm sure she could ask her employer for a 10-15 minute break at work for a virtual visit (this would save her the driving time).  Since she lost her brother, this doesn't seem like too much to ask.  I am not able to start new medication w/o a video visit.

## 2018-08-29 DIAGNOSIS — Z0289 Encounter for other administrative examinations: Secondary | ICD-10-CM

## 2018-09-03 ENCOUNTER — Other Ambulatory Visit: Payer: Self-pay | Admitting: Neurology

## 2018-09-03 DIAGNOSIS — G43711 Chronic migraine without aura, intractable, with status migrainosus: Secondary | ICD-10-CM

## 2018-09-03 MED FILL — SUMATRIPTAN SUCC 100 MG TAB: 100 | 30 days supply | Qty: 9 | Fill #1

## 2018-09-03 MED FILL — BUTALBITAL-APAP-CAFFEINE 50: 50-325-40 | 90 days supply | Qty: 30 | Fill #0

## 2018-09-04 ENCOUNTER — Encounter: Payer: Self-pay | Admitting: *Deleted

## 2018-09-04 ENCOUNTER — Telehealth: Payer: Self-pay | Admitting: *Deleted

## 2018-09-04 MED FILL — AIMOVIG 140 MG/ML SOAJ: 140 | 30 days supply | Qty: 1 | Fill #0

## 2018-09-04 NOTE — Telephone Encounter (Signed)
Sent pt a mychart message with update.  

## 2018-09-04 NOTE — Telephone Encounter (Signed)
Pt call for update on her FMLA form. Please call [phone]

## 2018-09-04 NOTE — Telephone Encounter (Signed)
The form has been completed and is ready for Dr. Cathren Laine signature.

## 2018-09-04 NOTE — Telephone Encounter (Signed)
FMLA forms completed, signed, and sent to medical records for processing.  

## 2018-09-14 DIAGNOSIS — H1045 Other chronic allergic conjunctivitis: Secondary | ICD-10-CM | POA: Diagnosis not present

## 2018-09-14 DIAGNOSIS — J301 Allergic rhinitis due to pollen: Secondary | ICD-10-CM | POA: Diagnosis not present

## 2018-09-14 DIAGNOSIS — J3089 Other allergic rhinitis: Secondary | ICD-10-CM | POA: Diagnosis not present

## 2018-09-14 DIAGNOSIS — R05 Cough: Secondary | ICD-10-CM | POA: Diagnosis not present

## 2018-09-14 MED FILL — SYMBICORT 80-4.5 MCG INH: 80-4.5 | 30 days supply | Qty: 10 | Fill #0

## 2018-09-14 MED FILL — DESLORATADINE 5 MG TAB: 5 | 90 days supply | Qty: 90 | Fill #0

## 2018-10-03 ENCOUNTER — Other Ambulatory Visit: Payer: Self-pay | Admitting: Neurology

## 2018-10-03 MED FILL — METHYLPREDNISOLONE 4 MG TBP: 4 | 6 days supply | Qty: 21 | Fill #1

## 2018-10-03 MED FILL — ONDANSETRON HCL 4 MG TABLET: 4 | 10 days supply | Qty: 30 | Fill #2

## 2018-10-03 MED FILL — AIMOVIG 140 MG/ML SOAJ: 140 | 30 days supply | Qty: 1 | Fill #1

## 2018-10-03 MED FILL — CYCLOBENZAPRINE HCL 10 MG T: 10 | 30 days supply | Qty: 90 | Fill #1

## 2018-10-03 MED FILL — TROKENDI XR 200 MG CAPSULE: 200 | 30 days supply | Qty: 30 | Fill #0

## 2018-10-03 MED FILL — SUMATRIPTAN SUCC 100 MG TAB: 100 | 30 days supply | Qty: 9 | Fill #2

## 2018-10-08 ENCOUNTER — Encounter: Payer: Self-pay | Admitting: Neurology

## 2018-10-08 ENCOUNTER — Other Ambulatory Visit: Payer: Self-pay

## 2018-10-08 ENCOUNTER — Telehealth: Payer: Self-pay | Admitting: *Deleted

## 2018-10-08 ENCOUNTER — Ambulatory Visit: Payer: 59 | Admitting: Neurology

## 2018-10-08 VITALS — BP 115/87 | HR 110 | Temp 98.0°F | Ht 62.0 in | Wt 146.0 lb

## 2018-10-08 DIAGNOSIS — L680 Hirsutism: Secondary | ICD-10-CM | POA: Diagnosis not present

## 2018-10-08 DIAGNOSIS — F4329 Adjustment disorder with other symptoms: Secondary | ICD-10-CM

## 2018-10-08 DIAGNOSIS — Z803 Family history of malignant neoplasm of breast: Secondary | ICD-10-CM | POA: Diagnosis not present

## 2018-10-08 DIAGNOSIS — R87619 Unspecified abnormal cytological findings in specimens from cervix uteri: Secondary | ICD-10-CM | POA: Diagnosis not present

## 2018-10-08 DIAGNOSIS — N760 Acute vaginitis: Secondary | ICD-10-CM | POA: Diagnosis not present

## 2018-10-08 DIAGNOSIS — G43711 Chronic migraine without aura, intractable, with status migrainosus: Secondary | ICD-10-CM | POA: Diagnosis not present

## 2018-10-08 DIAGNOSIS — Z01419 Encounter for gynecological examination (general) (routine) without abnormal findings: Secondary | ICD-10-CM | POA: Diagnosis not present

## 2018-10-08 DIAGNOSIS — R87612 Low grade squamous intraepithelial lesion on cytologic smear of cervix (LGSIL): Secondary | ICD-10-CM | POA: Diagnosis not present

## 2018-10-08 DIAGNOSIS — Z8742 Personal history of other diseases of the female genital tract: Secondary | ICD-10-CM | POA: Diagnosis not present

## 2018-10-08 MED ORDER — TOSYMRA 10 MG/ACT NA SOLN: 1.0000 | NASAL | 6 refills | Status: DC | PRN

## 2018-10-08 MED ORDER — ESCITALOPRAM OXALATE 20 MG PO TABS: 20.0000 mg | ORAL_TABLET | Freq: Every day | ORAL | 4 refills | Status: DC

## 2018-10-08 MED ORDER — NURTEC 75 MG PO TBDP: 75.0000 mg | ORAL_TABLET | Freq: Every day | ORAL | 6 refills | Status: DC | PRN

## 2018-10-08 MED ORDER — NARATRIPTAN HCL 2.5 MG PO TABS: 2.5000 mg | ORAL_TABLET | ORAL | 11 refills | Status: DC | PRN

## 2018-10-08 MED FILL — ESCITALOPRAM 20 MG TABLET: 20 | 90 days supply | Qty: 90 | Fill #0

## 2018-10-08 MED FILL — NARATRIPTAN HCL 2.5 MG TAB: 2.5 | 17 days supply | Qty: 10 | Fill #0

## 2018-10-08 MED FILL — SPIRONOLACTONE 50 MG TABS: 50 | 60 days supply | Qty: 120 | Fill #0

## 2018-10-08 MED FILL — ETONOGESTREL-ETHINYL ESTRAD: 0.12-0.015 | 84 days supply | Qty: 3 | Fill #0

## 2018-10-08 NOTE — Patient Instructions (Addendum)
Start Lexapro Continue Topiramate, Aimovig Try for acute management: Tosymra (free copay card), Nurtex (free copay card) Amerge orally - don;t use amerge with tosymra.  Tosymra - QUICK in 5 minutes but only last a few hours Amerge - fast acting within an hour but lasts longer 6 hours is 1/2 life Nurtex: take as soon as possible and can use with a triptan  [employer]: Patient drug information Access [employer] Online here. Copyright [phone] [employer] All rights reserved. (For additional information see "[employer]: Drug information") Brand Names: [place]  Nurtec  What is this drug used for?   It is used to treat migraine headaches.  What do I need to tell my doctor BEFORE I take this drug?   If you are allergic to this drug; any part of this drug; or any other drugs, foods, or substances. Tell your doctor about the allergy and what signs you had.   If you have any of these health problems: Kidney disease or liver disease.   If you take any drugs (prescription or OTC, natural products, vitamins) that must not be taken with this drug, like certain drugs that are used for HIV, infections, or seizures. There are many drugs that must not be taken with this drug.   This is not a list of all drugs or health problems that interact with this drug.   Tell your doctor and pharmacist about all of your drugs (prescription or OTC, natural products, vitamins) and health problems. You must check to make sure that it is safe for you to take this drug with all of your drugs and health problems. Do not start, stop, or change the dose of any drug without checking with your doctor.  What are some things I need to know or do while I take this drug?   Tell all of your health care providers that you take this drug. This includes your doctors, nurses, pharmacists, and dentists.   This drug is not meant to prevent or lower the number of migraine headaches you get.   Tell your doctor if you are pregnant, plan on  getting pregnant, or are breast-feeding. You will need to talk about the benefits and risks to you and the baby.  What are some side effects that I need to call my doctor about right away?   WARNING/CAUTION: Even though it may be rare, some people may have very bad and sometimes deadly side effects when taking a drug. Tell your doctor or get medical help right away if you have any of the following signs or symptoms that may be related to a very bad side effect:   Signs of an allergic reaction, like rash; hives; itching; red, swollen, blistered, or peeling skin with or without fever; wheezing; tightness in the chest or throat; trouble breathing, swallowing, or talking; unusual hoarseness; or swelling of the mouth, face, lips, tongue, or throat.  What are some other side effects of this drug?   All drugs may cause side effects. However, many people have no side effects or only have minor side effects. Call your doctor or get medical help if any of these side effects or any other side effects bother you or do not go away:   Upset stomach.   These are not all of the side effects that may occur. If you have questions about side effects, call your doctor. Call your doctor for medical advice about side effects.   You may report side effects to your national health agency.  How is this drug best taken?   Use this drug as ordered by your doctor. Read all information given to you. Follow all instructions closely.   Do not push the tablet out of the foil when opening. Use dry hands to take it from the foil. Place on your tongue and let it dissolve. Water is not needed. Do not swallow it whole. Do not chew, break, or crush it.   If needed, you may place the tablet under the tongue.   Use right after opening.  What do I do if I miss a dose?   This drug is taken on an as needed basis. Do not take more often than told by the doctor.  How do I store and/or throw out this drug?   Store at room temperature in a  dry place. Do not store in a bathroom.   Store in foil pouch until ready for use.   Keep all drugs in a safe place. Keep all drugs out of the reach of children and pets.   Throw away unused or expired drugs. Do not flush down a toilet or pour down a drain unless you are told to do so. Check with your pharmacist if you have questions about the best way to throw out drugs. There may be drug take-back programs in your area.  General drug facts   If your symptoms or health problems do not get better or if they become worse, call your doctor.   Do not share your drugs with others and do not take anyone else's drugs.   Some drugs may have another patient information leaflet. If you have any questions about this drug, please talk with your doctor, nurse, pharmacist, or other health care provider.   If you think there has been an overdose, call your poison control center or get medical care right away. Be ready to tell or show what was taken, how much, and when it happened.    Naratriptan tablets What is this medicine? NARATRIPTAN (NAR a trip tan) is used to treat migraines with or without aura. An aura is a strange feeling or visual disturbance that warns you of an attack. It is not used to prevent migraines. This medicine may be used for other purposes; ask your health care provider or pharmacist if you have questions. COMMON BRAND NAME(S): Amerge What should I tell my health care provider before I take this medicine? They need to know if you have any of these conditions:  cigarette smoker  circulation problems in fingers and toes  diabetes  heart disease  high blood pressure  high cholesterol  history of irregular heartbeat  history of stroke  kidney disease  liver disease  stomach or intestine problems  an unusual or allergic reaction to naratriptan, other medicines, foods, dyes, or preservatives  pregnant or trying to get pregnant  breast-feeding How should I use this  medicine? Take this medicine by mouth with a glass of water. Follow the directions on the prescription label. Do not take it more often than directed. Talk to your pediatrician regarding the use of this medicine in children. Special care may be needed. Overdosage: If you think you have taken too much of this medicine contact a poison control center or emergency room at once. NOTE: This medicine is only for you. Do not share this medicine with others. What if I miss a dose? This does not apply. This medicine is not for regular use. What may interact with this medicine?  Do not take this medicine with any of the following medicines:  ergot alkaloids like dihydroergotamine, ergonovine, ergotamine, methylergonovine  certain medicines for migraine headache like almotriptan, eletriptan, frovatriptan, naratriptan, rizatriptan, sumatriptan, zolmitriptan This medicine may also interact with the following medications:  certain medicines for depression, anxiety, or psychotic disorders This list may not describe all possible interactions. Give your health care provider a list of all the medicines, herbs, non-prescription drugs, or dietary supplements you use. Also tell them if you smoke, drink alcohol, or use illegal drugs. Some items may interact with your medicine. What should I watch for while using this medicine? Visit your healthcare professional for regular checks on your progress. Tell your healthcare professional if your symptoms do not start to get better or if they get worse. You may get drowsy or dizzy. Do not drive, use machinery, or do anything that needs mental alertness until you know how this medicine affects you. Do not stand up or sit up quickly, especially if you are an older patient. This reduces the risk of dizzy or fainting spells. Alcohol may interfere with the effect of this medicine. Tell your healthcare professional right away if you have any change in your eyesight. If you take  migraine medicines for 10 or more days a month, your migraines may get worse. Keep a diary of headache days and medicine use. Contact your healthcare professional if your migraine attacks occur more frequently. What side effects may I notice from receiving this medicine? Side effects that you should report to your doctor or health care professional as soon as possible:  allergic reactions like skin rash, itching or hives, swelling of the face, lips, or tongue  changes in vision  chest pain or chest tightness  signs and symptoms of a dangerous change in heartbeat or heart rhythm like chest pain; dizziness; fast, irregular heartbeat; palpitations; feeling faint or lightheaded; falls; breathing problems  signs and symptoms of a stroke like changes in vision; confusion; trouble speaking or understanding; severe headaches; sudden numbness or weakness of the face, arm or leg; trouble walking; dizziness; loss of balance or coordination  signs and symptoms of serotonin syndrome like irritable; confusion; diarrhea; fast or irregular heartbeat; muscle twitching; stiff muscles; trouble walking; sweating; high fever; seizures; chills; vomiting Side effects that usually do not require medical attention (report to your doctor or health care professional if they continue or are bothersome):  diarrhea  dizziness  drowsiness  headache  nausea, vomiting  pain, tingling, numbness in the hands or feet  stomach pain This list may not describe all possible side effects. Call your doctor for medical advice about side effects. You may report side effects to FDA at 1-800-FDA-1088. Where should I keep my medicine? Keep out of the reach of children. Store at room temperature between 20 and 25 degrees C (68 and 77 degrees F). Throw away any unused medicine after the expiration date. NOTE: This sheet is a summary. It may not cover all possible information. If you have questions about this medicine, talk to your  doctor, pharmacist, or health care provider.  2020 Elsevier/Gold Standard (2017-09-05 14:55:22)   Escitalopram tablets What is this medicine? ESCITALOPRAM (es sye TAL oh pram) is used to treat depression and certain types of anxiety. This medicine may be used for other purposes; ask your health care provider or pharmacist if you have questions. COMMON BRAND NAME(S): Lexapro What should I tell my health care provider before I take this medicine? They need to know if  you have any of these conditions:  bipolar disorder or a family history of bipolar disorder  diabetes  glaucoma  heart disease  kidney or liver disease  receiving electroconvulsive therapy  seizures (convulsions)  suicidal thoughts, plans, or attempt by you or a family member  an unusual or allergic reaction to escitalopram, the related drug citalopram, other medicines, foods, dyes, or preservatives  pregnant or trying to become pregnant  breast-feeding How should I use this medicine? Take this medicine by mouth with a glass of water. Follow the directions on the prescription label. You can take it with or without food. If it upsets your stomach, take it with food. Take your medicine at regular intervals. Do not take it more often than directed. Do not stop taking this medicine suddenly except upon the advice of your doctor. Stopping this medicine too quickly may cause serious side effects or your condition may worsen. A special MedGuide will be given to you by the pharmacist with each prescription and refill. Be sure to read this information carefully each time. Talk to your pediatrician regarding the use of this medicine in children. Special care may be needed. Overdosage: If you think you have taken too much of this medicine contact a poison control center or emergency room at once. NOTE: This medicine is only for you. Do not share this medicine with others. What if I miss a dose? If you miss a dose, take it as  soon as you can. If it is almost time for your next dose, take only that dose. Do not take double or extra doses. What may interact with this medicine? Do not take this medicine with any of the following medications:  certain medicines for fungal infections like fluconazole, itraconazole, ketoconazole, posaconazole, voriconazole  cisapride  citalopram  dronedarone  linezolid  MAOIs like Carbex, Eldepryl, Marplan, Nardil, and Parnate  methylene blue (injected into a vein)  pimozide  thioridazine This medicine may also interact with the following medications:  alcohol  amphetamines  aspirin and aspirin-like medicines  carbamazepine  certain medicines for depression, anxiety, or psychotic disturbances  certain medicines for migraine headache like almotriptan, eletriptan, frovatriptan, naratriptan, rizatriptan, sumatriptan, zolmitriptan  certain medicines for sleep  certain medicines that treat or prevent blood clots like warfarin, enoxaparin, dalteparin  cimetidine  diuretics  dofetilide  fentanyl  furazolidone  isoniazid  lithium  metoprolol  NSAIDs, medicines for pain and inflammation, like ibuprofen or naproxen  other medicines that prolong the QT interval (cause an abnormal heart rhythm)  procarbazine  rasagiline  supplements like St. John's wort, kava kava, valerian  tramadol  tryptophan  ziprasidone This list may not describe all possible interactions. Give your health care provider a list of all the medicines, herbs, non-prescription drugs, or dietary supplements you use. Also tell them if you smoke, drink alcohol, or use illegal drugs. Some items may interact with your medicine. What should I watch for while using this medicine? Tell your doctor if your symptoms do not get better or if they get worse. Visit your doctor or health care professional for regular checks on your progress. Because it may take several weeks to see the full effects  of this medicine, it is important to continue your treatment as prescribed by your doctor. Patients and their families should watch out for new or worsening thoughts of suicide or depression. Also watch out for sudden changes in feelings such as feeling anxious, agitated, panicky, irritable, hostile, aggressive, impulsive, severely restless, overly excited and  hyperactive, or not being able to sleep. If this happens, especially at the beginning of treatment or after a change in dose, call your health care professional. Dennis Bast may get drowsy or dizzy. Do not drive, use machinery, or do anything that needs mental alertness until you know how this medicine affects you. Do not stand or sit up quickly, especially if you are an older patient. This reduces the risk of dizzy or fainting spells. Alcohol may interfere with the effect of this medicine. Avoid alcoholic drinks. Your mouth may get dry. Chewing sugarless gum or sucking hard candy, and drinking plenty of water may help. Contact your doctor if the problem does not go away or is severe. What side effects may I notice from receiving this medicine? Side effects that you should report to your doctor or health care professional as soon as possible:  allergic reactions like skin rash, itching or hives, swelling of the face, lips, or tongue  anxious  black, tarry stools  changes in vision  confusion  elevated mood, decreased need for sleep, racing thoughts, impulsive behavior  eye pain  fast, irregular heartbeat  feeling faint or lightheaded, falls  feeling agitated, angry, or irritable  hallucination, loss of contact with reality  loss of balance or coordination  loss of memory  painful or prolonged erections  restlessness, pacing, inability to keep still  seizures  stiff muscles  suicidal thoughts or other mood changes  trouble sleeping  unusual bleeding or bruising  unusually weak or tired  vomiting Side effects that  usually do not require medical attention (report to your doctor or health care professional if they continue or are bothersome):  changes in appetite  change in sex drive or performance  headache  increased sweating  indigestion, nausea  tremors This list may not describe all possible side effects. Call your doctor for medical advice about side effects. You may report side effects to FDA at 1-800-FDA-1088. Where should I keep my medicine? Keep out of reach of children. Store at room temperature between 15 and 30 degrees C (59 and 86 degrees F). Throw away any unused medicine after the expiration date. NOTE: This sheet is a summary. It may not cover all possible information. If you have questions about this medicine, talk to your doctor, pharmacist, or health care provider.  2020 Elsevier/Gold Standard (2018-02-12 11:21:44)  Sumatriptan nasal spray What is this medicine? SUMATRIPTAN (soo ma TRIP tan) is used to treat migraines with or without aura. An aura is a strange feeling or visual disturbance that warns you of an attack. It is not used to prevent migraines. This medicine may be used for other purposes; ask your health care provider or pharmacist if you have questions. COMMON BRAND NAME(S): Imitrex, Tosymra What should I tell my health care provider before I take this medicine? They need to know if you have any of these conditions:  cigarette smoker  circulation problems in fingers and toes  diabetes  heart disease  high blood pressure  high cholesterol  history of irregular heartbeat  history of stroke  kidney disease  liver disease  stomach or intestine problems  an unusual or allergic reaction to sumatriptan, other medicines, foods, dyes, or preservatives  pregnant or trying to get pregnant  breast-feeding How should I use this medicine? This medicine is for use in the nose. Follow the directions on your product or prescription label. Do not use more  often than directed. Make sure that you are using your nasal  spray correctly. Ask your doctor or health care professional if you have any questions. Talk to your pediatrician regarding the use of this medicine in children. Special care may be needed. Overdosage: If you think you have taken too much of this medicine contact a poison control center or emergency room at once. NOTE: This medicine is only for you. Do not share this medicine with others. What if I miss a dose? This does not apply. This medicine is not for regular use. What may interact with this medicine? Do not take this medicine with any of the following medicines:  certain medicines for migraine headache like almotriptan, eletriptan, frovatriptan, naratriptan, rizatriptan, sumatriptan, zolmitriptan  ergot alkaloids like dihydroergotamine, ergonovine, ergotamine, methylergonovine  MAOIs like Carbex, Eldepryl, Marplan, Nardil, and Parnate This medicine may also interact with the following medications:  certain medicines for depression, anxiety, or psychotic disorders This list may not describe all possible interactions. Give your health care provider a list of all the medicines, herbs, non-prescription drugs, or dietary supplements you use. Also tell them if you smoke, drink alcohol, or use illegal drugs. Some items may interact with your medicine. What should I watch for while using this medicine? Visit your healthcare professional for regular checks on your progress. Tell your healthcare professional if your symptoms do not start to get better or if they get worse. You may get drowsy or dizzy. Do not drive, use machinery, or do anything that needs mental alertness until you know how this medicine affects you. Do not stand up or sit up quickly, especially if you are an older patient. This reduces the risk of dizzy or fainting spells. Alcohol may interfere with the effect of this medicine. Tell your healthcare professional right away  if you have any change in your eyesight. If you take migraine medicines for 10 or more days a month, your migraines may get worse. Keep a diary of headache days and medicine use. Contact your healthcare professional if your migraine attacks occur more frequently. What side effects may I notice from receiving this medicine? Side effects that you should report to your doctor or health care professional as soon as possible:  allergic reactions like skin rash, itching or hives, swelling of the face, lips, or tongue  changes in vision  chest pain or chest tightness  signs and symptoms of a dangerous change in heartbeat or heart rhythm like chest pain; dizziness; fast, irregular heartbeat; palpitations; feeling faint or lightheaded; falls; breathing problems  signs and symptoms of a stroke like changes in vision; confusion; trouble speaking or understanding; severe headaches; sudden numbness or weakness of the face, arm or leg; trouble walking; dizziness; loss of balance or coordination  signs and symptoms of serotonin syndrome like irritable; confusion; diarrhea; fast or irregular heartbeat; muscle twitching; stiff muscles; trouble walking; sweating; high fever; seizures; chills; vomiting Side effects that usually do not require medical attention (report to your doctor or health care professional if they continue or are bothersome):  changes in taste  diarrhea  dizziness  drowsiness  dry mouth  headache  nausea, vomiting  pain, tingling, numbness in the hands or feet  stomach pain This list may not describe all possible side effects. Call your doctor for medical advice about side effects. You may report side effects to FDA at 1-800-FDA-1088. Where should I keep my medicine? Keep out of the reach of children. Store at room temperature between 2 and 30 degrees C (36 and 86 degrees F). Throw away any unused  medicine after the expiration date. NOTE: This sheet is a summary. It may not  cover all possible information. If you have questions about this medicine, talk to your doctor, pharmacist, or health care provider.  2020 Elsevier/Gold Standard (2017-09-05 15:08:07)

## 2018-10-08 NOTE — Telephone Encounter (Signed)
Started Tosymra 10 mg PA on CMM. Key: [id number]. Awaiting next steps. Pt has savings card to use if this is denied.

## 2018-10-08 NOTE — Progress Notes (Signed)
[facility]    Provider:  Dr Jaynee Eagles Referring Provider: Dr. Patrecia Pour Primary Care Physician:  Dr. Patrecia Pour  CC:  Migraines  Interval history 10/08/2018: This is a 33 year old patient who is here for follow-up of migraines.  She has had migraine since the age of 44.  Started medications at the age of 35 and been to multiple neurologists.  She has been doing very well on CGRP medications the last year.  She has been under stress in the past.  Aimovig and Trokendi have been very successful.  We did fill out her FMLA forms and we will repeat those for the next year. Lots of stress but different than last year. 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   Treid: axert, maxalt, sumatriptan, amerge worked the best, trig zomig po and nasal  Interval history 12/11/2017: She is under a lot of stress, her parents moved in with her. She supports 5 people. Her ex-husband lost his job and so decreased child support. She is on Aimovig. She does not want to have botox completed. Discussed strategies for stress management. She feels the Aimovig is improving her migraines for now. Will continue on the Trokendi.  HPI:  Michele Mcbride is a 33 y.o. female here as a referral from Dr. Birdie Riddle for migraines. Suffering since the age of 86. Started medications at the age of 26. She has seen previous neurologists. She wakes with migraines and worse bending over and with valsalva. Can take her out of work for 3 days. She has light sensitivity, sound sensitivity, nausea, vomiting, worse with movement, its unilateral either side may spread to the whole head, pounding throbbing, can be severe. 22 headache days a month, 5 severe migraine days, 5-7 moderately severe migraines, 10 mild migraines or dull headaches. Can worsen with bending over and valsalva. No aura, + vision changes, no weakness or numbness. No medication overuse. This frequency and severity has been  ongoing for a year and intractable. Mother, sister,2 aunts, cousin, grandmother have migraines. She has associated neck pain/ No other focal neurologic deficits, associated symptoms, inciting events or modifiable factors. She has examined food triggers, red wine trigers, garlic, sleeping well and getting enough sleep, exercises. No other focal neurologic deficits, associated symptoms, inciting events or modifiable factors.  Medications tried: Topamax currently, propranolol, fioricetl, exapro, zofran, imitrex, axert, relpax, has been on Topamax 200mg , reglan, robaxin, tizanidine, Lexapro  Reviewed notes, labs and imaging from outside physicians, which showed:  TSH normal  Dg cervical spine 2004: reviewed report:  [facility]. LUMBAR SPINE 4 VIEWS THE PATIENT HAS FIVE NON-RIB-BEARING LUMBAR VERTEBRAE.  THE ALIGNMENT IS ANATOMIC.  POSTERIOR ELEMENTS ARE INTACT.  SACROILIAC JOINTS ARE SYMMETRIC.  THE SACRUM IS NORMAL. IMPRESSION NEGATIVE FOR FRACTURE.  Review of Systems: Patient complains of symptoms per HPI as well as the following symptoms: headache. Pertinent negatives and positives per HPI. All others negative.   Social History   Socioeconomic History  . Marital status: Divorced    Spouse name: Not on file  . Number of children: 2  . Years of education: LPN  . Highest education level: Not on file  Occupational History  . Not on file  Social Needs  . Financial resource strain: Not on file  . Food insecurity    Worry: Not on file    Inability: Not on file  . Transportation needs    Medical: Not on file    Non-medical:  Not on file  Tobacco Use  . Smoking status: Never Smoker  . Smokeless tobacco: Never Used  Substance and Sexual Activity  . Alcohol use: No  . Drug use: No  . Sexual activity: Yes    Comment: nuvaring  Lifestyle  . Physical activity    Days per week: Not on file    Minutes per session: Not on file  . Stress: Not on file   Relationships  . Social Herbalist on phone: Not on file    Gets together: Not on file    Attends religious service: Not on file    Active member of club or organization: Not on file    Attends meetings of clubs or organizations: Not on file    Relationship status: Not on file  . Intimate partner violence    Fear of current or ex partner: Not on file    Emotionally abused: Not on file    Physically abused: Not on file    Forced sexual activity: Not on file  Other Topics Concern  . Not on file  Social History Narrative   Lives at home with her 2 children   Right handed   Caffeine: 2 cups daily at max    Family History  Problem Relation Age of Onset  . Hyperlipidemia Mother   . Neuropathy Mother   . Migraines Mother   . Migraines Other        sisters, aunts, and grandmother had migraines on mother's side  . Heart Problems Sister     Past Medical History:  Diagnosis Date  . Asthma   . Migraine   . PCOS (polycystic ovarian syndrome)   . Sinus infection     Past Surgical History:  Procedure Laterality Date  . WISDOM TOOTH EXTRACTION      Current Outpatient Medications  Medication Sig Dispense Refill  . albuterol (PROVENTIL HFA;VENTOLIN HFA) 108 (90 Base) MCG/ACT inhaler Inhale into the lungs every 6 (six) hours as needed for wheezing or shortness of breath.    . butalbital-acetaminophen-caffeine (FIORICET) 50-325-40 MG tablet Take 1 tablet by mouth every 6 (six) hours as needed for headache. 30 tablet 2  . cyclobenzaprine (FLEXERIL) 10 MG tablet TAKE 1 TABLET BY MOUTH 3 TIMES DAILY AS NEEDED FOR MUSCLE SPASMS. (Patient taking differently: Take 10 mg by mouth 3 (three) times daily as needed for muscle spasms. ) 90 tablet 3  . desloratadine (CLARINEX) 5 MG tablet Take 5 mg by mouth daily.    Eduard Roux (AIMOVIG) 140 MG/ML SOAJ Inject 140 mg into the skin every 30 (thirty) days. Please call to schedule a follow-up for on or around October 2020. 1 mL 4  .  Etonogestrel-Ethinyl Estradiol (NUVARING VA) Place vaginally.    . ondansetron (ZOFRAN) 4 MG tablet Take 1 tablet (4 mg total) by mouth every 8 (eight) hours as needed for nausea or vomiting. 30 tablet 11  . spironolactone (ALDACTONE) 50 MG tablet Take 50 mg by mouth 2 (two) times daily.   3  . SUMAtriptan (IMITREX) 100 MG tablet TAKE 1 TABLET BY MOUTH EVERY 2 HOURS AS NEEDED FOR MIGRAINE. (MAX DOSE 2 TABLETS/24 HOURS.) 10 tablet 6  . SYMBICORT 80-4.5 MCG/ACT inhaler Inhale 2 puffs into the lungs 2 (two) times daily.    Marland Kitchen TROKENDI XR 200 MG CP24 TAKE 1 CAPSULE BY MOUTH AT BEDTIME. 30 capsule 11  . amoxicillin (AMOXIL) 875 MG tablet Take 1 tablet (875 mg total) by mouth 2 (two)  times daily. (Patient not taking: Reported on 04/24/2018) 20 tablet 0  . BELSOMRA 10 MG TABS TAKE 1 TABLET BY MOUTH AT BEDTIME (Patient not taking: Reported on 04/24/2018) 30 tablet 3  . benzonatate (TESSALON) 200 MG capsule Take 1 capsule (200 mg total) by mouth 3 (three) times daily as needed. (Patient not taking: Reported on 04/24/2018) 20 capsule 0  . EPINEPHrine (AUVI-Q) 0.3 mg/0.3 mL IJ SOAJ injection Inject 0.3 mg into the muscle once.    . escitalopram (LEXAPRO) 20 MG tablet Take 1 tablet (20 mg total) by mouth daily. 90 tablet 4  . ibuprofen (ADVIL,MOTRIN) 200 MG tablet Take 600 mg by mouth every 6 (six) hours as needed for headache or mild pain.    . montelukast (SINGULAIR) 10 MG tablet Take 1 tablet (10 mg total) by mouth at bedtime. (Patient not taking: Reported on 04/24/2018) 30 tablet 0  . naratriptan (AMERGE) 2.5 MG tablet Take 1 tablet (2.5 mg total) by mouth as needed for migraine. may repeat after 2 hours. 10 tablet 11  . oseltamivir (TAMIFLU) 75 MG capsule Take 1 capsule (75 mg total) by mouth daily. (Patient not taking: Reported on 04/24/2018) 10 capsule 0  . QVAR REDIHALER 80 MCG/ACT inhaler Inhale 2 puffs into the lungs 2 (two) times daily.   5  . Rimegepant Sulfate (NURTEC) 75 MG TBDP Take 75 mg by mouth daily  as needed. For migraines. Take as close to onset of migraine as possible. One daily maximum. 10 tablet 6  . SUMAtriptan (TOSYMRA) 10 MG/ACT SOLN Place 1 spray into the nose every hour as needed. Max 3 sprays daily. 6 each 6   No current facility-administered medications for this visit.     Allergies as of 10/08/2018 - Review Complete 10/08/2018  Allergen Reaction Noted  . Strawberry extract Anaphylaxis 03/06/2014  . Latex Rash 04/04/2014    Vitals: BP 115/87 (BP Location: Left Arm, Patient Position: Sitting)   Pulse (!) 110   Temp 98 F (36.7 C) Comment: taken by front staff  Ht 5\' 2"  (1.575 m)   Wt 146 lb (66.2 kg)   BMI 26.70 kg/m  Last Weight:  Wt Readings from Last 1 Encounters:  10/08/18 146 lb (66.2 kg)   Last Height:   Ht Readings from Last 1 Encounters:  10/08/18 5\' 2"  (1.575 m)     Physical exam: Exam: Gen: NAD, conversant, well nourised, well groomed                     CV: RRR, no MRG. No Carotid Bruits. No peripheral edema, warm, nontender Eyes: Conjunctivae clear without exudates or hemorrhage  Neuro: Detailed Neurologic Exam  Speech:    Speech is normal; fluent and spontaneous with normal comprehension.  Cognition:    The patient is oriented to person, place, and time;     recent and remote memory intact;     language fluent;     normal attention, concentration,     fund of knowledge Cranial Nerves:    The pupils are equal, round, and reactive to light. The fundi are normal and spontaneous venous pulsations are present. Visual fields are full to finger confrontation. Extraocular movements are intact. Trigeminal sensation is intact and the muscles of mastication are normal. The face is symmetric. The palate elevates in the midline. Hearing intact. Voice is normal. Shoulder shrug is normal. The tongue has normal motion without fasciculations.   Coordination:    Normal finger to nose and heel to  shin. Normal rapid alternating movements.   Gait:     Heel-toe and tandem gait are normal.   Motor Observation:    No asymmetry, no atrophy, and no involuntary movements noted. Tone:    Normal muscle tone.    Posture:    Posture is normal. normal erect    Strength:    Strength is V/V in the upper and lower limbs.      Sensation: intact to LT     Reflex Exam:  DTR's:    Deep tendon reflexes in the upper and lower extremities are normal bilaterally.   Toes:    The toes are downgoing bilaterally.   Clonus:    Clonus is absent.     Assessment/Plan:  33 year old with intractable migraines   Start Lexapro. Happy to start Wellbutrin as well if needed. Continue Topiramate, Aimovig - doing well Try for acute management: Tosymra (free copay card), Nurtex (free copay card) Amerge orally - don;t use amerge with tosymra.  Tosymra - QUICK in 5 minutes but only lasts a few hours take for acute onset migraie Amerge - fast acting within an hour but lasts longer 6 hours is 1/2 life, take when don;t need immediate relief(take Tosymra for that) Nurtex: take as soon as possible and can combine with a triptan  Prior:  - dry needling for cervical myofascial pain ordered  - MRI brain w/wo contrast unremarkable  -Discussed botox she would like Aimovig initially for migraineprevention. She does not want to have Botox.    - Dry needling for cervical myofascial pain  - Continue Trokendi  - Continue flexeril for neck and muscle spasms  - Zomig did not help at all. She is back on Imitrex despite the side effects, it works.    Discussed: To prevent or relieve headaches, try the following: Cool Compress. Lie down and place a cool compress on your head.  Avoid headache triggers. If certain foods or odors seem to have triggered your migraines in the past, avoid them. A headache diary might help you identify triggers.  Include physical activity in your daily routine. Try a daily walk or other moderate aerobic exercise.  Manage stress. Find  healthy ways to cope with the stressors, such as delegating tasks on your to-do list.  Practice relaxation techniques. Try deep breathing, yoga, massage and visualization.  Eat regularly. Eating regularly scheduled meals and maintaining a healthy diet might help prevent headaches. Also, drink plenty of fluids.  Follow a regular sleep schedule. Sleep deprivation might contribute to headaches Consider biofeedback. With this mind-body technique, you learn to control certain bodily functions - such as muscle tension, heart rate and blood pressure - to prevent headaches or reduce headache pain.    Proceed to emergency room if you experience new or worsening symptoms or symptoms do not resolve, if you have new neurologic symptoms or if headache is severe, or for any concerning symptom.   Provided education and documentation from American headache Society toolbox including articles on: chronic migraine medication overuse headache, chronic migraines, prevention of migraines, behavioral and other nonpharmacologic treatments for headache.   A total of 25 minutes was spent face-to-face with this patient. Over half this time was spent on counseling patient on the  1. Chronic migraine without aura, with intractable migraine, so stated, with status migrainosus   2. Stress and adjustment reaction    diagnosis and different diagnostic and therapeutic options, counseling and coordination of care, risks ans benefits of management, compliance, or risk  factor reduction and education.     A total of 25  minutes was spent face-to-face with this patient. Over half this time was spent on counseling patient on the  1. Chronic migraine without aura, with intractable migraine, so stated, with status migrainosus   2. Stress and adjustment reaction    diagnosis and different diagnostic and therapeutic options, counseling and coordination of care, risks ans benefits of management, compliance, or risk factor reduction and  education.    Sarina Ill, MD  [facility] [address] [place],  [address]  Phone [phone] Fax [phone]

## 2018-10-09 MED FILL — NURTEC 75 MG TBDP: 75 | 30 days supply | Qty: 8 | Fill #0

## 2018-10-09 MED FILL — TOSYMRA 10 MG/ACT SOLN: 10 | 15 days supply | Qty: 6 | Fill #0

## 2018-10-09 NOTE — Telephone Encounter (Signed)
PA completed on CMM. Awaiting [employer] determination. tried axert, maxalt, sumatriptan, amerge worked the best, trig zomig po and nasal.

## 2018-10-12 MED FILL — METRONIDAZOLE 500 MG TABS: 500 | 7 days supply | Qty: 14 | Fill #0

## 2018-10-14 LAB — HM PAP SMEAR

## 2018-10-15 ENCOUNTER — Encounter: Payer: Self-pay | Admitting: *Deleted

## 2018-10-15 NOTE — Telephone Encounter (Signed)
Received Tosymra denial from [place]. PA reference number [id number]. Drug not covered. Available alternatives are sumatriptan 5 mg and 20 mg nasal spray, Nurtect ODT, and Ubrelvy tablets. If we should choose to appeal, fax to [phone].   Spoke with Dr. Jaynee Eagles. Will send pt a message to have her use the savings card.

## 2018-10-15 NOTE — Telephone Encounter (Signed)
mychart message sent to pt

## 2018-11-15 ENCOUNTER — Telehealth: Payer: Self-pay | Admitting: Licensed Clinical Social Worker

## 2018-11-15 NOTE — Telephone Encounter (Signed)
A genetic counseling appt has been scheduled for  Ms. Stemmer on 9/21 at 1pm. She's been made aware to arrive 15 minutes early. Ms. Brignoni also declined a webex visit.

## 2018-11-16 MED FILL — AIMOVIG 140 MG/ML SOAJ: 140 | 30 days supply | Qty: 1 | Fill #2

## 2018-11-16 MED FILL — NARATRIPTAN HCL 2.5 MG TAB: 2.5 | 17 days supply | Qty: 10 | Fill #1

## 2018-11-16 MED FILL — NURTEC 75 MG TBDP: 75 | 30 days supply | Qty: 8 | Fill #1

## 2018-11-19 ENCOUNTER — Telehealth: Payer: Self-pay | Admitting: *Deleted

## 2018-11-19 NOTE — Telephone Encounter (Signed)
Completed Nurtec 75 mg PA on CMM. Key: [id number]. Awaiting [employer] determination. Pt has tried >2 triptans.

## 2018-11-23 ENCOUNTER — Telehealth: Payer: Self-pay | Admitting: Licensed Clinical Social Worker

## 2018-11-23 NOTE — Telephone Encounter (Signed)
Called patient regarding upcoming Webex appointment, left a voicemail. This will be considered a walk-in visit due to no communication to set this up as a virtual.

## 2018-11-26 ENCOUNTER — Inpatient Hospital Stay: Payer: 59

## 2018-11-26 ENCOUNTER — Encounter: Payer: Self-pay | Admitting: Licensed Clinical Social Worker

## 2018-11-26 ENCOUNTER — Inpatient Hospital Stay: Payer: 59 | Attending: Genetic Counselor | Admitting: Licensed Clinical Social Worker

## 2018-11-26 ENCOUNTER — Other Ambulatory Visit: Payer: Self-pay

## 2018-11-26 DIAGNOSIS — Z803 Family history of malignant neoplasm of breast: Secondary | ICD-10-CM

## 2018-11-26 NOTE — Progress Notes (Signed)
REFERRING PROVIDER: Lahoma Crocker, MD [place],  [place] [address]  PRIMARY PROVIDER:  Midge Minium, MD  PRIMARY REASON FOR VISIT:  1. Family history of breast cancer      HISTORY OF PRESENT ILLNESS:   Michele Mcbride, a 33 y.o. female, was seen for a [place] cancer genetics consultation at the request of Dr. Delsa Sale due to a family history of cancer.  Michele Mcbride presents to clinic today to discuss the possibility of a hereditary predisposition to cancer, genetic testing, and to further clarify her future cancer risks, as well as potential cancer risks for family members.   Michele Mcbride is a 33 y.o. female with no personal history of cancer.    CANCER HISTORY:  Oncology History   No history exists.     RISK FACTORS:  Menarche was at age 57.  First live birth at age 26.  OCP use for approximately many years.  Ovaries intact: yes.  Hysterectomy: no.  Menopausal status: premenopausal.  HRT use: 0 years. Colonoscopy: no; not examined. Mammogram within the last year: no. Number of breast biopsies: 0. Up to date with pelvic exams: yes. Any excessive radiation exposure in the past: no  Past Medical History:  Diagnosis Date  . Asthma   . Family history of breast cancer   . Migraine   . PCOS (polycystic ovarian syndrome)   . Sinus infection     Past Surgical History:  Procedure Laterality Date  . WISDOM TOOTH EXTRACTION      Social History   Socioeconomic History  . Marital status: Divorced    Spouse name: Not on file  . Number of children: 2  . Years of education: LPN  . Highest education level: Not on file  Occupational History  . Not on file  Social Needs  . Financial resource strain: Not on file  . Food insecurity    Worry: Not on file    Inability: Not on file  . Transportation needs    Medical: Not on file    Non-medical: Not on file  Tobacco Use  . Smoking status: Never Smoker  . Smokeless tobacco: Never Used   Substance and Sexual Activity  . Alcohol use: No  . Drug use: No  . Sexual activity: Yes    Comment: nuvaring  Lifestyle  . Physical activity    Days per week: Not on file    Minutes per session: Not on file  . Stress: Not on file  Relationships  . Social Herbalist on phone: Not on file    Gets together: Not on file    Attends religious service: Not on file    Active member of club or organization: Not on file    Attends meetings of clubs or organizations: Not on file    Relationship status: Not on file  Other Topics Concern  . Not on file  Social History Narrative   Lives at home with her 2 children   Right handed   Caffeine: 2 cups daily at max     FAMILY HISTORY:  We obtained a detailed, 4-generation family history.  Significant diagnoses are listed below: Family History  Problem Relation Age of Onset  . Hyperlipidemia Mother   . Neuropathy Mother   . Migraines Mother   . Breast cancer Mother 17  . Migraines Other        sisters, aunts, and grandmother had migraines on mother's side   Michele Mcbride has a  daughter, 59 and a son, 2, no history of cancer. She has 2 maternal half sisters, no cancers. She has 1 paternal half brother, 1 paternal half sister, no cancers.  Michele Mcbride mother was diagnosed with breast cancer at 41 and is living at 2. She has not had genetic testing. Patient has 3 maternal aunts, 3 maternal uncles, no cancers she knows of. No known cancers in maternal cousins, although she reports limited information for them. Her maternal grandmother died at 3, no cancers and grandfather died at 71 due to a heart attack.  Michele Mcbride has limited information about her paternal side. Her father is living in his late 52s. She is unsure exactly how many paternal aunts and uncles she has, but believes there are 3 aunts and 2 uncles. No info about cousins. No info about paternal grandparents.  Ms. Below is unaware of previous family history of genetic  testing for hereditary cancer risks. Patient's maternal ancestors are of unknown descent, and paternal ancestors are of unknown descent. There is no reported Ashkenazi Jewish ancestry. There is no known consanguinity.  GENETIC COUNSELING ASSESSMENT: [name] is a 33 y.o. female with a family history which is somewhat suggestive of a hereditary cancer syndrome and predisposition to cancer. We, therefore, discussed and recommended the following at today's visit.   DISCUSSION: We discussed that 5 - 10% of breast cancer is hereditary, with most cases associated with BRCA1/BRCA2 mutations.  There are other genes that can be associated with hereditary breast cancer syndromes.  These include PALB2, CHEK2, ATM.   We discussed that testing is beneficial for several reasons including knowing how to follow individuals for cancer screenings, and understand if other family members could be at risk for cancer and allow them to undergo genetic testing.   We reviewed the characteristics, features and inheritance patterns of hereditary cancer syndromes. We also discussed genetic testing, including the appropriate family members to test, the process of testing, insurance coverage and turn-around-time for results. We discussed the implications of a negative, positive and/or variant of uncertain significant result. We recommended Michele Mcbride pursue genetic testing for the CancerNext+RNAinsight gene panel.   The CancerNext gene panel offered by [employer] includes sequencing and rearrangement analysis for the following 34 genes:   APC, ATM, BARD1, BMPR1A, BRCA1, BRCA2, BRIP1, CDH1, CDK4, CDKN2A, CHEK2, DICER1, EPCAM, GREM1, HOXB13, MLH1, MRE11A, MSH2, MSH6, MUTYH, NBN, NF1, PALB2, PMS2, POLD1, POLE, PTEN, RAD50, RAD51C, RAD51D, SMAD4, SMARCA4, STK11, and TP53.   Based on [name]'s family history of cancer, she meets medical criteria for genetic testing. Despite that she meets criteria, she may still have an out of  pocket cost. We discussed that if her out of pocket cost for testing is over $100, the laboratory will call and confirm whether she wants to proceed with testing.  If the out of pocket cost of testing is less than $100 she will be billed by the genetic testing laboratory.   Based on the patient's family history, a statistical model [employer]) was used to estimate her risk of developing breast cancer. This estimates her lifetime risk of developing breast cancer to be approximately 17%. This estimation is in the setting of negative test results and may change based on results  The patient's lifetime breast cancer risk is a preliminary estimate based on available information using one of several models endorsed by the [place] (ACS). The ACS recommends consideration of breast MRI screening as an adjunct to mammography for patients at  high risk (defined as 20% or greater lifetime risk).       Ms. Cregg'sTyrer-Cuzick risk score is 17%.  This is above the general population risk of 13%, therefore, she is encouraged to continue to be mindful of her family history and be diligent with general population breast screening, including annual mammograms beginning 10 years prior to the youngest diagnosis in her family or by age 44.   She is encouraged to contact us regarding any changes to her personal or family history, as her recommendations for screening would be altered significantly if her lifetime risk is determined to be greater than 20% based on updated information.  She is encouraged to diligently follow standard screening protocols, and to be vigilant in maintaining regular contact with their local genetics clinic in anticipation of new discoveries regarding hereditary cancer genetic testing.    PLAN: After considering the risks, benefits, and limitations, Ms. Montfort provided informed consent to pursue genetic testing and the blood sample was sent to [facility] for analysis of the  CancerNext+RNAinisight panel. Results should be available within approximately 2-3 weeks' time, at which point they will be disclosed by telephone to Ms. Rupert, as will any additional recommendations warranted by these results. Ms. Kontos will receive a summary of her genetic counseling visit and a copy of her results once available. This information will also be available in Epic.   Based on Ms. Bertini's family history, we recommended her mother have genetic counseling and testing. Ms. Kohlman will let us know if we can be of any assistance in coordinating genetic counseling and/or testing for this family member. She believes her mother would be unlikely to want genetic testing since she does not have insurance and wouldn't be able to pay for the visit/lab work.   Lastly, we encouraged Ms. Frappier to remain in contact with cancer genetics annually so that we can continuously update the family history and inform her of any changes in cancer genetics and testing that may be of benefit for this family.   Ms. Hagin questions were answered to her satisfaction today. Our contact information was provided should additional questions or concerns arise. Thank you for the referral and allowing [place] to share in the care of your patient.   Faith Rogue, MS, [facility] Genetic Counselor [email or web address] Phone: [phone]  The patient was seen for a total of 30 minutes in face-to-face genetic counseling.  Drs. Magrinat, Lindi Adie and/or Burr Medico were available for discussion regarding this case.   _______________________________________________________________________ For Office Staff:  Number of people involved in session: 1 Was an Intern/ student involved with case: no

## 2018-11-26 NOTE — Telephone Encounter (Signed)
Received Nurtec approval notice from [place]. Approved from 11/16/2018 through 05/15/2019 quantity 16 tablets per 30 days. I faxed a copy to pt's pharmacy Received a receipt of confirmation.

## 2018-12-10 DIAGNOSIS — Z1379 Encounter for other screening for genetic and chromosomal anomalies: Secondary | ICD-10-CM | POA: Insufficient documentation

## 2018-12-11 ENCOUNTER — Telehealth: Payer: Self-pay | Admitting: Licensed Clinical Social Worker

## 2018-12-11 NOTE — Telephone Encounter (Signed)
Revealed positive genetic test result. 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 Michele Mcbride preferred to schedule an appointment with me in person to go over this result. She will see me 10/12 at 3 pm.

## 2018-12-17 ENCOUNTER — Inpatient Hospital Stay: Payer: 59 | Attending: Genetic Counselor | Admitting: Licensed Clinical Social Worker

## 2018-12-17 ENCOUNTER — Other Ambulatory Visit: Payer: Self-pay

## 2018-12-17 ENCOUNTER — Encounter: Payer: Self-pay | Admitting: Licensed Clinical Social Worker

## 2018-12-17 DIAGNOSIS — Z1502 Genetic susceptibility to malignant neoplasm of ovary: Secondary | ICD-10-CM

## 2018-12-17 DIAGNOSIS — Z803 Family history of malignant neoplasm of breast: Secondary | ICD-10-CM

## 2018-12-17 DIAGNOSIS — Z1589 Genetic susceptibility to other disease: Secondary | ICD-10-CM

## 2018-12-17 DIAGNOSIS — Z1501 Genetic susceptibility to malignant neoplasm of breast: Secondary | ICD-10-CM | POA: Insufficient documentation

## 2018-12-17 DIAGNOSIS — Z1509 Genetic susceptibility to other malignant neoplasm: Secondary | ICD-10-CM | POA: Diagnosis not present

## 2018-12-17 NOTE — Progress Notes (Signed)
Genetic Test Results  HPI:  Michele Mcbride was previously seen in the [facility] clinic due to a family history of breast cancer and concerns regarding a hereditary predisposition to cancer. Please refer to our prior cancer genetics clinic note for more information regarding our discussion, assessment and recommendations, at the time. Michele Mcbride recent genetic test results were disclosed to her, as were recommendations warranted by these results. These results and recommendations are discussed in more detail below.  CANCER HISTORY:  Oncology History   No history exists.    FAMILY HISTORY:  We obtained a detailed, 4-generation family history.  Significant diagnoses are listed below: Family History  Problem Relation Age of Onset   Hyperlipidemia Mother    Neuropathy Mother    Migraines Mother    Breast cancer Mother 47   Migraines Other        sisters, aunts, and grandmother had migraines on mother's side    Michele Mcbride has a daughter, 15 and a son, 1, no history of cancer. She has 2 maternal half sisters, no cancers. She has 1 paternal half brother, 1 paternal half sister, no cancers.  Michele Mcbride mother was diagnosed with breast cancer at 74 and is living at 6. She has not had genetic testing. Patient has 3 maternal aunts, 3 maternal uncles, no cancers she knows of. No known cancers in maternal cousins, although she reports limited information for them. Her maternal grandmother died at 72, no cancers and grandfather died at 31 due to a heart attack.  Michele Mcbride has limited information about her paternal side. Her father is living in his late 53s. She is unsure exactly how many paternal aunts and uncles she has, but believes there are 3 aunts and 2 uncles. No info about cousins. No info about paternal grandparents.  Michele Mcbride is unaware of previous family history of genetic testing for hereditary cancer risks. Patient's maternal ancestors are of unknown descent, and  paternal ancestors are of unknown descent. There is no reported Ashkenazi Jewish ancestry. There is no known consanguinity.  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   The remainder of the testing was normal. The CancerNext-Expanded + RNAinsight gene panel offered by [employer] and includes sequencing and rearrangement analysis for the following 36 genes: APC*, ATM*, AXIN2, BARD1, BMPR1A, BRCA1*, BRCA2*, BRIP1*, CDH1*, CDK4, CDKN2A, CHEK2*, DICER1, MLH1*, MSH2*, MSH3, MSH6*, MUTYH*, NBN, NF1*, NTHL1, PALB2*, PMS2*, PTEN*, RAD51C*, RAD51D*, RECQL, SMAD4, SMARCA4, STK11 and TP53* (sequencing and deletion/duplication); HOXB13, POLD1 and POLE (sequencing only); EPCAM and GREM1 (deletion/duplication only). DNA and RNA analyses performed for * genes.  The test report has been scanned into EPIC and is located under the Molecular Pathology section of the Results Review tab.  A portion of the result report is included below for reference.      CHEK2  Clinical condition  The CHEK2 gene is associated with an increased risk for autosomal dominant adult-onset cancers, including breast, colon, thyroid, prostate, and possibly others. The risks of these cancers, particularly breast, have been determined to be both variant- and family history-dependent.  Lifetime risks for female breast cancer related to frameshift variants, such as 1100delC, have been estimated to be 25-39% in heterozygotes.The risks for most missense variants (the type of variant found in Michele Mcbride) are unclear, but risks for certain variants (such as p.Ile157Thr) are thought to be lower.  Inheritance  Hereditary predisposition to cancer due to pathogenic variants in the CHEK2 gene has autosomal dominant inheritance. This means that an individual with a pathogenic variant has a 50%  chance of passing the condition onto their offspring. This result allows for the identification of at-risk relatives who can pursue testing for this specific familial variant. Many cases are inherited from a parent, but some cases can occur spontaneously (i.e., an individual with a pathogenic variant has parents who do not have it)  Management Current screening guidelines from the [employer]  (NCCN) for those with a pathogenic CHEK2 variant are as follows:  Breast cancer: -Annual mammogram with consideration of tomosynthesis; also consider breast MRI with contrast beginning at age 34 or 51 years younger than youngest breast cancer diagnosis -Evidence of risk-reducing mastectomy is insufficient; manage based on family history  Michele Mcbride's mother was diagnosed with breast cancer at 44, therefore she can begin breast screening now.   Colon cancer: -Colonoscopy screening every 5 years beginning at age 60 -If an individual has a first-degree relative with colorectal cancer, screening should begin 10 years prior to the relatives age at diagnosis if before 31. -If an individual has a personal history of colorectal cancer, screening recommendations should be based on recommendations for post-colorectal cancer resection.  It has been suggested that men with a CHEK2 pathogenic variant and a first-degree relative with prostate cancer have an annual prostate-specific antigen (PSA) analysis (PMID: 01601093). However, the benefits of screening for prostate cancer among men with a pathogenic variant in CHEK2 are uncertain (PMID: 23557322).  An individuals cancer risk and medical management are not determined by genetic test results alone. Overall cancer risk assessment incorporates additional factors, including personal medical history, family history, and any available genetic information that may result in a personalized plan for cancer prevention and surveillance.  Knowing  if a pathogenic CHEK2 variant is present is advantageous. At-risk relatives can be identified, enabling pursuit of a diagnostic evaluation. Information regarding hereditary cancer susceptibility genes is constantly evolving, and more clinically relevant data regarding CHEK2 is likely to become available in the near future. Awareness of this cancer predisposition encourages patients and their providers to inform at-risk family members, to diligently follow condition-specific screening protocols, and to be vigilant in maintaining close and regular contact with their local genetics clinic in anticipation of new information.  FAMILY MEMBERS:It is important that all of Michele Mcbride'srelatives (both men and women) know of the presence of this gene mutation. Genetic testing can sort out who in your family is at risk and who is not.   Michele Mcbride children are have a 50% chance to have inherited this mutation. However, they are relatively young and this will not be of any consequence to them for several years. We do not test children because there is no risk to them until they are adults. We recommend they have genetic counseling and testing by the time they are in their early 20s.    Michele Mcbride parents and siblings are also at 50% risk to have inherited the mutation found in her. It will be important to determine which side of the family this mutation is coming from so that more relatives on that side can be tested. It seems likely that the mutation is coming from Michele Mcbride's mother at this point, but we cannot be sure until she is tested. Michele Mcbride notes her mother does not have insurance and is worried about the cost of a counseling appointment. We  recommend MicheleMcbride's parents and siblings have genetic testing for the CHEK2 mutation, as identifying the presence of this mutation would allow them to also take advantage of risk-reducing measures.  PLAN:  1. These results will be made available to Ms.  Mcbride's referring provider, Lahoma Crocker. She would like to be referred to the high risk breast clinic here at the cancer center. She is anxious about this result and anxious to start screening. She says she is overwhelmed because she doesn't know if she is okay until she has screening.  2. Michele Mcbride will share this information with her family members. She is unsure if her mother would want to get testing since she doesn't have insurance. She knows that the testing itself would be free but that she would have to pay for the counseling appointment. Michele Mcbride knows she can give my number to her relatives and I can help coordinate testing. Her mother is Michele Mcbride, and her maternal half-sisters are Michele Mcbride and [employer]. Ms. Jett gives me permission to discuss her results with these family members.   SUPPORT AND RESOURCES: We provided information about two support groups for hereditary cancer syndrome information and support, Facing Our Risk (www.facingourrisk.com) and Bright Pink (www.brightpink.org) which some people have found useful. They provide opportunities to speak with other individuals from high-risk families.  Lastly,we encouragedMs. Yiu to remain in contact with cancer genetics on an annual basis so we can updateherpersonal and family histories and let her know of any advances.Our contact number was provided.MicheleMatthies's questions were answered tohersatisfaction, and sheknowssheis welcome to call us at anytime with additional questions or concerns.  Faith Rogue, MS Genetic Counselor [email or web address] [phone]

## 2018-12-20 MED FILL — NURTEC 75 MG TBDP: 75 | 30 days supply | Qty: 8 | Fill #2

## 2018-12-20 MED FILL — TROKENDI XR 200 MG CAPSULE: 200 | 30 days supply | Qty: 30 | Fill #1

## 2018-12-20 MED FILL — AIMOVIG 140 MG/ML SOAJ: 140 | 30 days supply | Qty: 1 | Fill #3

## 2018-12-20 MED FILL — SYMBICORT 80-4.5 MCG INH: 80-4.5 | 30 days supply | Qty: 10 | Fill #1

## 2018-12-20 MED FILL — NARATRIPTAN HCL 2.5 MG TAB: 2.5 | 17 days supply | Qty: 10 | Fill #2

## 2018-12-24 ENCOUNTER — Telehealth: Payer: Self-pay | Admitting: Oncology

## 2018-12-24 NOTE — Telephone Encounter (Signed)
Received a new referral for Michele Mcbride to be seen in the high risk breast clinic. She has been cld and schedule to see Dr. Jana Hakim on 11/9 at 4pm. She's been made aware to arrive 20 minutes early.

## 2018-12-27 DIAGNOSIS — H5203 Hypermetropia, bilateral: Secondary | ICD-10-CM | POA: Diagnosis not present

## 2019-01-12 NOTE — Progress Notes (Signed)
[facility]  [phone] [phone]     ID: Michele Mcbride DOB: 1985/07/23  MR#: [id number]  TGP#:[id number]  Patient Care Team: Midge Minium, MD as PCP - General (Family Medicine) Lahoma Crocker, MD as Consulting Physician (Obstetrics and Gynecology) Chauncey Cruel, MD OTHER MD:  CHIEF COMPLAINT: high risk for breast cancer  CURRENT TREATMENT: Intensified screening.  Considering tamoxifen   HISTORY OF CURRENT ILLNESS: Michele Mcbride tells me her mother was diagnosed with breast cancer at age 36. She learned about the genetics counseling option here and met with our counselor.  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   Her subsequent history is detailed below   INTERVAL HISTORY: Michele Mcbride was evaluated in the high risk breast cancer clinic on 01/14/2019 accompanied by her mother Michele Mcbride.   Michele Mcbride has never had a mammogram.  She is premenopausal, using NuvaRing for contraception.  She tells me aside from contraception this has resulted in significant decrease in her history of migraines.   REVIEW OF SYSTEMS: Michele Mcbride tells me during pregnancy she was told she had a heart murmur but this was not considered significant.  Her migraines are currently were controlled as noted above.  She has a history of asthma followed by Dr. Fredderick Phenix.  She denies recent headaches, visual changes, nausea, vomiting, stiff neck, dizziness, or gait imbalance. There has been no cough, phlegm production, or wheezing, no chest pain or pressure, and no change in bowel or bladder habits. The patient denies fever, rash, bleeding, unexplained fatigue or unexplained weight loss. A detailed review of systems was otherwise entirely negative.   PAST MEDICAL HISTORY: Past Medical History:  Diagnosis Date  . Asthma   . Family history of breast cancer   . Migraine   . Monoallelic mutation of CHEK2 gene in female patient    . PCOS (polycystic ovarian syndrome)   . Sinus infection     PAST SURGICAL HISTORY: Past Surgical History:  Procedure Laterality Date  . WISDOM TOOTH EXTRACTION      FAMILY HISTORY: Family History  Problem Relation Age of Onset  . Hyperlipidemia Mother   . Neuropathy Mother   . Migraines Mother   . Breast cancer Mother 49  . Migraines Other        sisters, aunts, and grandmother had migraines on mother's side   Patient has limited info about her father and that side of the family.  Her father is in his late 94s as of November 2020.  Patient's mother is 4 years old as of 11/2018. She underwent a breast lumpectomy at age 57.  She tells me she had no radiation, no antiestrogens, and no chemotherapy.  3 maternal aunts and 3 maternal uncles have no history of cancer to the patient's knowledge.  She does not have complete information on them however.  Maternal grandmother died at 27 and grandfather at 83, neither from cancer causes.  The patient denies a family hx of ovarian cancer. She has 2 half-siblings. Brother passed away in 09-22-18.   GYNECOLOGIC HISTORY:  No LMP recorded. (Menstrual status: Other). Menarche: 33 years old Age at first live birth: 33 years old [place] P 2 Contraceptive: Nuva ring HRT n/a  Hysterectomy? no BSO? 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 Joanmarie graduated from [employer] 's nursing program. She is divorced. She lives at home with daughter Michele Mcbride, 9 years  old, and son Michele Mcbride 40 years old. The patient's (ex)mother in law cares for them while the patient works.  The patient is not a church attender    ADVANCED DIRECTIVES: Not in place.  At the 01/14/2019 visit the patient was given the appropriate documents to complete a notarize at her discretion.   HEALTH MAINTENANCE: Social History   Tobacco Use  . Smoking status: Never Smoker  . Smokeless tobacco: Never Used  Substance Use Topics  .  Alcohol use: No  . Drug use: No     Colonoscopy: n/a  PAP: 10/2018, negative, suspected false-negative  Bone density: n/a   Allergies  Allergen Reactions  . Strawberry Extract Anaphylaxis  . Latex Rash    Rash and sores    Current Outpatient Medications  Medication Sig Dispense Refill  . cyclobenzaprine (FLEXERIL) 10 MG tablet TAKE 1 TABLET BY MOUTH 3 TIMES DAILY AS NEEDED FOR MUSCLE SPASMS. (Patient taking differently: Take 10 mg by mouth 3 (three) times daily as needed for muscle spasms. ) 90 tablet 3  . desloratadine (CLARINEX) 5 MG tablet Take 5 mg by mouth daily.    Michele Mcbride (AIMOVIG) 140 MG/ML SOAJ Inject 140 mg into the skin every 30 (thirty) days. Please call to schedule a follow-up for on or around October 2020. 1 mL 4  . escitalopram (LEXAPRO) 20 MG tablet Take 1 tablet (20 mg total) by mouth daily. 90 tablet 4  . Etonogestrel-Ethinyl Estradiol (NUVARING VA) Place vaginally.    . naratriptan (AMERGE) 2.5 MG tablet Take 1 tablet (2.5 mg total) by mouth as needed for migraine. may repeat after 2 hours. 10 tablet 11  . ondansetron (ZOFRAN) 4 MG tablet Take 1 tablet (4 mg total) by mouth every 8 (eight) hours as needed for nausea or vomiting. 30 tablet 11  . QVAR REDIHALER 80 MCG/ACT inhaler Inhale 2 puffs into the lungs 2 (two) times daily.   5  . Rimegepant Sulfate (NURTEC) 75 MG TBDP Take 75 mg by mouth daily as needed. For migraines. Take as close to onset of migraine as possible. One daily maximum. 10 tablet 6  . spironolactone (ALDACTONE) 50 MG tablet Take 50 mg by mouth 2 (two) times daily.   3  . SUMAtriptan (TOSYMRA) 10 MG/ACT SOLN Place 1 spray into the nose every hour as needed. Max 3 sprays daily. 6 each 6  . SYMBICORT 80-4.5 MCG/ACT inhaler Inhale 2 puffs into the lungs 2 (two) times daily.    Marland Kitchen TROKENDI XR 200 MG CP24 TAKE 1 CAPSULE BY MOUTH AT BEDTIME. 30 capsule 11  . albuterol (PROVENTIL HFA;VENTOLIN HFA) 108 (90 Base) MCG/ACT inhaler Inhale into the lungs  every 6 (six) hours as needed for wheezing or shortness of breath.    . butalbital-acetaminophen-caffeine (FIORICET) 50-325-40 MG tablet Take 1 tablet by mouth every 6 (six) hours as needed for headache. (Patient not taking: Reported on 01/14/2019) 30 tablet 2  . EPINEPHrine (AUVI-Q) 0.3 mg/0.3 mL IJ SOAJ injection Inject 0.3 mg into the muscle once.    Marland Kitchen ibuprofen (ADVIL,MOTRIN) 200 MG tablet Take 600 mg by mouth every 6 (six) hours as needed for headache or mild pain.     No current facility-administered medications for this visit.     OBJECTIVE: Young white woman in no acute distress  Vitals:   01/14/19 1601  BP: 119/82  Pulse: 70  Resp: 18  Temp: 98.7 F (37.1 C)  SpO2: 100%     Body mass index is 27.69 kg/m.  Wt Readings from Last 3 Encounters:  01/14/19 151 lb 6.4 oz (68.7 kg)  10/08/18 146 lb (66.2 kg)  01/25/18 153 lb 6.4 oz (69.6 kg)      ECOG FS:0 - Asymptomatic  Ocular: Sclerae unicteric, pupils round and equal Ear-nose-throat: Wearing a mask Lymphatic: No cervical or supraclavicular adenopathy Lungs no rales or rhonchi Heart regular rate and rhythm Abd soft, nontender, positive bowel sounds MSK no focal spinal tenderness, no joint edema Neuro: non-focal, well-oriented, appropriate affect Breasts: No masses of concern palpated in either breast.  No skin or nipple changes noted.  Both axillae are benign   LAB RESULTS:  CMP     Component Value Date/Time   NA 146 (H) 01/25/2018 0909   K 3.4 (L) 01/25/2018 0909   CL 105 01/25/2018 0909   CO2 32 01/25/2018 0909   GLUCOSE 93 01/25/2018 0909   BUN 13 01/25/2018 0909   CREATININE 0.91 01/25/2018 0909   CREATININE 1.05 04/29/2016 1547   CALCIUM 8.9 01/25/2018 0909   PROT 6.6 01/25/2018 0909   ALBUMIN 3.8 01/25/2018 0909   AST 9 01/25/2018 0909   ALT 10 01/25/2018 0909   ALKPHOS 65 01/25/2018 0909   BILITOT 0.3 01/25/2018 0909   GFRNONAA >60 03/28/2016 1520   GFRAA >60 03/28/2016 1520    No results  found for: TOTALPROTELP, ALBUMINELP, A1GS, A2GS, BETS, BETA2SER, GAMS, MSPIKE, SPEI  No results found for: KPAFRELGTCHN, LAMBDASER, KAPLAMBRATIO  Lab Results  Component Value Date   WBC 11.0 (H) 01/25/2018   NEUTROABS 8.1 (H) 01/25/2018   HGB 14.3 01/25/2018   HCT 42.8 01/25/2018   MCV 92.5 01/25/2018   PLT 275.0 01/25/2018    _0 @  No results found for: LABCA2  No components found for: CBJSEG315  No results for input(s): INR in the last 168 hours.  No results found for: LABCA2  No results found for: VVO160  No results found for: VPX106  No results found for: YIR485  No results found for: CA2729  No components found for: HGQUANT  No results found for: CEA1 / No results found for: CEA1   No results found for: AFPTUMOR  No results found for: CHROMOGRNA  No results found for: PSA1  No visits with results within 3 Day(s) from this visit.  Latest known visit with results is:  Office Visit on 01/25/2018  Component Date Value Ref Range Status  . Sodium 01/25/2018 146* 135 - 145 mEq/L Final  . Potassium 01/25/2018 3.4* 3.5 - 5.1 mEq/L Final  . Chloride 01/25/2018 105  96 - 112 mEq/L Final  . CO2 01/25/2018 32  19 - 32 mEq/L Final  . Glucose, Bld 01/25/2018 93  70 - 99 mg/dL Final  . BUN 01/25/2018 13  6 - 23 mg/dL Final  . Creatinine, Ser 01/25/2018 0.91  0.40 - 1.20 mg/dL Final  . Total Bilirubin 01/25/2018 0.3  0.2 - 1.2 mg/dL Final  . Alkaline Phosphatase 01/25/2018 65  39 - 117 U/L Final  . AST 01/25/2018 9  0 - 37 U/L Final  . ALT 01/25/2018 10  0 - 35 U/L Final  . Total Protein 01/25/2018 6.6  6.0 - 8.3 g/dL Final  . Albumin 01/25/2018 3.8  3.5 - 5.2 g/dL Final  . Calcium 01/25/2018 8.9  8.4 - 10.5 mg/dL Final  . GFR 01/25/2018 75.38  >60.00 mL/min Final  . WBC 01/25/2018 11.0* 4.0 - 10.5 K/uL Final  . RBC 01/25/2018 4.63  3.87 - 5.11 Mil/uL Final  . Hemoglobin  01/25/2018 14.3  12.0 - 15.0 g/dL Final  . HCT 01/25/2018 42.8  36.0 - 46.0 % Final   . MCV 01/25/2018 92.5  78.0 - 100.0 fl Final  . MCHC 01/25/2018 33.5  30.0 - 36.0 g/dL Final  . RDW 01/25/2018 14.1  11.5 - 15.5 % Final  . Platelets 01/25/2018 275.0  150.0 - 400.0 K/uL Final  . Neutrophils Relative % 01/25/2018 74.0  43.0 - 77.0 % Final  . Lymphocytes Relative 01/25/2018 17.6  12.0 - 46.0 % Final  . Monocytes Relative 01/25/2018 6.5  3.0 - 12.0 % Final  . Eosinophils Relative 01/25/2018 1.6  0.0 - 5.0 % Final  . Basophils Relative 01/25/2018 0.3  0.0 - 3.0 % Final  . Neutro Abs 01/25/2018 8.1* 1.4 - 7.7 K/uL Final  . Lymphs Abs 01/25/2018 1.9  0.7 - 4.0 K/uL Final  . Monocytes Absolute 01/25/2018 0.7  0.1 - 1.0 K/uL Final  . Eosinophils Absolute 01/25/2018 0.2  0.0 - 0.7 K/uL Final  . Basophils Absolute 01/25/2018 0.0  0.0 - 0.1 K/uL Final  . TSH 01/25/2018 1.36  0.35 - 4.50 uIU/mL Final    (this displays the last labs from the last 3 days)  No results found for: TOTALPROTELP, ALBUMINELP, A1GS, A2GS, BETS, BETA2SER, GAMS, MSPIKE, SPEI (this displays SPEP labs)  No results found for: KPAFRELGTCHN, LAMBDASER, KAPLAMBRATIO (kappa/lambda light chains)  No results found for: HGBA, HGBA2QUANT, HGBFQUANT, HGBSQUAN (Hemoglobinopathy evaluation)   Lab Results  Component Value Date   LDH 127 05/10/2010    No results found for: IRON, TIBC, IRONPCTSAT (Iron and TIBC)  No results found for: FERRITIN  Urinalysis    Component Value Date/Time   COLORURINE AMBER (A) 09/09/2012 0815   APPEARANCEUR CLOUDY (A) 09/09/2012 0815   LABSPEC 1.026 09/09/2012 0815   PHURINE 5.5 09/09/2012 0815   GLUCOSEU NEGATIVE 09/09/2012 0815   HGBUR LARGE (A) 09/09/2012 0815   HGBUR negative 05/04/2007 1526   BILIRUBINUR negative 12/07/2017 1216   KETONESUR 15 (A) 09/09/2012 0815   PROTEINUR Positive (A) 12/07/2017 1216   PROTEINUR 100 (A) 09/09/2012 0815   UROBILINOGEN 1.0 12/07/2017 1216   UROBILINOGEN 1.0 09/09/2012 0815   NITRITE negative 12/07/2017 1216   NITRITE NEGATIVE  09/09/2012 0815   LEUKOCYTESUR Negative 12/07/2017 1216     STUDIES: No results found.  ELIGIBLE FOR AVAILABLE RESEARCH PROTOCOL: no  ASSESSMENT: 33 y.o. [place], [place] woman with a deleterious CHEK2 mutation  (1) genetics testing 12/10/2018 showed a likely pathogenic variant in the CHEK2 gene, called c.1427C>T  (a) tests through the [facility]Next+RNAinsight gene panel offered by [employer] [employer] found no additional deleterious mutations in APC*, ATM*, AXIN2, BARD1, BMPR1A, BRCA1*, BRCA2*, BRIP1*, CDH1*, CDK4, CDKN2A, CHEK2*, DICER1, MLH1*, MSH2*, MSH3, MSH6*, MUTYH*, NBN, NF1*, NTHL1, PALB2*, PMS2*, PTEN*, RAD51C*, RAD51D*, RECQL, SMAD4, SMARCA4, STK11 and TP53* (sequencing and deletion/duplication); HOXB13, POLD1 and POLE (sequencing only); EPCAM and GREM1 (deletion/duplication only). DNA and RNA analyses performed for * genes.  PLAN: I spent approximately 60 minutes face to face with Michele Mcbride with more than 50% of that time spent in counseling and coordination of care. Specifically we reviewed the biology of the patient's diagnosis and the specifics of her situation.  We reviewed DNA is transmitted and copied into cells, the function of the CHEK2 gene, and how having a mutation in one of the true CHEK2 genes can lead to mistakes in DNA replication and that can lead to cancer.  The patient initiated genetics testing because of her mother's history.  However her mother did not have breast cancer.  What she had at age 31 was a fibroadenoma.  This was biopsied 12/16/2002 and excised 12/25/2002.  There was no malignancy ([id number], and [id number].  We are going to set the patient's mother up for genetics testing but I suspect the mutation is coming from the paternal side and unfortunately we have almost no information regarding the patient's father's siblings or second or third-degree relatives.  This is important because if there is a significant history of colon cancer for example on the father's  side of the family I would be concerned to start colonoscopies at an earlier age.  Today we discussed Kendall's risk of breast cancer, which clearly exceeds 20%.  She is a good candidate for intensified screening.  We discussed the pros and cons of this approach including concerns regarding false positives and cost.  She is interested in proceeding and I am setting her up for an MRI of the breast this month.  She will have mammography in May and we will continue to alternate these 2 tests at 54-monthintervals indefinitely.  We also discussed risk reduction strategies.  She is not interested and we did not recommend bilateral mastectomies.  We did discuss an optimal diet and exercise which slightly decreases total cancer risk.  But she is particularly interested in tamoxifen, which would cost her breast cancer risk in half.  The only concern is that she is on NuvaRing for contraception and while we could certainly switch to something different this has been very helpful to her in terms of migraines.  I would like to discuss other options with her gynecologist Dr. JDelsa Saleand see if we can find the progesterone only contraceptive that would work well with tamoxifen and also keep tenderness migraines under control.  At this point, then, the plan is to test her mother, get more information on the father's side of the family, start intensified screening, and explore the possibility of starting tamoxifen.  She was also given a copy of the appropriate documents to complete a notarized if she wishes to declare healthcare power of attorney, which I recommended she do.  AShaliehas a good understanding of the overall plan. She agrees with it. She knows the goal of treatment in her case is prevention. She will call with any problems that may develop before her next visit here.   GChauncey Cruel MD   01/14/2019 5:04 PM [facility]  [place] [address]Tel. [phone]   Fax. [phone]  This document serves as a record of services personally performed by GLurline Del MD. It was created on his behalf by KWilburn Mylar a trained medical scribe. The creation of this record is based on the scribe's personal observations and the provider's statements to them.   I, GLurline DelMD, have reviewed the above documentation for accuracy and completeness, and I agree with the above.

## 2019-01-14 ENCOUNTER — Other Ambulatory Visit: Payer: Self-pay | Admitting: *Deleted

## 2019-01-14 ENCOUNTER — Inpatient Hospital Stay: Payer: 59 | Attending: Genetic Counselor | Admitting: Oncology

## 2019-01-14 ENCOUNTER — Encounter: Payer: Self-pay | Admitting: Genetic Counselor

## 2019-01-14 ENCOUNTER — Other Ambulatory Visit: Payer: Self-pay

## 2019-01-14 ENCOUNTER — Encounter: Payer: Self-pay | Admitting: Oncology

## 2019-01-14 VITALS — BP 119/82 | HR 70 | Temp 98.7°F | Resp 18 | Ht 62.0 in | Wt 151.4 lb

## 2019-01-14 DIAGNOSIS — Z1501 Genetic susceptibility to malignant neoplasm of breast: Secondary | ICD-10-CM | POA: Insufficient documentation

## 2019-01-14 DIAGNOSIS — Z1239 Encounter for other screening for malignant neoplasm of breast: Secondary | ICD-10-CM | POA: Insufficient documentation

## 2019-01-14 DIAGNOSIS — Z9189 Other specified personal risk factors, not elsewhere classified: Secondary | ICD-10-CM | POA: Insufficient documentation

## 2019-01-14 DIAGNOSIS — Z1589 Genetic susceptibility to other disease: Secondary | ICD-10-CM

## 2019-01-15 ENCOUNTER — Telehealth: Payer: Self-pay | Admitting: Oncology

## 2019-01-15 NOTE — Telephone Encounter (Signed)
I left a message regarding schedule  

## 2019-01-16 ENCOUNTER — Emergency Department (HOSPITAL_COMMUNITY): Payer: 59

## 2019-01-16 ENCOUNTER — Emergency Department (HOSPITAL_COMMUNITY)
Admission: EM | Admit: 2019-01-16 | Discharge: 2019-01-17 | Disposition: A | Discharge status: Home / Self Care | Payer: 59 | Attending: Emergency Medicine | Admitting: Emergency Medicine

## 2019-01-16 ENCOUNTER — Other Ambulatory Visit: Payer: Self-pay

## 2019-01-16 ENCOUNTER — Encounter (HOSPITAL_COMMUNITY): Payer: Self-pay | Admitting: Emergency Medicine

## 2019-01-16 DIAGNOSIS — Z91040 Latex allergy status: Secondary | ICD-10-CM | POA: Insufficient documentation

## 2019-01-16 DIAGNOSIS — M25572 Pain in left ankle and joints of left foot: Secondary | ICD-10-CM | POA: Diagnosis not present

## 2019-01-16 DIAGNOSIS — S99912A Unspecified injury of left ankle, initial encounter: Secondary | ICD-10-CM | POA: Diagnosis not present

## 2019-01-16 DIAGNOSIS — Z79899 Other long term (current) drug therapy: Secondary | ICD-10-CM | POA: Insufficient documentation

## 2019-01-16 NOTE — ED Triage Notes (Signed)
Pt reports slipping on floor in her kitchen injuring her left foot. She is unable to bare-weight, obvious swelling no deformity.

## 2019-01-17 DIAGNOSIS — M25572 Pain in left ankle and joints of left foot: Secondary | ICD-10-CM | POA: Diagnosis not present

## 2019-01-17 DIAGNOSIS — Z91040 Latex allergy status: Secondary | ICD-10-CM | POA: Diagnosis not present

## 2019-01-17 DIAGNOSIS — Z79899 Other long term (current) drug therapy: Secondary | ICD-10-CM | POA: Diagnosis not present

## 2019-01-17 MED ORDER — IBUPROFEN 400 MG PO TABS
600.0000 mg | ORAL_TABLET | Freq: Once | ORAL | Status: AC
  Administered 2019-01-17: 600 mg via ORAL
  Filled 2019-01-17: qty 1

## 2019-01-17 NOTE — ED Provider Notes (Signed)
[facility] EMERGENCY DEPARTMENT Provider Note   CSN: [id number] Arrival date & time: 01/16/19  2110     History   Chief Complaint Chief Complaint  Patient presents with  . Ankle Pain    HPI Michele Mcbride is a 33 y.o. female with history of asthma, migraines, CHEK2 gene at high risk for breast cancer who presents with left ankle pain after fall.  Patient reports she slipped on her kitchen floor and sustained inversion injury to her left ankle.  She denies any numbness or tingling.  She is unable to bear weight.  No medications taken prior to arrival.  Patient did not hit her head or lose consciousness.  She denies any other injuries.     HPI  Past Medical History:  Diagnosis Date  . Asthma   . Family history of breast cancer   . Migraine   . Monoallelic mutation of CHEK2 gene in female patient   . PCOS (polycystic ovarian syndrome)   . Sinus infection     Patient Active Problem List   Diagnosis Date Noted  . Breast cancer screening, high risk patient 01/14/2019  . At high risk for breast cancer 01/14/2019  . Colon cancer high risk 01/14/2019  . Monoallelic mutation of CHEK2 gene in female patient   . Genetic testing 12/10/2018  . Family history of breast cancer   . Chronic migraine without aura, with intractable migraine, so stated, with status migrainosus 08/14/2017  . Insomnia 10/20/2016  . Physical exam 04/29/2016  . 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 Acute pharyngitis 09/27/2013  . Anxiety and depression 09/19/2012  . Sinusitis 03/09/2012  . PALPITATIONS 01/27/2010  . Migraine 12/19/2007  . ALLERGIC RHINITIS 07/06/2007    Past Surgical History:  Procedure Laterality Date  . WISDOM TOOTH EXTRACTION       OB History    Gravida  2   Para  2   Term  2   Preterm      AB      Living        SAB      TAB      Ectopic      Multiple      Live Births              Home Medications    Prior to Admission medications   Medication Sig Start Date End Date Taking? Authorizing Provider  albuterol (PROVENTIL HFA;VENTOLIN HFA) 108 (90 Base) MCG/ACT inhaler Inhale into the lungs every 6 (six) hours as needed for wheezing or shortness of breath.    [provider]  butalbital-acetaminophen-caffeine (FIORICET) 50-325-40 MG tablet Take 1 tablet by mouth every 6 (six) hours as needed for headache. Patient not taking: Reported on 01/14/2019 08/09/18   Melvenia Beam, MD  cyclobenzaprine (FLEXERIL) 10 MG tablet TAKE 1 TABLET BY MOUTH 3 TIMES DAILY AS NEEDED FOR MUSCLE SPASMS. Patient taking differently: Take 10 mg by mouth 3 (three) times daily as needed for muscle spasms.  04/03/18   Melvenia Beam, MD  desloratadine (CLARINEX) 5 MG tablet Take 5 mg by mouth daily.    [provider]  EPINEPHrine (AUVI-Q) 0.3 mg/0.3 mL IJ SOAJ injection Inject 0.3 mg into the muscle once.    [provider]  Erenumab-aooe (AIMOVIG) 140 MG/ML SOAJ Inject 140 mg into the skin every 30 (thirty) days. Please call to schedule a follow-up for on or around October 2020. 09/04/18  Melvenia Beam, MD  escitalopram (LEXAPRO) 20 MG tablet Take 1 tablet (20 mg total) by mouth daily. 10/08/18   Melvenia Beam, MD  Etonogestrel-Ethinyl Estradiol ([place]) Place vaginally.    [provider]  ibuprofen (ADVIL,MOTRIN) 200 MG tablet Take 600 mg by mouth every 6 (six) hours as needed for headache or mild pain.    [provider]  naratriptan (AMERGE) 2.5 MG tablet Take 1 tablet (2.5 mg total) by mouth as needed for migraine. may repeat after 2 hours. 10/08/18   Melvenia Beam, MD  ondansetron (ZOFRAN) 4 MG tablet Take 1 tablet (4 mg total) by mouth every 8 (eight) hours as needed for nausea or vomiting. 12/11/17   Melvenia Beam, MD  QVAR REDIHALER 80 MCG/ACT inhaler Inhale 2 puffs into the lungs 2 (two) times daily.  09/23/16   [provider]  Rimegepant Sulfate (NURTEC) 75 MG TBDP Take 75 mg by mouth daily as needed. For migraines. Take as close to onset of migraine as possible. One daily maximum. 10/08/18   Melvenia Beam, MD  spironolactone (ALDACTONE) 50 MG tablet Take 50 mg by mouth 2 (two) times daily.  10/17/16   [provider]  SUMAtriptan (TOSYMRA) 10 MG/ACT SOLN Place 1 spray into the nose every hour as needed. Max 3 sprays daily. 10/08/18   Melvenia Beam, MD  SYMBICORT 80-4.5 MCG/ACT inhaler Inhale 2 puffs into the lungs 2 (two) times daily. 09/14/18   [provider]  TROKENDI XR 200 MG CP24 TAKE 1 CAPSULE BY MOUTH AT BEDTIME. 10/03/18   Melvenia Beam, MD    Family History Family History  Problem Relation Age of Onset  . Hyperlipidemia Mother   . Neuropathy Mother   . Migraines Mother   . Breast cancer Mother 64  . Migraines Other        sisters, aunts, and grandmother had migraines on mother's side    Social History Social History   Tobacco Use  . Smoking status: Never Smoker  . Smokeless tobacco: Never Used  Substance Use Topics  . Alcohol use: No  . Drug use: No     Allergies   Strawberry extract and Latex   Review of Systems Review of Systems  Constitutional: Negative for fever.  Musculoskeletal: Positive for arthralgias and joint swelling.  Neurological: Negative for numbness.     Physical Exam Updated Vital Signs BP 116/84 (BP Location: Right Arm)   Pulse 86   Temp 98.6 F (37 C) (Oral)   Resp 19   SpO2 99%   Physical Exam Vitals signs and nursing note reviewed.  Constitutional:      General: She is not in acute distress.    Appearance: She is well-developed. She is not diaphoretic.  HENT:     Head: Normocephalic and atraumatic.     Mouth/Throat:     Pharynx: No oropharyngeal exudate.  Eyes:     General: No scleral icterus.       Right eye: No discharge.        Left eye: No discharge.     Conjunctiva/sclera: Conjunctivae normal.     Pupils: Pupils  are equal, round, and reactive to light.  Neck:     Musculoskeletal: Normal range of motion and neck supple.     Thyroid: No thyromegaly.  Cardiovascular:     Rate and Rhythm: Normal rate and regular rhythm.     Heart sounds: Normal heart sounds. No murmur. No friction rub.  No gallop.   Pulmonary:     Effort: Pulmonary effort is normal. No respiratory distress.     Breath sounds: Normal breath sounds. No stridor. No wheezing or rales.  Musculoskeletal:     Left ankle: She exhibits decreased range of motion and swelling. She exhibits no ecchymosis, no deformity and normal pulse. Tenderness. Lateral malleolus and head of 5th metatarsal tenderness found. No medial malleolus and no proximal fibula tenderness found.       Feet:  Lymphadenopathy:     Cervical: No cervical adenopathy.  Skin:    General: Skin is warm and dry.     Capillary Refill: Capillary refill takes less than 2 seconds.     Coloration: Skin is not pale.     Findings: No rash.  Neurological:     Mental Status: She is alert.     Coordination: Coordination normal.      ED Treatments / Results  Labs (all labs ordered are listed, but only abnormal results are displayed) Labs Reviewed - No data to display  EKG None  Radiology Dg Ankle Complete Left  Result Date: 01/16/2019 CLINICAL DATA:  Left ankle pain following fall, initial encounter EXAM: LEFT ANKLE COMPLETE - 3+ VIEW COMPARISON:  None. FINDINGS: There is no evidence of fracture, dislocation, or joint effusion. There is no evidence of arthropathy or other focal bone abnormality. Soft tissues are unremarkable. IMPRESSION: No acute abnormality noted. Electronically Signed   By: Inez Catalina M.D.   On: 01/16/2019 22:19    Procedures Procedures (including critical care time)  Medications Ordered in ED Medications  ibuprofen (ADVIL) tablet 600 mg (600 mg Oral Given 01/17/19 0052)     Initial Impression / Assessment and Plan / ED Course  I have reviewed the  triage vital signs and the nursing notes.  Pertinent labs & imaging results that were available during my care of the patient were reviewed by me and considered in my medical decision making (see chart for details).        Patient with suspected ankle sprain.  Left ankle x-ray is negative.  Also reviewed by me.  Foot is neurovascularly intact.  Supportive treatment discussed including ice, elevation, NSAIDs, Tylenol.  ASO splint and crutches given.  Follow-up to orthopedics for further evaluation.  Patient understands and agrees with plan.  Patient vitals stable throughout ED course and discharged in satisfactory condition.  Final Clinical Impressions(s) / ED Diagnoses   Final diagnoses:  Acute left ankle pain    ED Discharge Orders    None       Frederica Kuster, PA-C 01/17/19 0201    Mesner, Corene Cornea, MD 01/17/19 [phone]

## 2019-01-17 NOTE — Discharge Instructions (Addendum)
Your ankle x-ray was negative for fracture today, however there is always a missed fracture.  We will treat your ankle injury as a sprain.  Medications: Ibuprofen, Tylenol  Treatment: Take ibuprofen 600mg  every 6 hours to help with pain and swelling. You can alternate with Tylenol as prescribed over-the-counter for breakthrough pain 4 hours after taking ibuprofen. Use ice 3-4 times daily alternating 20 minutes on, 20 minutes off. Keep your leg elevated whenever you're not walking on it. Wear your splint at all times except when bathing. Use crutches at all times initially and begin partial weightbearing as tolerated.  Follow-up: Please follow-up with the orthopedic doctor if your symptoms are not improving over the next 7-10 days. Please return to the emergency department if you develop any new or worsening symptoms.

## 2019-01-21 ENCOUNTER — Encounter: Payer: Self-pay | Admitting: Oncology

## 2019-01-28 ENCOUNTER — Other Ambulatory Visit: Payer: Self-pay | Admitting: Oncology

## 2019-01-28 ENCOUNTER — Encounter: Payer: Self-pay | Admitting: Family Medicine

## 2019-01-28 MED ORDER — TAMOXIFEN CITRATE 20 MG PO TABS: 20.0000 mg | ORAL_TABLET | Freq: Every day | ORAL | 12 refills | Status: AC

## 2019-01-28 MED FILL — TAMOXIFEN 20 MG TABLET: 20 | 90 days supply | Qty: 90 | Fill #0

## 2019-01-28 NOTE — Progress Notes (Signed)
I called Satrina to discuss the hormone/tamoxifen issue.  Unfortunately she was just diagnosed with Covid she feels absolutely terrible.  She is having her children tested.  I am hoping within a week she will be feeling better.  She did discuss the NuvaRing issue with Dr. Delsa Sale and tells me she suggested either progesterone pills or the Mirena IUD.  Coralis is reluctant to use the Mirena because her prior attempts with it migrated.  She is expecting to get a prescription on the progesterone pill from Dr. Delsa Sale.  Levora understands if she does go on that she will have to take it daily at the same time every day.  Assuming that we do go with either of those options then I am comfortable with her starting tamoxifen.  I have gone ahead and placed the prescription in for her.  She will call us within a week or so first of all to tell us how she is doing as far as the Covid is concerned and second to make sure that she has all the appropriate medications on hand

## 2019-01-29 ENCOUNTER — Telehealth: Payer: Self-pay | Admitting: Neurology

## 2019-01-29 MED ORDER — PREDNISONE 10 MG PO TABS: ORAL_TABLET | ORAL | 0 refills | Status: DC

## 2019-01-29 MED FILL — NORETHINDRONE 0.35 MG TABS: 0.35 | 84 days supply | Qty: 84 | Fill #0

## 2019-01-29 MED FILL — predniSONE 10 MG TABS: 10 | 9 days supply | Qty: 18 | Fill #0

## 2019-01-29 NOTE — Telephone Encounter (Signed)
I called patient regarding collecting $50 form fee for [employer] FMLA paperwork. Patient states she will need to call back to make this payment. I advised patient that form will not be filled out until payment is collected. Patient verbalized understanding.

## 2019-02-01 ENCOUNTER — Other Ambulatory Visit: Payer: Self-pay

## 2019-02-01 ENCOUNTER — Encounter: Payer: Self-pay | Admitting: Emergency Medicine

## 2019-02-01 ENCOUNTER — Ambulatory Visit
Admission: EM | Admit: 2019-02-01 | Discharge: 2019-02-01 | Disposition: A | Discharge status: Home / Self Care | Payer: 59 | Attending: Emergency Medicine | Admitting: Emergency Medicine

## 2019-02-01 DIAGNOSIS — U071 COVID-19: Secondary | ICD-10-CM

## 2019-02-01 DIAGNOSIS — R110 Nausea: Secondary | ICD-10-CM

## 2019-02-01 DIAGNOSIS — R05 Cough: Secondary | ICD-10-CM

## 2019-02-01 DIAGNOSIS — R059 Cough, unspecified: Secondary | ICD-10-CM

## 2019-02-01 MED ORDER — AZITHROMYCIN 250 MG PO TABS: 250.0000 mg | ORAL_TABLET | Freq: Every day | ORAL | 0 refills | Status: DC

## 2019-02-01 MED ORDER — PROMETHAZINE HCL 25 MG PO TABS: 25.0000 mg | ORAL_TABLET | Freq: Four times a day (QID) | ORAL | 0 refills | Status: DC | PRN

## 2019-02-01 MED ORDER — DEXAMETHASONE SODIUM PHOSPHATE 10 MG/ML IJ SOLN
10.0000 mg | Freq: Once | INTRAMUSCULAR | Status: AC
  Administered 2019-02-01: 10 mg via INTRAMUSCULAR

## 2019-02-01 NOTE — Discharge Instructions (Signed)
Take Z-Pak and Phenergan as prescribed. Important to continue monitoring your fever, oxygenation level. Go to ER for worsening shortness of breath, O2 saturation below 90, chest pain, fevers greater than 100 62F with Tylenol.

## 2019-02-01 NOTE — ED Notes (Signed)
Patient able to ambulate independently  

## 2019-02-01 NOTE — ED Triage Notes (Signed)
Pt presents to [facility] for assessment of fever of 101+ since testing positive for COVID.  States it goes away with Tylenol but keeps coming back.  Pt states she has been on prednisone for 4 days now.  States she was sent here for evaluation by health at work [place].

## 2019-02-01 NOTE — ED Provider Notes (Addendum)
[facility]    CSN: [id number] Arrival date & time: 02/01/19  1312      History   Chief Complaint Chief Complaint  Patient presents with  . Fever    HPI Michele Mcbride is a 33 y.o. female who tested positive for Covid on 11/20 presenting for persistent fever despite Tylenol use.  Patient underwent Covid testing due to cough on 11/19.  States cough is persistent, nonproductive, and without wheezing/chest tightness.  Patient states she was assessed by PCP, last done 11/23: Given prednisone taper which patient states has not helped.  Patient taking Tylenol as directed with temperature remaining around 100 point 87F.  T-max 1087F w/o apap.  At baseline, patient uses Symbicort: Compliant with this twice daily regimen.  Patient has been using albuterol inhaler without significant help.  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  Past Medical History:  Diagnosis Date  . Asthma   . Family history of breast cancer   . Migraine   . Monoallelic mutation of CHEK2 gene in female patient   . PCOS (polycystic ovarian syndrome)   . Sinus infection     Patient Active Problem List   Diagnosis Date Noted  . Breast cancer screening, high risk patient 01/14/2019  . At high risk for breast cancer 01/14/2019  . Colon cancer high risk 01/14/2019  . Monoallelic mutation of CHEK2 gene in female patient   . Genetic testing 12/10/2018  . Family history of breast cancer   . Chronic migraine without aura, with intractable migraine, so stated, with status migrainosus 08/14/2017  . Insomnia 10/20/2016  . Physical exam 04/29/2016  . 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 Acute pharyngitis 09/27/2013  . Anxiety and depression 09/19/2012  . Sinusitis 03/09/2012  . PALPITATIONS 01/27/2010  . Migraine 12/19/2007  . ALLERGIC RHINITIS 07/06/2007    Past Surgical History:  Procedure Laterality Date  .  WISDOM TOOTH EXTRACTION      OB History    Gravida  2   Para  2   Term  2   Preterm      AB      Living        SAB      TAB      Ectopic      Multiple      Live Births               Home Medications    Prior to Admission medications   Medication Sig Start Date End Date Taking? Authorizing Provider  albuterol (PROVENTIL HFA;VENTOLIN HFA) 108 (90 Base) MCG/ACT inhaler Inhale into the lungs every 6 (six) hours as needed for wheezing or shortness of breath.    [provider]  azithromycin (ZITHROMAX) 250 MG tablet Take 1 tablet (250 mg total) by mouth daily. Take first 2 tablets together, then 1 every day until finished. 02/01/19   Hall-Potvin, [place], PA-C  butalbital-acetaminophen-caffeine (FIORICET) 50-325-40 MG tablet Take 1 tablet by mouth every 6 (six) hours as needed for headache. Patient not taking: Reported on 01/14/2019 08/09/18   Melvenia Beam, MD  cyclobenzaprine (FLEXERIL) 10 MG tablet TAKE 1 TABLET BY MOUTH 3 TIMES DAILY AS NEEDED FOR MUSCLE SPASMS. Patient taking differently: Take 10 mg by mouth 3 (three) times daily as needed for muscle spasms.  04/03/18   Melvenia Beam, MD  desloratadine (CLARINEX) 5 MG tablet Take 5 mg by  mouth daily.    [provider]  EPINEPHrine (AUVI-Q) 0.3 mg/0.3 mL IJ SOAJ injection Inject 0.3 mg into the muscle once.    [provider]  Erenumab-aooe (AIMOVIG) 140 MG/ML SOAJ Inject 140 mg into the skin every 30 (thirty) days. Please call to schedule a follow-up for on or around October 2020. 09/04/18   Melvenia Beam, MD  escitalopram (LEXAPRO) 20 MG tablet Take 1 tablet (20 mg total) by mouth daily. 10/08/18   Melvenia Beam, MD  ibuprofen (ADVIL,MOTRIN) 200 MG tablet Take 600 mg by mouth every 6 (six) hours as needed for headache or mild pain.    [provider]  naratriptan (AMERGE) 2.5 MG tablet Take 1 tablet (2.5 mg total) by mouth as needed for migraine. may repeat after 2 hours.  10/08/18   Melvenia Beam, MD  ondansetron (ZOFRAN) 4 MG tablet Take 1 tablet (4 mg total) by mouth every 8 (eight) hours as needed for nausea or vomiting. 12/11/17   Melvenia Beam, MD  predniSONE (DELTASONE) 10 MG tablet 3 tabs x3 days and then 2 tabs x3 days and then 1 tab x3 days.  Take w/ food. 01/29/19   Midge Minium, MD  promethazine (PHENERGAN) 25 MG tablet Take 1 tablet (25 mg total) by mouth every 6 (six) hours as needed for nausea or vomiting. 02/01/19   Hall-Potvin, [place], PA-C  QVAR REDIHALER 80 MCG/ACT inhaler Inhale 2 puffs into the lungs 2 (two) times daily.  09/23/16   [provider]  Rimegepant Sulfate (NURTEC) 75 MG TBDP Take 75 mg by mouth daily as needed. For migraines. Take as close to onset of migraine as possible. One daily maximum. 10/08/18   Melvenia Beam, MD  spironolactone (ALDACTONE) 50 MG tablet Take 50 mg by mouth 2 (two) times daily.  10/17/16   [provider]  SYMBICORT 80-4.5 MCG/ACT inhaler Inhale 2 puffs into the lungs 2 (two) times daily. 09/14/18   [provider]  tamoxifen (NOLVADEX) 20 MG tablet Take 1 tablet (20 mg total) by mouth daily. To start 02/05/2019 01/28/19 02/27/19  Magrinat, Virgie Dad, MD  TROKENDI XR 200 MG CP24 TAKE 1 CAPSULE BY MOUTH AT BEDTIME. 10/03/18   Melvenia Beam, MD  Etonogestrel-Ethinyl Estradiol [facility]) Place vaginally.  02/01/19  [provider]  SUMAtriptan (TOSYMRA) 10 MG/ACT SOLN Place 1 spray into the nose every hour as needed. Max 3 sprays daily. 10/08/18 02/01/19  Melvenia Beam, MD    Family History Family History  Problem Relation Age of Onset  . Hyperlipidemia Mother   . Neuropathy Mother   . Migraines Mother   . Breast cancer Mother 66  . Migraines Other        sisters, aunts, and grandmother had migraines on mother's side    Social History Social History   Tobacco Use  . Smoking status: Never Smoker  . Smokeless tobacco: Never Used  Substance Use Topics   . Alcohol use: No  . Drug use: No     Allergies   Strawberry extract and Latex   Review of Systems Review of Systems  Constitutional: Positive for activity change, appetite change, fatigue and fever.  HENT: Negative for ear pain, sinus pain, sore throat and voice change.   Eyes: Negative for pain, redness and visual disturbance.  Respiratory: Positive for cough and wheezing. Negative for chest tightness and shortness of breath.   Cardiovascular: Negative for chest pain and palpitations.  Gastrointestinal: Positive for  nausea. Negative for abdominal distention, abdominal pain, blood in stool, diarrhea and vomiting.  Musculoskeletal: Negative for arthralgias and myalgias.  Skin: Negative for rash and wound.  Neurological: Negative for syncope and headaches.     Physical Exam Triage Vital Signs ED Triage Vitals  Enc Vitals Group     BP 02/01/19 1338 119/86     Pulse Rate 02/01/19 1338 (!) 118     Resp 02/01/19 1338 20     Temp 02/01/19 1338 98.1 F (36.7 C)     Temp Source 02/01/19 1338 Temporal     SpO2 02/01/19 1338 97 %     Weight --      Height --      Head Circumference --      Peak Flow --      Pain Score 02/01/19 1340 5     Pain Loc --      Pain Edu? --      Excl. in [place]? --    No data found.  Updated Vital Signs BP 119/86 (BP Location: Right Arm)   Pulse (!) 118   Temp 98.1 F (36.7 C) (Temporal)   Resp 20   SpO2 97%   Visual Acuity Right Eye Distance:   Left Eye Distance:   Bilateral Distance:    Right Eye Near:   Left Eye Near:    Bilateral Near:     Physical Exam Constitutional:      General: She is not in acute distress.    Appearance: She is normal weight. She is ill-appearing.  HENT:     Head: Normocephalic and atraumatic.     Mouth/Throat:     Mouth: Mucous membranes are moist.     Pharynx: Oropharynx is clear.  Eyes:     General: No scleral icterus.    Pupils: Pupils are equal, round, and reactive to light.  Neck:      Musculoskeletal: Neck supple. No muscular tenderness.  Cardiovascular:     Rate and Rhythm: Regular rhythm. Tachycardia present.     Heart sounds: No murmur. No gallop.   Pulmonary:     Effort: Pulmonary effort is normal. No respiratory distress.     Breath sounds: No wheezing.     Comments: Decreased breath sounds bilaterally with coarse lung sounds in right upper lobe, left mid-lower lobe fields Chest:     Chest wall: No tenderness.  Lymphadenopathy:     Cervical: No cervical adenopathy.  Skin:    General: Skin is warm.     Capillary Refill: Capillary refill takes less than 2 seconds.     Coloration: Skin is not jaundiced or pale.  Neurological:     General: No focal deficit present.     Mental Status: She is alert and oriented to person, place, and time.      UC Treatments / Results  Labs (all labs ordered are listed, but only abnormal results are displayed) Labs Reviewed - No data to display  EKG   Radiology No results found.  Procedures Procedures (including critical care time)  Medications Ordered in UC Medications  dexamethasone (DECADRON) injection 10 mg (10 mg Intramuscular Given 02/01/19 1431)    Initial Impression / Assessment and Plan / UC Course  I have reviewed the triage vital signs and the nursing notes.  Pertinent labs & imaging results that were available during my care of the patient were reviewed by me and considered in my medical decision making (see chart for details).  H&P consistent with COVID-19 infection.  Reviewed data suggesting superimposed bacterial pneumonia in this setting is very rare.  Patient given Decadron in office which he tolerated well.  We will continue supportive care, monitoring oxygen levels, and add azithromycin for atypical pneumonia/anti-inflammatory component.  Will give Phenergan, which patient has tolerated well, for nausea.  Reviewed the patient can take daytime antihistamine, famotidine, vitamin D as these are  currently being trialed for adjuvant therapies of Covid and relatively low risk given patient's comorbidities.  Return precautions discussed, patient verbalized understanding and is agreeable to plan. Final Clinical Impressions(s) / UC Diagnoses   Final diagnoses:  COVID-19  Nausea without vomiting  Cough     Discharge Instructions     Take Z-Pak and Phenergan as prescribed. Important to continue monitoring your fever, oxygenation level. Go to ER for worsening shortness of breath, O2 saturation below 90, chest pain, fevers greater than 100 74F with Tylenol.    ED Prescriptions    Medication Sig Dispense Auth. Provider   azithromycin (ZITHROMAX) 250 MG tablet Take 1 tablet (250 mg total) by mouth daily. Take first 2 tablets together, then 1 every day until finished. 6 tablet Hall-Potvin, [place], PA-C   promethazine (PHENERGAN) 25 MG tablet Take 1 tablet (25 mg total) by mouth every 6 (six) hours as needed for nausea or vomiting. 30 tablet Hall-Potvin, [place], PA-C     PDMP not reviewed this encounter.   Hall-Potvin, [place], PA-C 02/01/19 1910    Neldon Mc [place], [place] 02/01/19 1911

## 2019-02-05 ENCOUNTER — Telehealth: Payer: Self-pay | Admitting: Neurology

## 2019-02-05 NOTE — Telephone Encounter (Signed)
Calledc pt and lvm to call [employer] to make payment of $50 to have FMLA forms completed.

## 2019-02-08 ENCOUNTER — Encounter: Payer: Self-pay | Admitting: Family Medicine

## 2019-02-13 ENCOUNTER — Ambulatory Visit (INDEPENDENT_AMBULATORY_CARE_PROVIDER_SITE_OTHER): Payer: 59 | Admitting: Family Medicine

## 2019-02-13 ENCOUNTER — Encounter: Payer: Self-pay | Admitting: Family Medicine

## 2019-02-13 ENCOUNTER — Other Ambulatory Visit: Payer: Self-pay

## 2019-02-13 VITALS — HR 109 | Temp 99.6°F | Ht 62.0 in | Wt 150.0 lb

## 2019-02-13 DIAGNOSIS — U071 COVID-19: Secondary | ICD-10-CM

## 2019-02-13 DIAGNOSIS — Z0289 Encounter for other administrative examinations: Secondary | ICD-10-CM

## 2019-02-13 NOTE — Progress Notes (Signed)
I have discussed the procedure for the virtual visit with the patient who has given consent to proceed with assessment and treatment.   Yazir Koerber L Jhon Mallozzi, CMA     

## 2019-02-13 NOTE — Progress Notes (Signed)
Virtual Visit via Video   I connected with patient on 02/13/19 at  3:30 PM EST by a video enabled telemedicine application and verified that I am speaking with the correct person using two identifiers.  Location patient: Home Location provider: Acupuncturist, Office Persons participating in the virtual visit: Patient, Provider, [place] (Jess B)  I discussed the limitations of evaluation and management by telemedicine and the availability of in person appointments. The patient expressed understanding and agreed to proceed.  Subjective:   HPI:   COVID- tested + on 11/20.  'it's been quite a battle since then'.  Continues to have daily headaches, low grade fever, cough, and chest tightness.  Body aches have resolved.  + fatigue.  Has lost taste and smell.  No known sick contacts- source unknown.  'it's all I can do to feed my kids'.    ROS:   See pertinent positives and negatives per HPI.  Patient Active Problem List   Diagnosis Date Noted  . Breast cancer screening, high risk patient 01/14/2019  . At high risk for breast cancer 01/14/2019  . Colon cancer high risk 01/14/2019  . Monoallelic mutation of CHEK2 gene in female patient   . Genetic testing 12/10/2018  . Family history of breast cancer   . Chronic migraine without aura, with intractable migraine, so stated, with status migrainosus 08/14/2017  . Insomnia 10/20/2016  . Physical exam 04/29/2016  . 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 Acute pharyngitis 09/27/2013  . Anxiety and depression 09/19/2012  . Sinusitis 03/09/2012  . PALPITATIONS 01/27/2010  . Migraine 12/19/2007  . ALLERGIC RHINITIS 07/06/2007    Social History   Tobacco Use  . Smoking status: Never Smoker  . Smokeless tobacco: Never Used  Substance Use Topics  . Alcohol use: No    Current Outpatient Medications:  .  norethindrone (MICRONOR) 0.35 MG tablet, Take by mouth., Disp: , Rfl:  .   albuterol (PROVENTIL HFA;VENTOLIN HFA) 108 (90 Base) MCG/ACT inhaler, Inhale into the lungs every 6 (six) hours as needed for wheezing or shortness of breath., Disp: , Rfl:  .  butalbital-acetaminophen-caffeine (FIORICET) 50-325-40 MG tablet, Take 1 tablet by mouth every 6 (six) hours as needed for headache. (Patient not taking: Reported on 01/14/2019), Disp: 30 tablet, Rfl: 2 .  cyclobenzaprine (FLEXERIL) 10 MG tablet, TAKE 1 TABLET BY MOUTH 3 TIMES DAILY AS NEEDED FOR MUSCLE SPASMS. (Patient taking differently: Take 10 mg by mouth 3 (three) times daily as needed for muscle spasms. ), Disp: 90 tablet, Rfl: 3 .  desloratadine (CLARINEX) 5 MG tablet, Take 5 mg by mouth daily., Disp: , Rfl:  .  EPINEPHrine (AUVI-Q) 0.3 mg/0.3 mL IJ SOAJ injection, Inject 0.3 mg into the muscle once., Disp: , Rfl:  .  Erenumab-aooe (AIMOVIG) 140 MG/ML SOAJ, Inject 140 mg into the skin every 30 (thirty) days. Please call to schedule a follow-up for on or around October 2020., Disp: 1 mL, Rfl: 4 .  escitalopram (LEXAPRO) 20 MG tablet, Take 1 tablet (20 mg total) by mouth daily., Disp: 90 tablet, Rfl: 4 .  naratriptan (AMERGE) 2.5 MG tablet, Take 1 tablet (2.5 mg total) by mouth as needed for migraine. may repeat after 2 hours., Disp: 10 tablet, Rfl: 11 .  ondansetron (ZOFRAN) 4 MG tablet, Take 1 tablet (4 mg total) by mouth every 8 (eight) hours as needed for nausea or vomiting., Disp: 30 tablet, Rfl: 11 .  promethazine (PHENERGAN) 25 MG  tablet, Take 1 tablet (25 mg total) by mouth every 6 (six) hours as needed for nausea or vomiting., Disp: 30 tablet, Rfl: 0 .  QVAR REDIHALER 80 MCG/ACT inhaler, Inhale 2 puffs into the lungs 2 (two) times daily. , Disp: , Rfl: 5 .  Rimegepant Sulfate (NURTEC) 75 MG TBDP, Take 75 mg by mouth daily as needed. For migraines. Take as close to onset of migraine as possible. One daily maximum., Disp: 10 tablet, Rfl: 6 .  spironolactone (ALDACTONE) 50 MG tablet, Take 50 mg by mouth 2 (two) times  daily. , Disp: , Rfl: 3 .  SYMBICORT 80-4.5 MCG/ACT inhaler, Inhale 2 puffs into the lungs 2 (two) times daily., Disp: , Rfl:  .  tamoxifen (NOLVADEX) 20 MG tablet, Take 1 tablet (20 mg total) by mouth daily. To start 02/05/2019, Disp: 90 tablet, Rfl: 12 .  TROKENDI XR 200 MG CP24, TAKE 1 CAPSULE BY MOUTH AT BEDTIME., Disp: 30 capsule, Rfl: 11  Allergies  Allergen Reactions  . Strawberry Extract Anaphylaxis  . Latex Rash    Rash and sores    Objective:   Pulse (!) 109   Temp 99.6 F (37.6 C) (Tympanic)   Ht '5\' 2"'  (1.575 m)   Wt 150 lb (68 kg)   BMI 27.44 kg/m  AAOx3, NAD NCAT, EOMI No obvious CN deficits Coloring WNL Pt is able to speak clearly, coherently without shortness of breath or increased work of breathing.  Thought process is linear.  Mood is appropriate.   Assessment and Plan:   COVID- new.  pt tested + on 11/20.  She is 19 days into illness and still struggling w/ daily HAs, chest tightness, cough, low grade fevers and excessive fatigue.  Due to this, she is not ready or able to return to work.  I will complete FMLA forms to excuse her time missed with the idea to first return for 4-6 hrs/day to build her stamina.  Reviewed supportive care and red flags that should prompt return.  Pt expressed understanding and is in agreement w/ plan.    Annye Asa, MD 02/13/2019

## 2019-02-14 NOTE — Telephone Encounter (Signed)
FMLA forms completed, signed, and sent to medical records for processing.  

## 2019-02-18 ENCOUNTER — Encounter: Payer: Self-pay | Admitting: Family Medicine

## 2019-02-18 NOTE — Telephone Encounter (Signed)
I haven't seen any paperwork for her that I can recall. I would have sent a message back if I got them off the fax machine.

## 2019-02-19 ENCOUNTER — Encounter: Payer: Self-pay | Admitting: Family Medicine

## 2019-02-21 DIAGNOSIS — Z0279 Encounter for issue of other medical certificate: Secondary | ICD-10-CM

## 2019-02-22 ENCOUNTER — Ambulatory Visit
Admission: RE | Admit: 2019-02-22 | Discharge: 2019-02-22 | Disposition: A | Discharge status: Home / Self Care | Payer: 59 | Source: Ambulatory Visit | Attending: Oncology | Admitting: Oncology

## 2019-02-22 ENCOUNTER — Encounter: Payer: Self-pay | Admitting: Family Medicine

## 2019-02-22 ENCOUNTER — Other Ambulatory Visit: Payer: Self-pay | Admitting: Neurology

## 2019-02-22 DIAGNOSIS — Z1501 Genetic susceptibility to malignant neoplasm of breast: Secondary | ICD-10-CM

## 2019-02-22 DIAGNOSIS — Z9189 Other specified personal risk factors, not elsewhere classified: Secondary | ICD-10-CM

## 2019-02-22 DIAGNOSIS — Z803 Family history of malignant neoplasm of breast: Secondary | ICD-10-CM | POA: Diagnosis not present

## 2019-02-22 DIAGNOSIS — Z1239 Encounter for other screening for malignant neoplasm of breast: Secondary | ICD-10-CM

## 2019-02-22 DIAGNOSIS — N6312 Unspecified lump in the right breast, upper inner quadrant: Secondary | ICD-10-CM | POA: Diagnosis not present

## 2019-02-22 MED ORDER — GADOBUTROL 1 MMOL/ML IV SOLN
7.0000 mL | Freq: Once | INTRAVENOUS | Status: AC | PRN
  Administered 2019-02-22: 7 mL via INTRAVENOUS

## 2019-02-22 MED FILL — AIMOVIG 140 MG/ML SOAJ: 140 | 30 days supply | Qty: 1 | Fill #4

## 2019-02-22 MED FILL — SPIRONOLACTONE 50 MG TABS: 50 | 60 days supply | Qty: 120 | Fill #1

## 2019-02-22 MED FILL — ESCITALOPRAM 20 MG TABLET: 20 | 90 days supply | Qty: 90 | Fill #1

## 2019-02-22 MED FILL — NURTEC 75 MG TBDP: 75 | 30 days supply | Qty: 8 | Fill #3

## 2019-02-22 MED FILL — NARATRIPTAN HCL 2.5 MG TAB: 2.5 | 17 days supply | Qty: 10 | Fill #3

## 2019-02-22 MED FILL — BUTALB-ACETAMIN-CAFF 50-325: 50-325-40 | 90 days supply | Qty: 30 | Fill #1

## 2019-02-22 MED FILL — CYCLOBENZAPRINE HCL 10 MG T: 10 | 30 days supply | Qty: 90 | Fill #2

## 2019-02-25 ENCOUNTER — Encounter: Payer: Self-pay | Admitting: Oncology

## 2019-02-26 ENCOUNTER — Other Ambulatory Visit: Payer: Self-pay | Admitting: *Deleted

## 2019-02-26 ENCOUNTER — Other Ambulatory Visit: Payer: Self-pay | Admitting: Oncology

## 2019-02-26 DIAGNOSIS — Z1239 Encounter for other screening for malignant neoplasm of breast: Secondary | ICD-10-CM

## 2019-02-26 DIAGNOSIS — R928 Other abnormal and inconclusive findings on diagnostic imaging of breast: Secondary | ICD-10-CM

## 2019-03-11 ENCOUNTER — Encounter: Payer: Self-pay | Admitting: Family Medicine

## 2019-03-15 ENCOUNTER — Other Ambulatory Visit: Payer: Self-pay | Admitting: Oncology

## 2019-03-15 ENCOUNTER — Other Ambulatory Visit: Payer: Self-pay

## 2019-03-15 ENCOUNTER — Ambulatory Visit
Admission: RE | Admit: 2019-03-15 | Discharge: 2019-03-15 | Disposition: A | Discharge status: Home / Self Care | Payer: 59 | Source: Ambulatory Visit | Attending: Oncology | Admitting: Oncology

## 2019-03-15 DIAGNOSIS — R928 Other abnormal and inconclusive findings on diagnostic imaging of breast: Secondary | ICD-10-CM

## 2019-03-15 DIAGNOSIS — Z1239 Encounter for other screening for malignant neoplasm of breast: Secondary | ICD-10-CM

## 2019-03-15 DIAGNOSIS — N6001 Solitary cyst of right breast: Secondary | ICD-10-CM

## 2019-03-15 DIAGNOSIS — N6012 Diffuse cystic mastopathy of left breast: Secondary | ICD-10-CM | POA: Diagnosis not present

## 2019-03-15 DIAGNOSIS — N6320 Unspecified lump in the left breast, unspecified quadrant: Secondary | ICD-10-CM

## 2019-03-15 DIAGNOSIS — N6489 Other specified disorders of breast: Secondary | ICD-10-CM | POA: Diagnosis not present

## 2019-03-15 DIAGNOSIS — N6310 Unspecified lump in the right breast, unspecified quadrant: Secondary | ICD-10-CM

## 2019-03-18 ENCOUNTER — Other Ambulatory Visit: Payer: Self-pay | Admitting: Oncology

## 2019-03-18 DIAGNOSIS — N6001 Solitary cyst of right breast: Secondary | ICD-10-CM

## 2019-03-18 DIAGNOSIS — N6310 Unspecified lump in the right breast, unspecified quadrant: Secondary | ICD-10-CM

## 2019-03-18 DIAGNOSIS — N6320 Unspecified lump in the left breast, unspecified quadrant: Secondary | ICD-10-CM

## 2019-03-20 ENCOUNTER — Ambulatory Visit
Admission: RE | Admit: 2019-03-20 | Discharge: 2019-03-20 | Disposition: A | Discharge status: Home / Self Care | Payer: 59 | Source: Ambulatory Visit | Attending: Oncology | Admitting: Oncology

## 2019-03-20 ENCOUNTER — Other Ambulatory Visit: Payer: Self-pay | Admitting: Oncology

## 2019-03-20 ENCOUNTER — Other Ambulatory Visit: Payer: Self-pay

## 2019-03-20 DIAGNOSIS — N6001 Solitary cyst of right breast: Secondary | ICD-10-CM | POA: Diagnosis not present

## 2019-03-20 DIAGNOSIS — D241 Benign neoplasm of right breast: Secondary | ICD-10-CM | POA: Diagnosis not present

## 2019-03-20 DIAGNOSIS — N6310 Unspecified lump in the right breast, unspecified quadrant: Secondary | ICD-10-CM

## 2019-03-20 DIAGNOSIS — N6323 Unspecified lump in the left breast, lower outer quadrant: Secondary | ICD-10-CM | POA: Diagnosis not present

## 2019-03-20 DIAGNOSIS — N6312 Unspecified lump in the right breast, upper inner quadrant: Secondary | ICD-10-CM | POA: Diagnosis not present

## 2019-03-20 DIAGNOSIS — N6311 Unspecified lump in the right breast, upper outer quadrant: Secondary | ICD-10-CM | POA: Diagnosis not present

## 2019-03-20 DIAGNOSIS — N6320 Unspecified lump in the left breast, unspecified quadrant: Secondary | ICD-10-CM

## 2019-03-20 DIAGNOSIS — N6315 Unspecified lump in the right breast, overlapping quadrants: Secondary | ICD-10-CM | POA: Diagnosis not present

## 2019-03-20 DIAGNOSIS — D242 Benign neoplasm of left breast: Secondary | ICD-10-CM | POA: Diagnosis not present

## 2019-03-20 HISTORY — PX: BREAST BIOPSY: SHX20

## 2019-03-21 ENCOUNTER — Other Ambulatory Visit: Payer: Self-pay | Admitting: Oncology

## 2019-03-21 DIAGNOSIS — R9389 Abnormal findings on diagnostic imaging of other specified body structures: Secondary | ICD-10-CM

## 2019-03-25 ENCOUNTER — Encounter: Payer: Self-pay | Admitting: Oncology

## 2019-04-05 ENCOUNTER — Other Ambulatory Visit: Payer: Self-pay | Admitting: Oncology

## 2019-04-05 ENCOUNTER — Other Ambulatory Visit: Payer: Self-pay

## 2019-04-05 ENCOUNTER — Ambulatory Visit: Payer: 59

## 2019-04-05 ENCOUNTER — Ambulatory Visit
Admission: RE | Admit: 2019-04-05 | Discharge: 2019-04-05 | Disposition: A | Discharge status: Home / Self Care | Payer: 59 | Source: Ambulatory Visit | Attending: Oncology | Admitting: Oncology

## 2019-04-05 DIAGNOSIS — N6312 Unspecified lump in the right breast, upper inner quadrant: Secondary | ICD-10-CM | POA: Diagnosis not present

## 2019-04-05 DIAGNOSIS — R9389 Abnormal findings on diagnostic imaging of other specified body structures: Secondary | ICD-10-CM

## 2019-04-05 DIAGNOSIS — N6311 Unspecified lump in the right breast, upper outer quadrant: Secondary | ICD-10-CM | POA: Diagnosis not present

## 2019-04-05 MED ORDER — GADOBUTROL 1 MMOL/ML IV SOLN
7.0000 mL | Freq: Once | INTRAVENOUS | Status: AC | PRN
  Administered 2019-04-05: 7 mL via INTRAVENOUS

## 2019-04-20 ENCOUNTER — Telehealth: Payer: 59 | Admitting: Nurse Practitioner

## 2019-04-20 DIAGNOSIS — B373 Candidiasis of vulva and vagina: Secondary | ICD-10-CM | POA: Diagnosis not present

## 2019-04-20 DIAGNOSIS — B3731 Acute candidiasis of vulva and vagina: Secondary | ICD-10-CM

## 2019-04-20 MED ORDER — FLUCONAZOLE 150 MG PO TABS: 150.0000 mg | ORAL_TABLET | Freq: Once | ORAL | 0 refills | Status: AC

## 2019-04-20 NOTE — Progress Notes (Signed)

## 2019-04-24 MED FILL — FLUCONAZOLE 150 MG TABS: 150 | 1 days supply | Qty: 1 | Fill #0

## 2019-04-26 ENCOUNTER — Ambulatory Visit: Payer: Self-pay | Admitting: Surgery

## 2019-04-26 DIAGNOSIS — D369 Benign neoplasm, unspecified site: Secondary | ICD-10-CM

## 2019-04-26 DIAGNOSIS — D241 Benign neoplasm of right breast: Secondary | ICD-10-CM

## 2019-04-26 DIAGNOSIS — D249 Benign neoplasm of unspecified breast: Secondary | ICD-10-CM | POA: Diagnosis not present

## 2019-04-26 DIAGNOSIS — D242 Benign neoplasm of left breast: Secondary | ICD-10-CM | POA: Diagnosis not present

## 2019-04-26 NOTE — H&P (Signed)
Michele Mcbride Documented: 04/26/2019 9:15 AM Location: [facility] Patient #: [id number] DOB: 11-Sep-1985 Divorced / Language: Michele Mcbride / Race: White Female  History of Present Illness Michele Moores A. Kennieth Plotts MD; 04/26/2019 1:07 PM) Patient words: Patient sent at the request of Dr.hu [facility] [place] secondary to magnetic resonance imaging findings of multiple breast masses. She had 2 areas in her right breast detected by magnetic resonance imaging. One was a fibroadenoma the second was a papilloma. The left side she had a fibroadenoma. She underwent magnetic resonance imaging secondary to family history of breast cancer and was felt to be at elevated risk of developing breast cancer. She is now on tamoxifen followed by Dr. Jana Mcbride. Her mother also had breast cancer in her 4s which prompted the magnetic resonance imaging. She is sore from the magnetic resonance imaging and core biopsy otherwise has no other complaints. No history of nipple discharge, breast mass or breast pain.      CLINICAL DATA: 34 year old female returns for possible biopsy of a 1.3 cm mass within the UPPER-OUTER RIGHT breast, anterior to middle depth. Two RIGHT breast masses were identified on recent ultrasound which were biopsied demonstrating a fibroadenoma and papilloma. It was uncertain whether the UPPER-OUTER RIGHT breast mass identified on 02/22/2019 MR represents 1 of the masses biopsied under ultrasound guidance.  LABS: None performed today  EXAM: MR OF THE RIGHT BREAST WITH AND WITHOUT CONTRAST  TECHNIQUE: Multiplanar, multisequence MR images of the right breast were obtained prior to and following the intravenous administration of 7 ml of Gadavist.  Three-dimensional MR images were rendered by post-processing of the original MR data on an independent workstation. The three-dimensional MR images were interpreted, and findings are reported in the following complete MRI report  for this study. Three dimensional images were evaluated at the independent DynaCad workstation  COMPARISON: Previous exam(s).  FINDINGS: Breast composition: b. Scattered fibroglandular tissue.  Background parenchymal enhancement: Mild  Right breast: The 1.1 cm UPPER-OUTER RIGHT breast mass identified on 02/22/2019 MR contains biopsy clip artifact and compatible with biopsy-proven fibroadenoma.  The 1.3 cm UPPER RIGHT breast mass identified at the 12-1 o'clock axis on 02/22/2019 MR contains biopsy clip artifact and compatible with biopsy-proven papilloma.  No other abnormal or suspicious areas of enhancement are identified.  Lymph nodes: No abnormal appearing lymph nodes.  Ancillary findings: None.  IMPRESSION: 1. The 2 enhancing RIGHT breast masses identified on 02/22/2019 MR contains biopsy clip artifact and represents the 2 RIGHT breast masses that were identified by ultrasound and biopsied (fibroadenoma and papilloma). As the UPPER-OUTER RIGHT breast mass has been sampled sonographically, MR guided biopsy was canceled.  RECOMMENDATION: Patient has an existing appointment with Dr. Brantley Mcbride for the RIGHT breast papilloma.  BI-RADS CATEGORY 2: Benign.   Electronically Signed By: Michele Mcbride M.D. On: 04/05/2019 11:35      Diagnosis 1. Breast, right, needle core biopsy, 12 o'clock, 3cmfn, ribbon - FIBROADENOMA. - NO MALIGNANCY IDENTIFIED. 2. Breast, right, needle core biopsy, 12 o'clock, 2cmfn, heart - INTRADUCTAL PAPILLOMA. - NO ATYPIA OR MALIGNANCY. 3. Breast, left, needle core biopsy, 4 o'clock, 4cmfn, ribbon - FIBROADENOMA. - NO MALIGNANCY IDENTIFIED.  The patient is a 34 year old female.   Past Surgical History Michele Coke, RN; 04/26/2019 9:15 AM) Oral Surgery  Diagnostic Studies History Michele Coke, RN; 04/26/2019 9:15 AM) Colonoscopy never Mammogram within last year Pap Smear 1-5 years ago  Allergies Michele Coke, RN; 04/26/2019  9:16 AM) Latex Gloves *MEDICAL DEVICES AND SUPPLIES* Hives,  Rash, Pruritus.  Medication History (Michele Herrin, RN; 04/26/2019 9:20 AM) Aimovig (140MG/ML Soln Auto-inj, Subcutaneous) Active. Cyclobenzaprine HCl (10MG Tablet, Oral) Active. Etonogestrel-Ethinyl Estradiol (0.12-0.015MG/24HR Ring, Vaginal) Active. Ondansetron HCl (4MG Tablet, Oral) Active. Spironolactone (50MG Tablet, Oral) Active. Symbicort (80-4.5MCG/ACT Aerosol, Inhalation) Active. Trokendi XR (200MG Capsule ER 24HR, Oral) Active. Nurtec (75MG Tablet Disint, Oral) Active. Amerge (2.5MG Tablet, Oral) Active. Lexapro (20MG Tablet, Oral) Active. EpinephrineSnap-EMS (1MG/ML Kit, Injection) Active. Fioricet (50-300-40MG Capsule, Oral) Active. Albuterol (90MCG/ACT Aerosol Soln, Inhalation) Active. Medications Reconciled  Social History Michele Coke, RN; 04/26/2019 9:15 AM) Alcohol use Occasional alcohol use. Caffeine use Coffee. No drug use Tobacco use Never smoker.  Family History Michele Coke, RN; 04/26/2019 9:15 AM) Breast Cancer Mother. Migraine Headache Mother, Sister.  Pregnancy / Birth History Michele Coke, RN; 04/26/2019 9:15 AM) Age at menarche 62 years. Contraceptive History Oral contraceptives. Gravida 2 Irregular periods Maternal age 29-25 Para 2  Other Problems (Michele Herrin, RN; 04/26/2019 9:15 AM) Asthma Heart murmur Lump In Breast Migraine Headache     Review of Systems (Michele Herrin RN; 04/26/2019 9:15 AM) General Not Present- Appetite Loss, Chills, Fatigue, Fever, Night Sweats, Weight Gain and Weight Loss. Skin Not Present- Change in Wart/Mole, Dryness, Hives, Jaundice, New Lesions, Non-Healing Wounds, Rash and Ulcer. HEENT Not Present- Earache, Hearing Loss, Hoarseness, Nose Bleed, Oral Ulcers, Ringing in the Ears, Seasonal Allergies, Sinus Pain, Sore Throat, Visual Disturbances, Wears glasses/contact lenses and Yellow Eyes. Respiratory Not Present- Bloody  sputum, Chronic Cough, Difficulty Breathing, Snoring and Wheezing. Breast Present- Breast Mass and Breast Pain. Not Present- Nipple Discharge and Skin Changes. Cardiovascular Not Present- Chest Pain, Difficulty Breathing Lying Down, Leg Cramps, Palpitations, Rapid Heart Rate, Shortness of Breath and Swelling of Extremities. Gastrointestinal Not Present- Abdominal Pain, Bloating, Bloody Stool, Change in Bowel Habits, Chronic diarrhea, Constipation, Difficulty Swallowing, Excessive gas, Gets full quickly at meals, Hemorrhoids, Indigestion, Nausea, Rectal Pain and Vomiting. Female Genitourinary Not Present- Frequency, Nocturia, Painful Urination, Pelvic Pain and Urgency. Musculoskeletal Not Present- Back Pain, Joint Pain, Joint Stiffness, Muscle Pain, Muscle Weakness and Swelling of Extremities. Neurological Present- Headaches. Not Present- Decreased Memory, Fainting, Numbness, Seizures, Tingling, Tremor, Trouble walking and Weakness. Psychiatric Not Present- Anxiety, Bipolar, Change in Sleep Pattern, Depression, Fearful and Frequent crying. Endocrine Not Present- Cold Intolerance, Excessive Hunger, Hair Changes, Heat Intolerance, Hot flashes and New Diabetes. Hematology Not Present- Blood Thinners, Easy Bruising, Excessive bleeding, Gland problems, HIV and Persistent Infections.  Vitals (Michele Herrin RN; 04/26/2019 9:21 AM) 04/26/2019 9:20 AM Weight: 165.25 lb Height: 62in Body Surface Area: 1.76 m Body Mass Index: 30.22 kg/m  Temp.: 98.6F  Pulse: 126 (Regular)  P.OX: 99% (Room air) BP: 126/78 (Sitting, Left Arm, Standard)        Physical Exam (Michele Ryant A. Kden Wagster MD; 04/26/2019 1:09 PM)  Head and Neck Head-normocephalic, atraumatic with no lesions or palpable masses. Trachea-midline. Thyroid Gland Characteristics - normal size and consistency.  Chest and Lung Exam Chest and lung exam reveals -normal excursion with symmetric chest walls and quiet, even and easy  respiratory effort with no use of accessory muscles.  Breast Note: Bruising noted to bilateral breasts. No masses in either breast. No evidence of nipple discharge bilaterally.  Cardiovascular Inspection Jugular vein - Left - Inspection Normal. Jugular vein - Right - Inspection Normal. BP In 2+ Extremities - Indicated and See Vitals Section for details.  Neurologic Neurologic evaluation reveals -alert and oriented x 3 with no impairment of recent or remote memory. Mental Status-Normal.  Lymphatic Head & Neck  General Head & Neck Lymphatics: Bilateral - Description - Normal. Axillary  General Axillary Region: Bilateral - Description - Normal. Tenderness - Non Tender.    Assessment & Plan (Michele Dancy A. 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 Discussed observation as well. Risk of lumpectomy include bleeding, infection, seroma, more surgery, use of seed/wire, wound care, cosmetic deformity and the need for other treatments, death , blood clots, death. Pt agrees to proceed.  total time 30 minutes   PAPILLOMA OF BREAST (D24.9) Impression: right Discuss potential operative risk of 20%. Patient desires lumpectomy. Risk of lumpectomy include bleeding, infection, seroma, more surgery, use of seed/wire, wound care, cosmetic deformity and the need for other treatments, death , blood clots, death. Pt agrees to proceed.  Current Plans You are being scheduled for surgery- Our schedulers will call you.  You should hear from our office's scheduling department within 5 working days about the location, date, and time of surgery. We try to make accommodations for patient's preferences in scheduling surgery, but sometimes the OR schedule or the surgeon's schedule prevents [place] from making those accommodations.  If you have not heard from our office [phone]) in 5 working days, call the office and ask  for your surgeon's nurse.  If you have other questions about your diagnosis, plan, or surgery, call the office and ask for your surgeon's nurse.  Pt Education - [facility] Breast Biopsy HCI: discussed with patient and provided information.

## 2019-04-30 ENCOUNTER — Other Ambulatory Visit: Payer: Self-pay | Admitting: Neurology

## 2019-04-30 DIAGNOSIS — G43711 Chronic migraine without aura, intractable, with status migrainosus: Secondary | ICD-10-CM

## 2019-04-30 MED FILL — DESLORATADINE 5 MG TAB: 5 | 90 days supply | Qty: 90 | Fill #1

## 2019-04-30 MED FILL — NURTEC 75 MG TBDP: 75 | 30 days supply | Qty: 8 | Fill #4

## 2019-04-30 MED FILL — NARATRIPTAN HCL 2.5 MG TAB: 2.5 | 17 days supply | Qty: 10 | Fill #4

## 2019-04-30 MED FILL — CYCLOBENZAPRINE HCL 10 MG T: 10 | 30 days supply | Qty: 90 | Fill #0

## 2019-04-30 MED FILL — TROKENDI XR 200 MG CAPSULE: 200 | 30 days supply | Qty: 30 | Fill #2

## 2019-04-30 MED FILL — AIMOVIG 140 MG/ML SOAJ: 140 | 30 days supply | Qty: 1 | Fill #0

## 2019-04-30 MED FILL — SPIRONOLACTONE 50 MG TABLET: 50 | 60 days supply | Qty: 120 | Fill #2

## 2019-05-01 ENCOUNTER — Other Ambulatory Visit: Payer: Self-pay | Admitting: Neurology

## 2019-05-01 MED ORDER — ONDANSETRON HCL 4 MG PO TABS: 4.0000 mg | ORAL_TABLET | Freq: Three times a day (TID) | ORAL | 11 refills | Status: DC | PRN

## 2019-05-01 MED FILL — ONDANSETRON HCL 4 MG TABLET: 4 | 10 days supply | Qty: 30 | Fill #0

## 2019-05-06 ENCOUNTER — Encounter: Payer: Self-pay | Admitting: *Deleted

## 2019-05-06 NOTE — Telephone Encounter (Signed)
Completed renewal PA for Nurtec. KeyGP:[id number]. Awaiting medimpact determination.

## 2019-05-06 NOTE — Telephone Encounter (Signed)
Aimovig 140 mg PA completed on CMM for continuation. Pending determination. KeyTW:[id number].

## 2019-05-09 ENCOUNTER — Encounter: Payer: Self-pay | Admitting: *Deleted

## 2019-05-09 NOTE — Telephone Encounter (Addendum)
Per Cover My Meds:  The request has been approved. The authorization is effective for a maximum of 12 fills from 05/16/2019 to 05/14/2020, as long as the member is enrolled in their current health plan. The request was approved as submitted. This has been approved for up to 16 tablets per month. Please note current approved prior authorization [id number] for Nurtec is effective 11/16/2018 until 05/15/2019 for 16 tablets per month with 3 fills remaining. A written notification letter will follow with additional details.

## 2019-05-09 NOTE — Telephone Encounter (Signed)
aimovig approval noticed faxed to pt's pharmacy. Received a receipt of confirmation.

## 2019-05-09 NOTE — Telephone Encounter (Signed)
Per Cover My Meds:   The request has been approved. The authorization is effective for a maximum of 12 fills from 05/15/2019 to 05/13/2020, as long as the member is enrolled in their current health plan. The request was approved as submitted. This request is approved for #21mL (one 140mg /mL autoinjector) per 30 days. A written notification letter will follow with additional details.

## 2019-05-13 ENCOUNTER — Other Ambulatory Visit: Payer: Self-pay | Admitting: Surgery

## 2019-05-13 DIAGNOSIS — D242 Benign neoplasm of left breast: Secondary | ICD-10-CM

## 2019-05-13 DIAGNOSIS — D241 Benign neoplasm of right breast: Secondary | ICD-10-CM

## 2019-05-13 DIAGNOSIS — D369 Benign neoplasm, unspecified site: Secondary | ICD-10-CM

## 2019-05-22 ENCOUNTER — Telehealth: Payer: 59 | Admitting: Nurse Practitioner

## 2019-05-22 DIAGNOSIS — N760 Acute vaginitis: Secondary | ICD-10-CM

## 2019-05-22 DIAGNOSIS — B9689 Other specified bacterial agents as the cause of diseases classified elsewhere: Secondary | ICD-10-CM

## 2019-05-22 MED ORDER — METRONIDAZOLE 500 MG PO TABS: 500.0000 mg | ORAL_TABLET | Freq: Two times a day (BID) | ORAL | 0 refills | Status: DC

## 2019-05-22 MED FILL — METRONIDAZOLE 500 MG TABS: 500 | 7 days supply | Qty: 14 | Fill #0

## 2019-05-22 NOTE — Progress Notes (Signed)

## 2019-05-24 MED FILL — BUTALBITAL-APAP-CAFFEINE 50: 50-325-40 | 90 days supply | Qty: 30 | Fill #2

## 2019-05-24 MED FILL — SYMBICORT 80-4.5 MCG INH: 80-4.5 | 30 days supply | Qty: 10 | Fill #2

## 2019-05-24 MED FILL — NARATRIPTAN HCL 2.5 MG TAB: 2.5 | 17 days supply | Qty: 10 | Fill #5

## 2019-05-24 MED FILL — NURTEC 75 MG TBDP: 75 | 30 days supply | Qty: 8 | Fill #5

## 2019-05-24 MED FILL — AIMOVIG 140 MG/ML SOAJ: 140 | 30 days supply | Qty: 1 | Fill #1

## 2019-05-24 MED FILL — NORETHINDRONE 0.35 MG TABS: 0.35 | 84 days supply | Qty: 84 | Fill #1

## 2019-06-11 ENCOUNTER — Encounter (HOSPITAL_BASED_OUTPATIENT_CLINIC_OR_DEPARTMENT_OTHER): Payer: Self-pay | Admitting: Surgery

## 2019-06-11 ENCOUNTER — Other Ambulatory Visit: Payer: Self-pay

## 2019-06-12 ENCOUNTER — Encounter (HOSPITAL_BASED_OUTPATIENT_CLINIC_OR_DEPARTMENT_OTHER)
Admission: RE | Admit: 2019-06-12 | Discharge: 2019-06-12 | Disposition: A | Discharge status: Home / Self Care | Payer: 59 | Source: Ambulatory Visit | Attending: Surgery | Admitting: Surgery

## 2019-06-12 DIAGNOSIS — Z01812 Encounter for preprocedural laboratory examination: Secondary | ICD-10-CM | POA: Insufficient documentation

## 2019-06-12 LAB — CBC WITH DIFFERENTIAL/PLATELET
Abs Immature Granulocytes: 0.02 10*3/uL (ref 0.00–0.07)
Basophils Absolute: 0 10*3/uL (ref 0.0–0.1)
Basophils Relative: 0 %
Eosinophils Absolute: 0 10*3/uL (ref 0.0–0.5)
Eosinophils Relative: 0 %
HCT: 45 % (ref 36.0–46.0)
Hemoglobin: 14.8 g/dL (ref 12.0–15.0)
Immature Granulocytes: 0 %
Lymphocytes Relative: 24 %
Lymphs Abs: 2.2 10*3/uL (ref 0.7–4.0)
MCH: 31.3 pg (ref 26.0–34.0)
MCHC: 32.9 g/dL (ref 30.0–36.0)
MCV: 95.1 fL (ref 80.0–100.0)
Monocytes Absolute: 0.6 10*3/uL (ref 0.1–1.0)
Monocytes Relative: 7 %
Neutro Abs: 6.6 10*3/uL (ref 1.7–7.7)
Neutrophils Relative %: 69 %
Platelets: 235 10*3/uL (ref 150–400)
RBC: 4.73 MIL/uL (ref 3.87–5.11)
RDW: 12.5 % (ref 11.5–15.5)
WBC: 9.5 10*3/uL (ref 4.0–10.5)
nRBC: 0 % (ref 0.0–0.2)

## 2019-06-12 LAB — COMPREHENSIVE METABOLIC PANEL
ALT: 13 U/L (ref 0–44)
AST: 14 U/L — ABNORMAL LOW (ref 15–41)
Albumin: 3.9 g/dL (ref 3.5–5.0)
Alkaline Phosphatase: 63 U/L (ref 38–126)
Anion gap: 10 (ref 5–15)
BUN: 9 mg/dL (ref 6–20)
CO2: 26 mmol/L (ref 22–32)
Calcium: 9.1 mg/dL (ref 8.9–10.3)
Chloride: 107 mmol/L (ref 98–111)
Creatinine, Ser: 0.82 mg/dL (ref 0.44–1.00)
GFR calc Af Amer: >60 mL/min (ref >60)
GFR calc non Af Amer: >60 mL/min (ref >60)
Glucose, Bld: 102 mg/dL — ABNORMAL HIGH (ref 70–99)
Potassium: 4.2 mmol/L (ref 3.5–5.1)
Sodium: 143 mmol/L (ref 135–145)
Total Bilirubin: 0.5 mg/dL (ref 0.3–1.2)
Total Protein: 7 g/dL (ref 6.5–8.1)

## 2019-06-12 LAB — POCT PREGNANCY, URINE: Preg Test, Ur: NEGATIVE

## 2019-06-17 ENCOUNTER — Other Ambulatory Visit (HOSPITAL_COMMUNITY): Payer: 59

## 2019-06-18 ENCOUNTER — Other Ambulatory Visit (HOSPITAL_COMMUNITY): Payer: 59

## 2019-06-18 ENCOUNTER — Other Ambulatory Visit (HOSPITAL_COMMUNITY)
Admission: RE | Admit: 2019-06-18 | Discharge: 2019-06-18 | Disposition: A | Discharge status: Home / Self Care | Payer: 59 | Source: Ambulatory Visit | Attending: Surgery | Admitting: Surgery

## 2019-06-18 DIAGNOSIS — Z20822 Contact with and (suspected) exposure to covid-19: Secondary | ICD-10-CM | POA: Insufficient documentation

## 2019-06-18 DIAGNOSIS — Z01812 Encounter for preprocedural laboratory examination: Secondary | ICD-10-CM | POA: Diagnosis not present

## 2019-06-18 LAB — SARS CORONAVIRUS 2 (TAT 6-24 HRS): SARS Coronavirus 2: NEGATIVE

## 2019-06-19 ENCOUNTER — Other Ambulatory Visit: Payer: Self-pay

## 2019-06-19 ENCOUNTER — Ambulatory Visit
Admission: RE | Admit: 2019-06-19 | Discharge: 2019-06-19 | Disposition: A | Discharge status: Home / Self Care | Payer: 59 | Source: Ambulatory Visit | Attending: Surgery | Admitting: Surgery

## 2019-06-19 DIAGNOSIS — D241 Benign neoplasm of right breast: Secondary | ICD-10-CM

## 2019-06-19 DIAGNOSIS — D369 Benign neoplasm, unspecified site: Secondary | ICD-10-CM

## 2019-06-19 DIAGNOSIS — D242 Benign neoplasm of left breast: Secondary | ICD-10-CM | POA: Diagnosis not present

## 2019-06-20 ENCOUNTER — Ambulatory Visit (HOSPITAL_BASED_OUTPATIENT_CLINIC_OR_DEPARTMENT_OTHER)
Admission: RE | Admit: 2019-06-20 | Discharge: 2019-06-20 | Disposition: A | Discharge status: Home / Self Care | Payer: 59 | Attending: Surgery | Admitting: Surgery

## 2019-06-20 ENCOUNTER — Ambulatory Visit
Admission: RE | Admit: 2019-06-20 | Discharge: 2019-06-20 | Disposition: A | Discharge status: Home / Self Care | Payer: 59 | Source: Ambulatory Visit | Attending: Surgery | Admitting: Surgery

## 2019-06-20 ENCOUNTER — Encounter (HOSPITAL_BASED_OUTPATIENT_CLINIC_OR_DEPARTMENT_OTHER)
Admission: RE | Disposition: A | Discharge status: Home / Self Care | Payer: Self-pay | Source: Home / Self Care | Attending: Surgery

## 2019-06-20 ENCOUNTER — Encounter (HOSPITAL_BASED_OUTPATIENT_CLINIC_OR_DEPARTMENT_OTHER): Payer: Self-pay | Admitting: Surgery

## 2019-06-20 ENCOUNTER — Other Ambulatory Visit: Payer: Self-pay

## 2019-06-20 ENCOUNTER — Ambulatory Visit (HOSPITAL_BASED_OUTPATIENT_CLINIC_OR_DEPARTMENT_OTHER): Payer: 59 | Admitting: Certified Registered Nurse Anesthetist

## 2019-06-20 DIAGNOSIS — D241 Benign neoplasm of right breast: Secondary | ICD-10-CM | POA: Insufficient documentation

## 2019-06-20 DIAGNOSIS — D242 Benign neoplasm of left breast: Secondary | ICD-10-CM

## 2019-06-20 DIAGNOSIS — F418 Other specified anxiety disorders: Secondary | ICD-10-CM | POA: Diagnosis not present

## 2019-06-20 DIAGNOSIS — Z803 Family history of malignant neoplasm of breast: Secondary | ICD-10-CM | POA: Diagnosis not present

## 2019-06-20 DIAGNOSIS — J45909 Unspecified asthma, uncomplicated: Secondary | ICD-10-CM | POA: Insufficient documentation

## 2019-06-20 DIAGNOSIS — D369 Benign neoplasm, unspecified site: Secondary | ICD-10-CM

## 2019-06-20 DIAGNOSIS — N6310 Unspecified lump in the right breast, unspecified quadrant: Secondary | ICD-10-CM | POA: Diagnosis present

## 2019-06-20 DIAGNOSIS — Z820 Family history of epilepsy and other diseases of the nervous system: Secondary | ICD-10-CM | POA: Diagnosis not present

## 2019-06-20 DIAGNOSIS — G43909 Migraine, unspecified, not intractable, without status migrainosus: Secondary | ICD-10-CM | POA: Insufficient documentation

## 2019-06-20 DIAGNOSIS — Z79810 Long term (current) use of selective estrogen receptor modulators (SERMs): Secondary | ICD-10-CM | POA: Diagnosis not present

## 2019-06-20 DIAGNOSIS — N6022 Fibroadenosis of left breast: Secondary | ICD-10-CM | POA: Diagnosis not present

## 2019-06-20 DIAGNOSIS — Z1231 Encounter for screening mammogram for malignant neoplasm of breast: Secondary | ICD-10-CM | POA: Diagnosis not present

## 2019-06-20 DIAGNOSIS — N6012 Diffuse cystic mastopathy of left breast: Secondary | ICD-10-CM | POA: Diagnosis not present

## 2019-06-20 DIAGNOSIS — N6489 Other specified disorders of breast: Secondary | ICD-10-CM | POA: Diagnosis not present

## 2019-06-20 HISTORY — DX: Anxiety disorder, unspecified: F41.9

## 2019-06-20 HISTORY — DX: Depression, unspecified: F32.A

## 2019-06-20 HISTORY — PX: BREAST LUMPECTOMY WITH RADIOACTIVE SEED LOCALIZATION: SHX6424

## 2019-06-20 SURGERY — BREAST LUMPECTOMY WITH RADIOACTIVE SEED LOCALIZATION
Anesthesia: General | Site: Breast | Laterality: Bilateral

## 2019-06-20 MED ORDER — CELECOXIB 200 MG PO CAPS
ORAL_CAPSULE | ORAL | Status: AC
  Filled 2019-06-20: qty 1

## 2019-06-20 MED ORDER — FENTANYL CITRATE (PF) 100 MCG/2ML IJ SOLN
INTRAMUSCULAR | Status: AC
  Filled 2019-06-20: qty 2

## 2019-06-20 MED ORDER — PROMETHAZINE HCL 25 MG/ML IJ SOLN
INTRAMUSCULAR | Status: AC
  Filled 2019-06-20: qty 1

## 2019-06-20 MED ORDER — HYDROCODONE-ACETAMINOPHEN 5-325 MG PO TABS: 1.0000 | ORAL_TABLET | Freq: Four times a day (QID) | ORAL | 0 refills | Status: DC | PRN

## 2019-06-20 MED ORDER — FENTANYL CITRATE (PF) 250 MCG/5ML IJ SOLN
INTRAMUSCULAR | Status: DC | PRN
  Administered 2019-06-20 (×4): 25 ug via INTRAVENOUS

## 2019-06-20 MED ORDER — ACETAMINOPHEN 500 MG PO TABS
1000.0000 mg | ORAL_TABLET | ORAL | Status: AC
  Administered 2019-06-20: 1000 mg via ORAL

## 2019-06-20 MED ORDER — LACTATED RINGERS IV SOLN: INTRAVENOUS | Status: DC

## 2019-06-20 MED ORDER — GABAPENTIN 300 MG PO CAPS
ORAL_CAPSULE | ORAL | Status: AC
  Filled 2019-06-20: qty 1

## 2019-06-20 MED ORDER — PROMETHAZINE HCL 25 MG/ML IJ SOLN
6.2500 mg | Freq: Once | INTRAMUSCULAR | Status: AC | PRN
  Administered 2019-06-20: 6.25 mg via INTRAVENOUS

## 2019-06-20 MED ORDER — ONDANSETRON HCL 4 MG/2ML IJ SOLN
INTRAMUSCULAR | Status: AC
  Filled 2019-06-20: qty 2

## 2019-06-20 MED ORDER — IBUPROFEN 800 MG PO TABS: 800.0000 mg | ORAL_TABLET | Freq: Three times a day (TID) | ORAL | 0 refills | Status: DC | PRN

## 2019-06-20 MED ORDER — OXYCODONE HCL 5 MG PO TABS
5.0000 mg | ORAL_TABLET | Freq: Once | ORAL | Status: AC | PRN
  Administered 2019-06-20: 5 mg via ORAL

## 2019-06-20 MED ORDER — PROPOFOL 10 MG/ML IV BOLUS
INTRAVENOUS | Status: DC | PRN
  Administered 2019-06-20: 200 mg via INTRAVENOUS

## 2019-06-20 MED ORDER — PHENYLEPHRINE HCL (PRESSORS) 10 MG/ML IV SOLN
INTRAVENOUS | Status: DC | PRN
  Administered 2019-06-20: 120 ug via INTRAVENOUS

## 2019-06-20 MED ORDER — PROPOFOL 10 MG/ML IV BOLUS
INTRAVENOUS | Status: AC
  Filled 2019-06-20: qty 20

## 2019-06-20 MED ORDER — CELECOXIB 200 MG PO CAPS
200.0000 mg | ORAL_CAPSULE | ORAL | Status: AC
  Administered 2019-06-20: 200 mg via ORAL

## 2019-06-20 MED ORDER — FENTANYL CITRATE (PF) 100 MCG/2ML IJ SOLN
25.0000 ug | INTRAMUSCULAR | Status: DC | PRN
  Administered 2019-06-20 (×3): 50 ug via INTRAVENOUS

## 2019-06-20 MED ORDER — LIDOCAINE HCL (CARDIAC) PF 100 MG/5ML IV SOSY
PREFILLED_SYRINGE | INTRAVENOUS | Status: DC | PRN
  Administered 2019-06-20: 80 mg via INTRATRACHEAL

## 2019-06-20 MED ORDER — DEXAMETHASONE SODIUM PHOSPHATE 10 MG/ML IJ SOLN
INTRAMUSCULAR | Status: DC | PRN
  Administered 2019-06-20: 5 mg via INTRAVENOUS

## 2019-06-20 MED ORDER — OXYCODONE HCL 5 MG PO TABS
ORAL_TABLET | ORAL | Status: AC
  Filled 2019-06-20: qty 1

## 2019-06-20 MED ORDER — ACETAMINOPHEN 500 MG PO TABS
ORAL_TABLET | ORAL | Status: AC
  Filled 2019-06-20: qty 2

## 2019-06-20 MED ORDER — ONDANSETRON HCL 4 MG/2ML IJ SOLN
INTRAMUSCULAR | Status: DC | PRN
  Administered 2019-06-20: 4 mg via INTRAVENOUS

## 2019-06-20 MED ORDER — DEXTROSE 5 % IV SOLN
3.0000 g | INTRAVENOUS | Status: AC
  Administered 2019-06-20: 2 g via INTRAVENOUS

## 2019-06-20 MED ORDER — PHENYLEPHRINE 40 MCG/ML (10ML) SYRINGE FOR IV PUSH (FOR BLOOD PRESSURE SUPPORT)
PREFILLED_SYRINGE | INTRAVENOUS | Status: AC
  Filled 2019-06-20: qty 10

## 2019-06-20 MED ORDER — GABAPENTIN 300 MG PO CAPS
300.0000 mg | ORAL_CAPSULE | ORAL | Status: AC
  Administered 2019-06-20: 300 mg via ORAL

## 2019-06-20 MED ORDER — BUPIVACAINE HCL (PF) 0.25 % IJ SOLN
INTRAMUSCULAR | Status: DC | PRN
  Administered 2019-06-20: 30 mL

## 2019-06-20 MED ORDER — CHLORHEXIDINE GLUCONATE CLOTH 2 % EX PADS: 6.0000 | MEDICATED_PAD | Freq: Once | CUTANEOUS | Status: DC

## 2019-06-20 MED ORDER — MIDAZOLAM HCL 2 MG/2ML IJ SOLN
INTRAMUSCULAR | Status: AC
  Filled 2019-06-20: qty 2

## 2019-06-20 MED ORDER — CEFAZOLIN SODIUM-DEXTROSE 2-4 GM/100ML-% IV SOLN
INTRAVENOUS | Status: AC
  Filled 2019-06-20: qty 100

## 2019-06-20 MED ORDER — DEXAMETHASONE SODIUM PHOSPHATE 10 MG/ML IJ SOLN
INTRAMUSCULAR | Status: AC
  Filled 2019-06-20: qty 1

## 2019-06-20 MED ORDER — LIDOCAINE 2% (20 MG/ML) 5 ML SYRINGE
INTRAMUSCULAR | Status: AC
  Filled 2019-06-20: qty 5

## 2019-06-20 MED ORDER — MIDAZOLAM HCL 2 MG/2ML IJ SOLN
INTRAMUSCULAR | Status: DC | PRN
  Administered 2019-06-20: 2 mg via INTRAVENOUS

## 2019-06-20 MED FILL — IBUPROFEN 800 MG TAB: 800 | 10 days supply | Qty: 30 | Fill #0

## 2019-06-20 MED FILL — HYDROCODON-APAP 5-325: 5-325 | 2 days supply | Qty: 10 | Fill #0

## 2019-06-20 SURGICAL SUPPLY — 48 items
APPLIER CLIP 9.375 MED OPEN (MISCELLANEOUS)
BINDER BREAST LRG (GAUZE/BANDAGES/DRESSINGS) IMPLANT
BINDER BREAST MEDIUM (GAUZE/BANDAGES/DRESSINGS) IMPLANT
BINDER BREAST XLRG (GAUZE/BANDAGES/DRESSINGS) IMPLANT
BINDER BREAST XXLRG (GAUZE/BANDAGES/DRESSINGS) IMPLANT
BLADE SURG 15 STRL LF DISP TIS (BLADE) ×1 IMPLANT
BLADE SURG 15 STRL SS (BLADE) ×3
CANISTER SUC SOCK COL 7IN (MISCELLANEOUS) IMPLANT
CANISTER SUCT 1200ML W/VALVE (MISCELLANEOUS) IMPLANT
CHLORAPREP W/TINT 26 (MISCELLANEOUS) ×3 IMPLANT
CLIP APPLIE 9.375 MED OPEN (MISCELLANEOUS) IMPLANT
COVER BACK TABLE 60X90IN (DRAPES) ×3 IMPLANT
COVER MAYO STAND STRL (DRAPES) ×3 IMPLANT
COVER PROBE W GEL 5X96 (DRAPES) ×3 IMPLANT
COVER WAND RF STERILE (DRAPES) IMPLANT
DECANTER SPIKE VIAL GLASS SM (MISCELLANEOUS) IMPLANT
DERMABOND ADVANCED (GAUZE/BANDAGES/DRESSINGS) ×2
DERMABOND ADVANCED .7 DNX12 (GAUZE/BANDAGES/DRESSINGS) ×1 IMPLANT
DRAPE LAPAROSCOPIC ABDOMINAL (DRAPES) IMPLANT
DRAPE LAPAROTOMY 100X72 PEDS (DRAPES) ×3 IMPLANT
DRAPE UTILITY XL STRL (DRAPES) ×3 IMPLANT
ELECT COATED BLADE 2.86 ST (ELECTRODE) ×3 IMPLANT
ELECT REM PT RETURN 9FT ADLT (ELECTROSURGICAL) ×3
ELECTRODE REM PT RTRN 9FT ADLT (ELECTROSURGICAL) ×1 IMPLANT
GLOVE BIOGEL PI IND STRL 8 (GLOVE) ×1 IMPLANT
GLOVE BIOGEL PI INDICATOR 8 (GLOVE) ×2
GLOVE ECLIPSE 8.0 STRL XLNG CF (GLOVE) ×3 IMPLANT
GOWN STRL REUS W/ TWL LRG LVL3 (GOWN DISPOSABLE) ×2 IMPLANT
GOWN STRL REUS W/TWL LRG LVL3 (GOWN DISPOSABLE) ×6
HEMOSTAT ARISTA ABSORB 3G PWDR (HEMOSTASIS) IMPLANT
HEMOSTAT SNOW SURGICEL 2X4 (HEMOSTASIS) IMPLANT
KIT MARKER MARGIN INK (KITS) ×3 IMPLANT
NDL HYPO 25X1 1.5 SAFETY (NEEDLE) ×1 IMPLANT
NEEDLE HYPO 25X1 1.5 SAFETY (NEEDLE) ×3 IMPLANT
NS IRRIG 1000ML POUR BTL (IV SOLUTION) ×3 IMPLANT
PACK BASIN DAY SURGERY FS (CUSTOM PROCEDURE TRAY) ×3 IMPLANT
PENCIL SMOKE EVACUATOR (MISCELLANEOUS) ×3 IMPLANT
SLEEVE SCD COMPRESS KNEE MED (MISCELLANEOUS) ×3 IMPLANT
SPONGE LAP 4X18 RFD (DISPOSABLE) ×3 IMPLANT
SUT MNCRL AB 4-0 PS2 18 (SUTURE) ×3 IMPLANT
SUT SILK 2 0 SH (SUTURE) IMPLANT
SUT VICRYL 3-0 CR8 SH (SUTURE) ×3 IMPLANT
SYR CONTROL 10ML LL (SYRINGE) ×3 IMPLANT
TOWEL GREEN STERILE FF (TOWEL DISPOSABLE) ×3 IMPLANT
TRAY FAXITRON CT DISP (TRAY / TRAY PROCEDURE) ×3 IMPLANT
TUBE CONNECTING 20'X1/4 (TUBING)
TUBE CONNECTING 20X1/4 (TUBING) IMPLANT
YANKAUER SUCT BULB TIP NO VENT (SUCTIONS) IMPLANT

## 2019-06-20 NOTE — Anesthesia Preprocedure Evaluation (Addendum)
Anesthesia Evaluation  Patient identified by MRN, date of birth, ID band Patient awake    Reviewed: Allergy & Precautions, NPO status , Patient's Chart, lab work & pertinent test results  Airway Mallampati: II  TM Distance: >3 FB     Dental   Pulmonary    breath sounds clear to auscultation       Cardiovascular negative cardio ROS   Rhythm:Regular Rate:Normal     Neuro/Psych    GI/Hepatic negative GI ROS, Neg liver ROS,   Endo/Other  negative endocrine ROS  Renal/GU negative Renal ROS     Musculoskeletal   Abdominal   Peds  Hematology   Anesthesia Other Findings   Reproductive/Obstetrics                             Anesthesia Physical Anesthesia Plan  ASA: II  Anesthesia Plan: General   Post-op Pain Management:    Induction: Intravenous  PONV Risk Score and Plan: 3 and Ondansetron, Dexamethasone and Midazolam  Airway Management Planned: LMA  Additional Equipment:   Intra-op Plan:   Post-operative Plan:   Informed Consent: I have reviewed the patients History and Physical, chart, labs and discussed the procedure including the risks, benefits and alternatives for the proposed anesthesia with the patient or authorized representative who has indicated his/her understanding and acceptance.     Dental advisory given  Plan Discussed with: CRNA and Anesthesiologist  Anesthesia Plan Comments:         Anesthesia Quick Evaluation

## 2019-06-20 NOTE — H&P (Signed)
Neta Ehlers  Documented:  Location: [facility]  Patient #: [id number]  DOB: 24-Jan-1986  Divorced / Language: Cleophus Molt / Race: White  Female  History of Present Illness ()  Patient words: Patient sent at the request of Dr.hu from the [facility] [place] secondary to magnetic resonance imaging findings of multiple breast masses. She had 2 areas in her right breast detected by magnetic resonance imaging. One was a fibroadenoma the second was a papilloma. The left side she had a fibroadenoma. She underwent magnetic resonance imaging secondary to family history of breast cancer and was felt to be at elevated risk of developing breast cancer. She is now on tamoxifen followed by Dr. Jana Hakim. Her mother also had breast cancer in her 21s which prompted the magnetic resonance imaging. She is sore from the magnetic resonance imaging and core biopsy otherwise has no other complaints. No history of nipple discharge, breast mass or breast pain.  CLINICAL DATA: 34 year old female returns for possible biopsy of a  1.3 cm mass within the UPPER-OUTER RIGHT breast, anterior to middle  depth. Two RIGHT breast masses were identified on recent ultrasound  which were biopsied demonstrating a fibroadenoma and papilloma. It  was uncertain whether the UPPER-OUTER RIGHT breast mass identified  on 02/22/2019 MR represents 1 of the masses biopsied under  ultrasound guidance.  LABS: None performed today  EXAM:  MR OF THE RIGHT BREAST WITH AND WITHOUT CONTRAST  TECHNIQUE:  Multiplanar, multisequence MR images of the right breast were  obtained prior to and following the intravenous administration of 7  ml of Gadavist.  Three-dimensional MR images were rendered by post-processing of the  original MR data on an independent workstation. The  three-dimensional MR images were interpreted, and findings are  reported in the following complete MRI report for this study. Three  dimensional images were evaluated at  the independent DynaCad  workstation  COMPARISON: Previous exam(s).  FINDINGS:  Breast composition: b. Scattered fibroglandular tissue.  Background parenchymal enhancement: Mild  Right breast: The 1.1 cm UPPER-OUTER RIGHT breast mass identified on  02/22/2019 MR contains biopsy clip artifact and compatible with  biopsy-proven fibroadenoma.  The 1.3 cm UPPER RIGHT breast mass identified at the 12-1 o'clock  axis on 02/22/2019 MR contains biopsy clip artifact and compatible  with biopsy-proven papilloma.  No other abnormal or suspicious areas of enhancement are identified.  Lymph nodes: No abnormal appearing lymph nodes.  Ancillary findings: None.  IMPRESSION:  1. The 2 enhancing RIGHT breast masses identified on 02/22/2019 MR  contains biopsy clip artifact and represents the 2 RIGHT breast  masses that were identified by ultrasound and biopsied (fibroadenoma  and papilloma).  As the UPPER-OUTER RIGHT breast mass has been sampled  sonographically, MR guided biopsy was canceled.  RECOMMENDATION:  Patient has an existing appointment with Dr. Brantley Stage for the RIGHT  breast papilloma.  BI-RADS CATEGORY 2: Benign.  Electronically Signed  By: Margarette Canada M.D.  On: 04/05/2019 11:35  Diagnosis  1. Breast, right, needle core biopsy, 12 o'clock, 3cmfn, ribbon  - FIBROADENOMA.  - NO MALIGNANCY IDENTIFIED.  2. Breast, right, needle core biopsy, 12 o'clock, 2cmfn, heart  - INTRADUCTAL PAPILLOMA.  - NO ATYPIA OR MALIGNANCY.  3. Breast, left, needle core biopsy, 4 o'clock, 4cmfn, ribbon  - FIBROADENOMA.  - NO MALIGNANCY IDENTIFIED.  The patient is a 34 year old female.  Past Surgical History Lindwood Coke, RN; 04/26/2019 9:15 AM)  Oral Surgery  Diagnostic Studies History Lindwood Coke, RN; 04/26/2019 9:15  AM)  Colonoscopy never  Mammogram within last year  Pap Smear 1-5 years ago  Allergies Lindwood Coke, RN; 04/26/2019 9:16 AM)  Latex Gloves *MEDICAL DEVICES AND SUPPLIES* Hives, Rash,  Pruritus.  Medication History (Diane Herrin, RN; 04/26/2019 9:20 AM)  Aimovig (140MG/ML Soln Auto-inj, Subcutaneous) Active.  Cyclobenzaprine HCl (10MG Tablet, Oral) Active.  Etonogestrel-Ethinyl Estradiol (0.12-0.015MG/24HR Ring, Vaginal) Active.  Ondansetron HCl (4MG Tablet, Oral) Active.  Spironolactone (50MG Tablet, Oral) Active.  Symbicort (80-4.5MCG/ACT Aerosol, Inhalation) Active.  Trokendi XR (200MG Capsule ER 24HR, Oral) Active.  Nurtec (75MG Tablet Disint, Oral) Active.  Amerge (2.5MG Tablet, Oral) Active.  Lexapro (20MG Tablet, Oral) Active.  EpinephrineSnap-EMS (1MG/ML Kit, Injection) Active.  Fioricet (50-300-40MG Capsule, Oral) Active.  Albuterol (90MCG/ACT Aerosol Soln, Inhalation) Active.  Medications Reconciled  Social History Lindwood Coke, RN; 04/26/2019 9:15 AM)  Alcohol use Occasional alcohol use.  Caffeine use Coffee.  No drug use  Tobacco use Never smoker.  Family History Breast Cancer Mother.  Migraine Headache Mother, Sister.  Pregnancy / Birth History Lindwood Coke, RN; 04/26/2019 9:15 AM)  Age at menarche 82 years.  Contraceptive History Oral contraceptives.  Gravida 2  Irregular periods  Maternal age 35-25  Para 2  Other Problems Asthma  Heart murmur  Lump In Breast  Migraine Headache  Review of Systems ( General Not Present- Appetite Loss, Chills, Fatigue, Fever, Night Sweats, Weight Gain and Weight Loss.  Skin Not Present- Change in Wart/Mole, Dryness, Hives, Jaundice, New Lesions, Non-Healing Wounds, Rash and Ulcer.  HEENT Not Present- Earache, Hearing Loss, Hoarseness, Nose Bleed, Oral Ulcers, Ringing in the Ears, Seasonal Allergies, Sinus Pain, Sore Throat, Visual Disturbances, Wears glasses/contact lenses and Yellow Eyes.  Respiratory Not Present- Bloody sputum, Chronic Cough, Difficulty Breathing, Snoring and Wheezing.  Breast Present- Breast Mass and Breast Pain. Not Present- Nipple Discharge and Skin Changes.  Cardiovascular Not Present- Chest  Pain, Difficulty Breathing Lying Down, Leg Cramps, Palpitations, Rapid Heart Rate, Shortness of Breath and Swelling of Extremities.  Gastrointestinal Not Present- Abdominal Pain, Bloating, Bloody Stool, Change in Bowel Habits, Chronic diarrhea, Constipation, Difficulty Swallowing, Excessive gas, Gets full quickly at meals, Hemorrhoids, Indigestion, Nausea, Rectal Pain and Vomiting.  Female Genitourinary Not Present- Frequency, Nocturia, Painful Urination, Pelvic Pain and Urgency.  Musculoskeletal Not Present- Back Pain, Joint Pain, Joint Stiffness, Muscle Pain, Muscle Weakness and Swelling of Extremities.  Neurological Present- Headaches. Not Present- Decreased Memory, Fainting, Numbness, Seizures, Tingling, Tremor, Trouble walking and Weakness.  Psychiatric Not Present- Anxiety, Bipolar, Change in Sleep Pattern, Depression, Fearful and Frequent crying.  Endocrine Not Present- Cold Intolerance, Excessive Hunger, Hair Changes, Heat Intolerance, Hot flashes and New Diabetes.  Hematology Not Present- Blood Thinners, Easy Bruising, Excessive bleeding, Gland problems, HIV and Persistent Infections.  Vitals  04/26/2019 9:20 AM  Weight: 165.25 lb Height: 62 in  Body Surface Area: 1.76 m Body Mass Index: 30.22 kg/m  Temp.: 98.2 F Pulse: 126 (Regular) P.OX: 99% (Room air)  BP: 126/78 (Sitting, Left Arm, Standard)  Physical Exam (Thomas A. Cornett MD; 04/26/2019 1:09 PM)  Head and Neck  Head - normocephalic, atraumatic with no lesions or palpable masses.  Trachea - midline.  Thyroid  Gland Characteristics - normal size and consistency.  Chest and Lung Exam  Chest and lung exam reveals - normal excursion with symmetric chest walls and quiet, even and easy respiratory effort with no use of accessory muscles.  Breast  Note: Bruising noted to bilateral breasts. No masses in either breast. No evidence of nipple discharge bilaterally.  Cardiovascular  Inspection  Jugular vein - Left - Inspection Normal.  Jugular vein - Right - Inspection Normal. BP In 2+ Extremities - Indicated and See Vitals Section for details.  Neurologic  Neurologic evaluation reveals - alert and oriented x 3 with no impairment of recent or remote memory.  Mental Status - Normal.  Lymphatic  Head & Neck  General Head & Neck Lymphatics: Bilateral - Description - Normal.  Axillary  General Axillary Region: Bilateral - Description - Normal. Tenderness - Non Tender.  Assessment & Plan (Verdean Murin A. 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 Discussed observation as well. Risk of lumpectomy include bleeding, infection, seroma, more surgery, use of seed/wire, wound care, cosmetic deformity and the need for other treatments, death , blood clots, death. Pt agrees to proceed.  total time 30 minutes  PAPILLOMA OF BREAST (D24.9)  Impression: right  Discuss potential operative risk of 20%. Patient desires lumpectomy. Risk of lumpectomy include bleeding, infection, seroma, more surgery, use of seed/wire, wound care, cosmetic deformity and the need for other treatments, death , blood clots, death. Pt agrees to proceed.  Current Plans  You are being scheduled for surgery - Our schedulers will call you.  You should hear from our office's scheduling department within 5 working days about the location, date, and time of surgery. We try to make accommodations for patient's preferences in scheduling surgery, but sometimes the OR schedule or the surgeon's schedule prevents [place] from making those accommodations.  If you have not heard from our office [phone]) in 5 working days, call the office and ask for your surgeon's nurse.  If you have other questions about your diagnosis, plan, or surgery, call the office and ask for your surgeon's nurse.  Pt Education - [facility] Breast Biopsy HCI: discussed with patient and provided information

## 2019-06-20 NOTE — Anesthesia Postprocedure Evaluation (Signed)
Anesthesia Post Note  Patient: Michele Mcbride  Procedure(s) Performed: BILATERAL BREAST LUMPECTOMY WITH RADIOACTIVE SEED LOCALIZATION, Right x 2 and Left x1 (Bilateral Breast)     Patient location during evaluation: PACU Anesthesia Type: General Level of consciousness: awake Pain management: pain level controlled Vital Signs Assessment: post-procedure vital signs reviewed and stable Respiratory status: spontaneous breathing Cardiovascular status: stable Postop Assessment: no apparent nausea or vomiting Anesthetic complications: no    Last Vitals:  Vitals:   06/20/19 1215 06/20/19 1230  BP: 130/69 130/71  Pulse: 89 85  Resp: 18 16  Temp:    SpO2: 91% 100%    Last Pain:  Vitals:   06/20/19 1228  TempSrc:   PainSc: 7                  Gloriajean Okun

## 2019-06-20 NOTE — Discharge Instructions (Signed)
No Tylenol or NSAIDs (Advil/Ibuprofen, etc.) before 3:45pm today.  [place] Office Phone Number [phone]  BREAST BIOPSY/ PARTIAL MASTECTOMY: POST OP INSTRUCTIONS  Always review your discharge instruction sheet given to you by the facility where your surgery was performed.  IF YOU HAVE DISABILITY OR FAMILY LEAVE FORMS, YOU MUST BRING THEM TO THE OFFICE FOR PROCESSING.  DO NOT GIVE THEM TO YOUR DOCTOR.  1. A prescription for pain medication may be given to you upon discharge.  Take your pain medication as prescribed, if needed.  If narcotic pain medicine is not needed, then you may take acetaminophen (Tylenol) or ibuprofen (Advil) as needed. 2. Take your usually prescribed medications unless otherwise directed 3. If you need a refill on your pain medication, please contact your pharmacy.  They will contact our office to request authorization.  Prescriptions will not be filled after 5pm or on week-ends. 4. You should eat very light the first 24 hours after surgery, such as soup, crackers, pudding, etc.  Resume your normal diet the day after surgery. 5. Most patients will experience some swelling and bruising in the breast.  Ice packs and a good support bra will help.  Swelling and bruising can take several days to resolve.  6. It is common to experience some constipation if taking pain medication after surgery.  Increasing fluid intake and taking a stool softener will usually help or prevent this problem from occurring.  A mild laxative (Milk of Magnesia or Miralax) should be taken according to package directions if there are no bowel movements after 48 hours. 7. Unless discharge instructions indicate otherwise, you may remove your bandages 24-48 hours after surgery, and you may shower at that time.  You may have steri-strips (small skin tapes) in place directly over the incision.  These strips should be left on the skin for 7-10 days.  If your surgeon used skin glue on the  incision, you may shower in 24 hours.  The glue will flake off over the next 2-3 weeks.  Any sutures or staples will be removed at the office during your follow-up visit. 8. ACTIVITIES:  You may resume regular daily activities (gradually increasing) beginning the next day.  Wearing a good support bra or sports bra minimizes pain and swelling.  You may have sexual intercourse when it is comfortable. a. You may drive when you no longer are taking prescription pain medication, you can comfortably wear a seatbelt, and you can safely maneuver your car and apply brakes. b. RETURN TO WORK:  ______________________________________________________________________________________ 9. You should see your doctor in the office for a follow-up appointment approximately two weeks after your surgery.  Your doctor's nurse will typically make your follow-up appointment when she calls you with your pathology report.  Expect your pathology report 2-3 business days after your surgery.  You may call to check if you do not hear from [place] after three days. 10. OTHER INSTRUCTIONS: _______________________________________________________________________________________________ _____________________________________________________________________________________________________________________________________ _____________________________________________________________________________________________________________________________________ _____________________________________________________________________________________________________________________________________  WHEN TO CALL YOUR DOCTOR: 1. Fever over 101.0 2. Nausea and/or vomiting. 3. Extreme swelling or bruising. 4. Continued bleeding from incision. 5. Increased pain, redness, or drainage from the incision.  The clinic staff is available to answer your questions during regular business hours.  Please don't hesitate to call and ask to speak to one of the nurses for  clinical concerns.  If you have a medical emergency, go to the nearest emergency room or call 911.  A surgeon from [facility] is always on call at  the hospital.  For further questions, please visit [email or web address]

## 2019-06-20 NOTE — Anesthesia Procedure Notes (Signed)
Procedure Name: LMA Insertion Date/Time: 06/20/2019 10:21 AM Performed by: Kathryne Hitch, CRNA Pre-anesthesia Checklist: Patient identified, Emergency Drugs available, Suction available and Patient being monitored Patient Re-evaluated:Patient Re-evaluated prior to induction Oxygen Delivery Method: Circle system utilized Preoxygenation: Pre-oxygenation with 100% oxygen Induction Type: IV induction Ventilation: Mask ventilation without difficulty LMA: LMA inserted LMA Size: 4.0 Number of attempts: 1 Placement Confirmation: positive ETCO2 Tube secured with: Tape Dental Injury: Teeth and Oropharynx as per pre-operative assessment

## 2019-06-20 NOTE — Transfer of Care (Signed)
Immediate Anesthesia Transfer of Care Note  Patient: Michele Mcbride  Procedure(s) Performed: BILATERAL BREAST LUMPECTOMY WITH RADIOACTIVE SEED LOCALIZATION, Right x 2 and Left x1 (Bilateral Breast)  Patient Location: PACU  Anesthesia Type:General  Level of Consciousness: drowsy and patient cooperative  Airway & Oxygen Therapy: Patient Spontanous Breathing and Patient connected to face mask oxygen  Post-op Assessment: Report given to RN and Post -op Vital signs reviewed and stable  Post vital signs: Reviewed and stable  Last Vitals:  Vitals Value Taken Time  BP 135/77 06/20/19 1124  Temp    Pulse 79 06/20/19 1126  Resp 16 06/20/19 1126  SpO2 100 % 06/20/19 1126  Vitals shown include unvalidated device data.  Last Pain:  Vitals:   06/20/19 0936  TempSrc: Temporal  PainSc: 2       Patients Stated Pain Goal: 1 ([id number])  Complications: No apparent anesthesia complications

## 2019-06-20 NOTE — Op Note (Signed)
Preoperative diagnosis: Right breast fibroadenoma right upper outer quadrant and right breast papilloma central left breast fibroadenoma left lower outer quadrant  Postoperative diagnosis: Same  Procedure: Right breast seed localized lumpectomy x2 and left seed localized lumpectomy  Surgeon: Erroll Luna, MD  Anesthesia: General with 0.25% Marcaine local plain  EBL: Minimal  Specimen: Left breast tissue with seed and clip verified by Faxitron and right breast tissue containing both seeds and both clips verified by Faxitron  IV fluids: Per anesthesia record  Drains: None  Indications for procedure: The patient presents for bilateral lumpectomy after work-up revealed multiple bilateral breast masses.  She has a strong family history of breast cancer and is a high risk patient.  She opted for lumpectomy due to her past history and the potential upgrade risk of a papilloma.  We discussed removing the papilloma only but she wished to have the fibroadenomas removed as well given her complex imaging and dense breast tissue.The procedure has been discussed with the patient. Alternatives to surgery have been discussed with the patient.  Risks of surgery include bleeding,  Infection, cosmetic   deformity seroma formation, death,  and the need for further surgery.   The patient understands and wishes to proceed.  Description of procedure: The patient was met in the holding area and questions were answered.  The neoprobe was used to identify both seeds in the right breast and the single seed in the left breast.  Questions were answered.  She is taken back to the operating room.  She is placed supine upon the OR table.  After induction of general anesthesia, both breasts were prepped and draped in sterile fashion.  Timeout was performed.  The left breast was done first.  The neoprobe was used and the hotspot identified left lower outer quadrant.  Curvilinear incision made along the inferior mammary fold of  the left breast.  Dissection was carried down all tissue and the seed and clip were excised a grossly negative margin.  Hemostasis was achieved.  Faxitron image revealed both seed and clip to be present.  We then infiltrated the lumpectomy cavity for local anesthetic and closed with 3-0 Vicryl and 4-0 Monocryl.  Attention was turned to the right breast.  Neoprobe was used in both spots were identified in the right breast upper outer quadrant.  1 was more superior than the other.  Local anesthetic was infiltrated along the medial border of the nipple areolar complex.  Curvilinear incision was made along the edge of the nipple areolar complex.  Dissection was carried down all tissue around both seeds and both clips were excised with a grossly negative margin.  Hemostasis achieved.  Faxitron revealed both seeds and both clips to be present in the specimen.  Local anesthetic was infiltrated.  Wound closed with combination of 3-0 Vicryl and 4-0 Monocryl.  Dermabond applied.  All final counts found to be correct sponge, needles and instruments.  The patient was awoke extubated taken to recovery in satisfactory condition.

## 2019-06-20 NOTE — Interval H&P Note (Signed)
History and Physical Interval Note:  06/20/2019 9:51 AM  Michele Mcbride  has presented today for surgery, with the diagnosis of FIBROADENOMA, PAPILLOMA.  The various methods of treatment have been discussed with the patient and family. After consideration of risks, benefits and other options for treatment, the patient has consented to  Procedure(s): BILATERAL BREAST LUMPECTOMY WITH RADIOACTIVE SEED LOCALIZATION, Right x 2 and Left x1 (Bilateral) as a surgical intervention.  The patient's history has been reviewed, patient examined, no change in status, stable for surgery.  I have reviewed the patient's chart and labs.  Questions were answered to the patient's satisfaction.     [place]

## 2019-06-21 ENCOUNTER — Encounter: Payer: Self-pay | Admitting: *Deleted

## 2019-06-24 LAB — SURGICAL PATHOLOGY

## 2019-06-24 MED FILL — HYDROCODON-APAP 5-325: 5-325 | 2 days supply | Qty: 10 | Fill #0

## 2019-07-24 ENCOUNTER — Other Ambulatory Visit: Payer: Self-pay

## 2019-07-24 ENCOUNTER — Encounter: Payer: Self-pay | Admitting: Family Medicine

## 2019-07-24 ENCOUNTER — Encounter: Payer: Self-pay | Admitting: Physician Assistant

## 2019-07-24 ENCOUNTER — Telehealth (INDEPENDENT_AMBULATORY_CARE_PROVIDER_SITE_OTHER): Payer: 59 | Admitting: Physician Assistant

## 2019-07-24 VITALS — Temp 100.9°F

## 2019-07-24 DIAGNOSIS — J4541 Moderate persistent asthma with (acute) exacerbation: Secondary | ICD-10-CM | POA: Diagnosis not present

## 2019-07-24 DIAGNOSIS — J069 Acute upper respiratory infection, unspecified: Secondary | ICD-10-CM | POA: Diagnosis not present

## 2019-07-24 MED ORDER — BENZONATATE 100 MG PO CAPS: 100.0000 mg | ORAL_CAPSULE | Freq: Three times a day (TID) | ORAL | 0 refills | Status: DC | PRN

## 2019-07-24 MED ORDER — PREDNISONE 20 MG PO TABS: 40.0000 mg | ORAL_TABLET | Freq: Every day | ORAL | 0 refills | Status: DC

## 2019-07-24 NOTE — Progress Notes (Signed)
Virtual Visit via Video   I connected with patient on 07/24/19 at  3:30 PM EDT by a video enabled telemedicine application and verified that I am speaking with the correct person using two identifiers.  Location patient: Home Location provider: [facility], Office Persons participating in the virtual visit: Patient, Provider, [place] (Patina Moore)  I discussed the limitations of evaluation and management by telemedicine and the availability of in person appointments. The patient expressed understanding and agreed to proceed.  Subjective:   HPI:   Patient presents via [facility] today complaining of dry cough, chest tightness, wheezing, headache, nasal congestion with rhinorrhea, sinus pressure and low-grade fever noted on waking this morning.  T-max 100.9.  Patient denies any recent travel or sick contact.  Has history of both moderate persistent asthma and allergy, endorsing taking all medications as directed.  Denies sinus pain, facial pain or tooth pain.  Denies significant chest congestion.  Denies chest pain.  Denies loss of taste or smell, rash or GI symptoms.  Patient has already received her first Covid vaccine.  Patient notes she contacted health at work this morning who sent her for Covid testing.  Patient states she just received the results and these are negative.  Has been using her chronic inhalers, Tylenol and regular allergy medications for symptom control.  States cough is the worst symptom currently.   ROS:   See pertinent positives and negatives per HPI.  Patient Active Problem List   Diagnosis Date Noted  . Breast cancer screening, high risk patient 01/14/2019  . At high risk for breast cancer 01/14/2019  . Colon cancer high risk 01/14/2019  . Monoallelic mutation of CHEK2 gene in female patient   . Genetic testing 12/10/2018  . Family history of breast cancer   . Chronic migraine without aura, with intractable migraine, so stated, with status migrainosus  08/14/2017  . Insomnia 10/20/2016  . Physical exam 04/29/2016  . 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 Acute pharyngitis 09/27/2013  . Anxiety and depression 09/19/2012  . Sinusitis 03/09/2012  . PALPITATIONS 01/27/2010  . Migraine 12/19/2007  . ALLERGIC RHINITIS 07/06/2007    Social History   Tobacco Use  . Smoking status: Never Smoker  . Smokeless tobacco: Never Used  Substance Use Topics  . Alcohol use: No    Current Outpatient Medications:  .  albuterol (PROVENTIL HFA;VENTOLIN HFA) 108 (90 Base) MCG/ACT inhaler, Inhale into the lungs every 6 (six) hours as needed for wheezing or shortness of breath., Disp: , Rfl:  .  butalbital-acetaminophen-caffeine (FIORICET) 50-325-40 MG tablet, Take 1 tablet by mouth every 6 (six) hours as needed for headache., Disp: 30 tablet, Rfl: 2 .  cyclobenzaprine (FLEXERIL) 10 MG tablet, TAKE 1 TABLET BY MOUTH 3 TIMES DAILY AS NEEDED FOR MUSCLE SPASMS., Disp: 90 tablet, Rfl: 0 .  desloratadine (CLARINEX) 5 MG tablet, Take 5 mg by mouth daily., Disp: , Rfl:  .  EPINEPHrine (AUVI-Q) 0.3 mg/0.3 mL IJ SOAJ injection, Inject 0.3 mg into the muscle once., Disp: , Rfl:  .  Erenumab-aooe (AIMOVIG) 140 MG/ML SOAJ, Inject 140 mg into the skin every 30 (thirty) days., Disp: 1 mL, Rfl: 5 .  escitalopram (LEXAPRO) 20 MG tablet, Take 1 tablet (20 mg total) by mouth daily., Disp: 90 tablet, Rfl: 4 .  HYDROcodone-acetaminophen (NORCO/VICODIN) 5-325 MG tablet, Take 1 tablet by mouth every 6 (six) hours as needed for moderate pain., Disp: 10 tablet, Rfl: 0 .  ibuprofen (  ADVIL) 800 MG tablet, Take 1 tablet (800 mg total) by mouth every 8 (eight) hours as needed., Disp: 30 tablet, Rfl: 0 .  naratriptan (AMERGE) 2.5 MG tablet, Take 1 tablet (2.5 mg total) by mouth as needed for migraine. may repeat after 2 hours., Disp: 10 tablet, Rfl: 11 .  norethindrone (MICRONOR) 0.35 MG tablet, Take by mouth., Disp: , Rfl:  .   ondansetron (ZOFRAN) 4 MG tablet, Take 1 tablet (4 mg total) by mouth every 8 (eight) hours as needed for nausea or vomiting., Disp: 30 tablet, Rfl: 11 .  QVAR REDIHALER 80 MCG/ACT inhaler, Inhale 2 puffs into the lungs 2 (two) times daily. , Disp: , Rfl: 5 .  Rimegepant Sulfate (NURTEC) 75 MG TBDP, Take 75 mg by mouth daily as needed. For migraines. Take as close to onset of migraine as possible. One daily maximum., Disp: 10 tablet, Rfl: 6 .  spironolactone (ALDACTONE) 50 MG tablet, Take 50 mg by mouth 2 (two) times daily. , Disp: , Rfl: 3 .  SYMBICORT 80-4.5 MCG/ACT inhaler, Inhale 2 puffs into the lungs 2 (two) times daily., Disp: , Rfl:  .  TROKENDI XR 200 MG CP24, TAKE 1 CAPSULE BY MOUTH AT BEDTIME., Disp: 30 capsule, Rfl: 11  Allergies  Allergen Reactions  . Strawberry Extract Anaphylaxis  . Latex Rash    Rash and sores    Objective:   Temp (!) 100.9 F (38.3 C) (Oral)   Patient is well-developed, well-nourished in no acute distress.  Resting comfortably at home.  Head is normocephalic, atraumatic.  No labored breathing.  Speech is clear and coherent with logical content.  Patient is alert and oriented at baseline.   Assessment and Plan:   1. Viral URI with cough Negative rapid Covid test this morning.  Encouraged her to schedule a PCR test which is more accurate given concern for Covid.  This could also be just run-of-the-mill viral infection causing an exacerbation in her chronic asthma.  We will have her keep well-hydrated and get plenty of rest.  Start vitamin regimen including vitamin D, vitamin C and zinc.  Rx Tessalon to help with cough.  Mucinex DM over-the-counter.  Will Rx prednisone burst due to her history of asthma and current exacerbation.  Strict ER precautions reviewed with patient.  She is to quarantine until her results come back. - benzonatate (TESSALON) 100 MG capsule; Take 1 capsule (100 mg total) by mouth 3 (three) times daily as needed for cough.  Dispense:  30 capsule; Refill: 0 - predniSONE (DELTASONE) 20 MG tablet; Take 2 tablets (40 mg total) by mouth daily with breakfast.  Dispense: 10 tablet; Refill: 0  2. Moderate persistent asthma with exacerbation Patient to continue chronic asthma medications daily.  5-day 40 mg prednisone burst added for current exacerbation.  Tessalon for cough.  Strict ER precautions reviewed with patient. - benzonatate (TESSALON) 100 MG capsule; Take 1 capsule (100 mg total) by mouth 3 (three) times daily as needed for cough.  Dispense: 30 capsule; Refill: 0 - predniSONE (DELTASONE) 20 MG tablet; Take 2 tablets (40 mg total) by mouth daily with breakfast.  Dispense: 10 tablet; Refill: 0  Patient voiced understanding and agreement with the plan.  Written copy of instructions were sent to patient's MyChart.  Work note written.  Leeanne Rio, PA-C 07/24/2019

## 2019-07-24 NOTE — Patient Instructions (Signed)
Instructions sent to MyChart

## 2019-07-24 NOTE — Progress Notes (Signed)
I have discussed the procedure for the virtual visit with the patient who has given consent to proceed with assessment and treatment.   Cristal Howatt S Tyhir Schwan, CMA     

## 2019-07-25 ENCOUNTER — Encounter: Payer: Self-pay | Admitting: Physician Assistant

## 2019-07-29 NOTE — Progress Notes (Signed)
[place]  [phone] [phone]) [phone]     ID: Michele Mcbride DOB: [birth year]  MR#: [id number]  TMH#:[id number]  Patient Care Team: Midge Minium, MD as PCP - General (Family Medicine) Lahoma Crocker, MD as Consulting Physician (Obstetrics and Gynecology) Smantha Boakye, Virgie Dad, MD as Consulting Physician (Oncology) Harold Hedge, Darrick Grinder, MD as Consulting Physician (Allergy and Immunology) Melvenia Beam, MD as Consulting Physician (Neurology) Lahoma Crocker, MD as Consulting Physician (Obstetrics and Gynecology) Eyvonne Mechanic as Counselor (Licensed Clinical Social Worker) Chauncey Cruel, MD OTHER MD:  CHIEF COMPLAINT: high risk for breast cancer  CURRENT TREATMENT: Intensified screening.  Considering tamoxifen   INTERVAL HISTORY: Michele Mcbride DID NOT SHOW FOR HER 07/30/2019 VISIT  Since consultation, she underwent breast MRI on 02/22/2019 showing: breast composition B; indeterminate 1.3 cm irregular enhancing mass within right breast at 12-1 o'clock; additional indeterminate 1.1 cm enhancing mass within upper-outer right breast; no evidence of left breast malignancy.  She also underwent bilateral diagnostic mammography with tomography and bilateral ultrasonography at [facility] on 03/15/2019 showing: breast density category B; indeterminate right breast mass at 12 o'clock, possibly corresponding to MRI finding but not definitely; second indeterminate right breast mass likely correlating to smaller MRI finding; right breast mass deep within tissues at 12 o'clock, favored to represent a cyst; indeterminate left breast mass at 4 o'clock, possibly representing a fibroadenoma; no evidence of lymphadenopathy bilaterally.  She proceeded to biopsies on 03/20/2019 of the two right and one left breast areas in question. Pathology from the procedure ([id number]) showed: 1. 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 Right Breast, 2 cm from  nipple  - intraductal papilloma 3. Left Breast  - fibroadenoma  She opted to proceed with bilateral lumpectomies on 06/20/2019 under Dr. Brantley Stage. Pathology from the procedure ([id number]) showed: 1. Left Breast  - fibroadenoma  - fibrocystic changes with calcifications 2. 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 She learned about the genetics counseling option here and met with our counselor.  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   Her subsequent history is detailed below   PAST MEDICAL HISTORY: Past Medical History:  Diagnosis Date  . Anxiety   . Asthma   . Depression   . Family history of breast cancer   . Migraine   . Monoallelic mutation of CHEK2 gene in female patient   . PCOS (polycystic ovarian syndrome)   . Sinus infection     PAST SURGICAL HISTORY: Past Surgical History:  Procedure Laterality Date  . BREAST LUMPECTOMY WITH RADIOACTIVE SEED LOCALIZATION Bilateral 06/20/2019   Procedure: BILATERAL BREAST LUMPECTOMY WITH RADIOACTIVE SEED LOCALIZATION, Right x 2 and Left x1;  Surgeon: Erroll Luna, MD;  Location: [facility];  Service: General;  Laterality: Bilateral;  . WISDOM TOOTH EXTRACTION      FAMILY HISTORY: Family History  Problem Relation Age of Onset  . Hyperlipidemia Mother   . Neuropathy Mother   . Migraines Mother   . Breast cancer Mother 39  . Migraines Other        sisters, aunts, and grandmother had migraines on mother's side  . Breast cancer Paternal Aunt    Patient has limited info about her father and that  side of the family.  Her father is in his late 46s as of November 2020.  Patient's mother is 73 years old as of 11/2018. She underwent a breast lumpectomy at age 52.  She tells me she had no radiation,  no antiestrogens, and no chemotherapy.  3 maternal aunts and 3 maternal uncles have no history of cancer to the patient's knowledge.  She does not have complete information on them however.  Maternal grandmother died at 58 and grandfather at 34, neither from cancer causes.  The patient denies a family hx of ovarian cancer. She has 2 half-siblings. Brother passed away in 12-Aug-2018.   GYNECOLOGIC HISTORY:  No LMP recorded. (Menstrual status: Other). Menarche: 34 years old Age at first live birth: 34 years old [place] P 2 Contraceptive: Nuva ring HRT n/a  Hysterectomy? no BSO? 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 Michele Mcbride graduated from [employer] 's nursing program. She is divorced. She lives at home with daughter Michele Mcbride, 1 years old, and son Michele Mcbride 35 years old. The patient's (ex)mother in law cares for them while the patient works.  The patient is not a church attender    ADVANCED DIRECTIVES: Not in place.  At the 01/14/2019 visit the patient was given the appropriate documents to complete a notarize at her discretion.   HEALTH MAINTENANCE: Social History   Tobacco Use  . Smoking status: Never Smoker  . Smokeless tobacco: Never Used  Substance Use Topics  . Alcohol use: No  . Drug use: No     Colonoscopy: n/a  PAP: 10/2018, negative, suspected false-negative  Bone density: n/a   Allergies  Allergen Reactions  . Strawberry Extract Anaphylaxis  . Latex Rash    Rash and sores    Current Outpatient Medications  Medication Sig Dispense Refill  . albuterol (PROVENTIL HFA;VENTOLIN HFA) 108 (90 Base) MCG/ACT inhaler Inhale into the lungs every 6 (six) hours as needed for wheezing or shortness of breath.    . benzonatate (TESSALON) 100 MG capsule Take 1 capsule (100 mg total) by mouth 3 (three) times daily as needed for cough. 30 capsule 0  . butalbital-acetaminophen-caffeine (FIORICET) 50-325-40 MG tablet Take 1 tablet by  mouth every 6 (six) hours as needed for headache. 30 tablet 2  . cyclobenzaprine (FLEXERIL) 10 MG tablet TAKE 1 TABLET BY MOUTH 3 TIMES DAILY AS NEEDED FOR MUSCLE SPASMS. 90 tablet 0  . desloratadine (CLARINEX) 5 MG tablet Take 5 mg by mouth daily.    Marland Kitchen EPINEPHrine (AUVI-Q) 0.3 mg/0.3 mL IJ SOAJ injection Inject 0.3 mg into the muscle once.    Eduard Roux (AIMOVIG) 140 MG/ML SOAJ Inject 140 mg into the skin every 30 (thirty) days. 1 mL 5  . escitalopram (LEXAPRO) 20 MG tablet Take 1 tablet (20 mg total) by mouth daily. 90 tablet 4  . HYDROcodone-acetaminophen (NORCO/VICODIN) 5-325 MG tablet Take 1 tablet by mouth every 6 (six) hours as needed for moderate pain. 10 tablet 0  . ibuprofen (ADVIL) 800 MG tablet Take 1 tablet (800 mg total) by mouth every 8 (eight) hours as needed. 30 tablet 0  . naratriptan (AMERGE) 2.5 MG tablet Take 1 tablet (2.5 mg total) by mouth as needed for migraine. may repeat after 2 hours. 10 tablet 11  . norethindrone (MICRONOR) 0.35 MG tablet Take by mouth.    . ondansetron (ZOFRAN) 4 MG tablet Take 1 tablet (4 mg total) by mouth every 8 (eight)  hours as needed for nausea or vomiting. 30 tablet 11  . predniSONE (DELTASONE) 20 MG tablet Take 2 tablets (40 mg total) by mouth daily with breakfast. 10 tablet 0  . QVAR REDIHALER 80 MCG/ACT inhaler Inhale 2 puffs into the lungs 2 (two) times daily.   5  . Rimegepant Sulfate (NURTEC) 75 MG TBDP Take 75 mg by mouth daily as needed. For migraines. Take as close to onset of migraine as possible. One daily maximum. 10 tablet 6  . spironolactone (ALDACTONE) 50 MG tablet Take 50 mg by mouth 2 (two) times daily.   3  . SYMBICORT 80-4.5 MCG/ACT inhaler Inhale 2 puffs into the lungs 2 (two) times daily.    Marland Kitchen TROKENDI XR 200 MG CP24 TAKE 1 CAPSULE BY MOUTH AT BEDTIME. 30 capsule 11   No current facility-administered medications for this visit.    OBJECTIVE: Young white woman  There were no vitals filed for this visit.   There is no  height or weight on file to calculate BMI.   Wt Readings from Last 3 Encounters:  06/20/19 162 lb 11.2 oz (73.8 kg)  02/13/19 150 lb (68 kg)  01/14/19 151 lb 6.4 oz (68.7 kg)     LAB RESULTS:  CMP     Component Value Date/Time   NA 143 06/12/2019 1507   K 4.2 06/12/2019 1507   CL 107 06/12/2019 1507   CO2 26 06/12/2019 1507   GLUCOSE 102 (H) 06/12/2019 1507   BUN 9 06/12/2019 1507   CREATININE 0.82 06/12/2019 1507   CREATININE 1.05 04/29/2016 1547   CALCIUM 9.1 06/12/2019 1507   PROT 7.0 06/12/2019 1507   ALBUMIN 3.9 06/12/2019 1507   AST 14 (L) 06/12/2019 1507   ALT 13 06/12/2019 1507   ALKPHOS 63 06/12/2019 1507   BILITOT 0.5 06/12/2019 1507   GFRNONAA >60 06/12/2019 1507   GFRAA >60 06/12/2019 1507    No results found for: TOTALPROTELP, ALBUMINELP, A1GS, A2GS, BETS, BETA2SER, GAMS, MSPIKE, SPEI  No results found for: KPAFRELGTCHN, LAMBDASER, KAPLAMBRATIO  Lab Results  Component Value Date   WBC 9.5 06/12/2019   NEUTROABS 6.6 06/12/2019   HGB 14.8 06/12/2019   HCT 45.0 06/12/2019   MCV 95.1 06/12/2019   PLT 235 06/12/2019   No results found for: LABCA2  No components found for: BBCWUG891  No results for input(s): INR in the last 168 hours.  No results found for: LABCA2  No results found for: QXI503  No results found for: UUE280  No results found for: KLK917  No results found for: CA2729  No components found for: HGQUANT  No results found for: CEA1 / No results found for: CEA1   No results found for: AFPTUMOR  No results found for: CHROMOGRNA  No results found for: HGBA, HGBA2QUANT, HGBFQUANT, HGBSQUAN (Hemoglobinopathy evaluation)   Lab Results  Component Value Date   LDH 127 05/10/2010    No results found for: IRON, TIBC, IRONPCTSAT (Iron and TIBC)  No results found for: FERRITIN  Urinalysis    Component Value Date/Time   COLORURINE AMBER (A) 09/09/2012 0815   APPEARANCEUR CLOUDY (A) 09/09/2012 0815   LABSPEC 1.026 09/09/2012  0815   PHURINE 5.5 09/09/2012 0815   GLUCOSEU NEGATIVE 09/09/2012 0815   HGBUR LARGE (A) 09/09/2012 0815   HGBUR negative 05/04/2007 1526   BILIRUBINUR negative 12/07/2017 1216   KETONESUR 15 (A) 09/09/2012 0815   PROTEINUR Positive (A) 12/07/2017 1216   PROTEINUR 100 (A) 09/09/2012 0815   UROBILINOGEN 1.0  12/07/2017 1216   UROBILINOGEN 1.0 09/09/2012 0815   NITRITE negative 12/07/2017 1216   NITRITE NEGATIVE 09/09/2012 0815   LEUKOCYTESUR Negative 12/07/2017 1216     STUDIES: No results found.  ELIGIBLE FOR AVAILABLE RESEARCH PROTOCOL: no  ASSESSMENT: 34 y.o. [place], [place] woman with a deleterious CHEK2 mutation  (1) genetics testing 12/10/2018 showed a likely pathogenic variant in the CHEK2 gene, called c.1427C>T  (a) tests through the CancerNext+RNAinsight gene panel offered by [employer] found no additional deleterious mutations in APC*, ATM*, AXIN2, BARD1, BMPR1A, BRCA1*, BRCA2*, BRIP1*, CDH1*, CDK4, CDKN2A, CHEK2*, DICER1, MLH1*, MSH2*, MSH3, MSH6*, MUTYH*, NBN, NF1*, NTHL1, PALB2*, PMS2*, PTEN*, RAD51C*, RAD51D*, RECQL, SMAD4, SMARCA4, STK11 and TP53* (sequencing and deletion/duplication); HOXB13, POLD1 and POLE (sequencing only); EPCAM and GREM1 (deletion/duplication only). DNA and RNA analyses performed for * genes.  (2) intensified screening: Yearly MRI articulating with yearly mammogram  (a) left lumpectomy 06/20/2019 showed a fibroadenoma  (b) right lumpectomy 06/20/2019 showed a fibroadenoma and ductal papilloma   PLAN:  Michele Mcbride did not show for her 07/30/2019 visit.  A follow-up letter was sent.  Chauncey Cruel, MD   07/31/2019 8:31 AM Medical Oncology and Hematology [facility], [place] [address] Tel. [phone]    Fax. [phone]   This document serves as a record of services personally performed by Lurline Del, MD. It was created on his behalf by Wilburn Mylar, a trained medical scribe. The  creation of this record is based on the scribe's personal observations and the provider's statements to them.   I, Lurline Del MD, have reviewed the above documentation for accuracy and completeness, and I agree with the above.   *Total Encounter Time as defined by the Centers for Medicare and Medicaid Services includes, in addition to the face-to-face time of a patient visit (documented in the note above) non-face-to-face time: obtaining and reviewing outside history, ordering and reviewing medications, tests or procedures, care coordination (communications with other health care professionals or caregivers) and documentation in the medical record.

## 2019-07-30 ENCOUNTER — Inpatient Hospital Stay: Payer: 59 | Attending: Oncology | Admitting: Oncology

## 2019-07-30 DIAGNOSIS — Z1589 Genetic susceptibility to other disease: Secondary | ICD-10-CM

## 2019-07-30 DIAGNOSIS — Z1502 Genetic susceptibility to malignant neoplasm of ovary: Secondary | ICD-10-CM

## 2019-07-30 DIAGNOSIS — Z1239 Encounter for other screening for malignant neoplasm of breast: Secondary | ICD-10-CM

## 2019-07-30 DIAGNOSIS — Z1509 Genetic susceptibility to other malignant neoplasm: Secondary | ICD-10-CM

## 2019-07-30 DIAGNOSIS — Z9189 Other specified personal risk factors, not elsewhere classified: Secondary | ICD-10-CM

## 2019-07-30 DIAGNOSIS — Z1501 Genetic susceptibility to malignant neoplasm of breast: Secondary | ICD-10-CM

## 2019-07-31 ENCOUNTER — Encounter: Payer: Self-pay | Admitting: Oncology

## 2019-08-07 ENCOUNTER — Other Ambulatory Visit: Payer: Self-pay | Admitting: Oncology

## 2019-08-24 ENCOUNTER — Telehealth: Payer: 59 | Admitting: Physician Assistant

## 2019-08-24 DIAGNOSIS — B353 Tinea pedis: Secondary | ICD-10-CM

## 2019-08-24 MED ORDER — TERBINAFINE HCL 1 % EX CREA: TOPICAL_CREAM | Freq: Two times a day (BID) | CUTANEOUS | Status: DC

## 2019-08-24 NOTE — Progress Notes (Signed)
E-Visit for Athlete's Foot 2 We are sorry that you are not feeling well. Here is how we plan to help!  Based on what you shared with me it looks like you have tinea pedis, or "Athlete's Foot".  This type of rash can spread through shared towels, clothing, bedding, etc., as well as hard surfaces (particularly in moist areas) such as shower stalls, locker room floors, pool areas, etc. The symptoms of Athlete's Foot include red, swollen, peeling, itchy skin between the toes (especially between the pinky toe and the one next to it). The sole and heel of the foot may also be affected. In severe cases, the skin on the feet can blister.  Athlete's foot can usually be treated with over-the-counter topical antifungal products; but sometimes with chronic or extensive tinea pedis, prescription oral medications are needed.   I am recommending:Terbinafine 1% cream or gel, apply to area once or twice per day   HOME CARE:  . Keep feet clean, dry, and cool. . Avoid using swimming pools, public showers, or foot baths. . Wear sandals when possible or air shoes out by alternating them every 2-3 days. . Avoid wearing closed shoes and wearing socks made from fabric that doesn't dry easily (for example, nylon). . Treat the infection with recommended medication  GET HELP RIGHT AWAY IF:  . Symptoms that don't go away after treatment. . Severe itching that persists. . If your rash spreads or swells. . If your rash begins to have drainage or smell. . You develop a fever.  MAKE SURE YOU   . Understand these instructions. . Will watch your condition. . Will get help right away if you are not doing well or get worse.   Thank you for choosing an e-visit.  Your e-visit answers were reviewed by a board certified advanced clinical practitioner to complete your personal care plan. Depending upon the condition, your plan could have included both over the counter or prescription medications.  Please  review your pharmacy choice. Make sure the pharmacy is open so you can pick up prescription now. If there is a problem, you may contact your provider through [employer] and have the prescription routed to another pharmacy.  Your safety is important to [place]. If you have drug allergies check your prescription carefully.   For the next 24 hours you can use MyChart to ask questions about today's visit, request a non-urgent call back, or ask for a work or school excuse.  You will get an email in the next two days asking about your experience. I hope that your e-visit has been valuable and will speed your recovery  References or for more information:  [email or web address]?search=[email or web address]%20treatment&source=search_result&selectedTitle=1~104&usage_type=default&display_rank=1  [email or web address]     Greater than 5 minutes, yet less than 10 minutes of time have been spent researching, coordinating, and implementing care for this patient today

## 2019-09-12 ENCOUNTER — Encounter: Payer: Self-pay | Admitting: Physician Assistant

## 2019-09-22 ENCOUNTER — Other Ambulatory Visit: Payer: Self-pay | Admitting: Neurology

## 2019-09-22 MED ORDER — BUTALBITAL-APAP-CAFFEINE 50-325-40 MG PO TABS: 1.0000 | ORAL_TABLET | Freq: Four times a day (QID) | ORAL | 2 refills | Status: DC | PRN

## 2019-09-23 MED FILL — BUTALB-ACETAMIN-CAFF 50-325: 50-325-40 | 90 days supply | Qty: 30 | Fill #0

## 2019-09-25 DIAGNOSIS — Z3200 Encounter for pregnancy test, result unknown: Secondary | ICD-10-CM | POA: Diagnosis not present

## 2019-09-25 DIAGNOSIS — Z3490 Encounter for supervision of normal pregnancy, unspecified, unspecified trimester: Secondary | ICD-10-CM | POA: Diagnosis not present

## 2019-09-25 DIAGNOSIS — Z3689 Encounter for other specified antenatal screening: Secondary | ICD-10-CM | POA: Diagnosis not present

## 2019-09-27 DIAGNOSIS — Z3490 Encounter for supervision of normal pregnancy, unspecified, unspecified trimester: Secondary | ICD-10-CM | POA: Diagnosis not present

## 2019-10-10 DIAGNOSIS — Z3201 Encounter for pregnancy test, result positive: Secondary | ICD-10-CM | POA: Diagnosis not present

## 2019-10-14 ENCOUNTER — Encounter: Payer: Self-pay | Admitting: Neurology

## 2019-10-14 ENCOUNTER — Ambulatory Visit: Payer: 59 | Admitting: Neurology

## 2019-10-14 ENCOUNTER — Other Ambulatory Visit: Payer: Self-pay | Admitting: Neurology

## 2019-10-14 VITALS — BP 114/82 | HR 109 | Ht 62.0 in | Wt 167.0 lb

## 2019-10-14 DIAGNOSIS — G43711 Chronic migraine without aura, intractable, with status migrainosus: Secondary | ICD-10-CM

## 2019-10-14 MED ORDER — METOCLOPRAMIDE HCL 10 MG PO TABS: 10.0000 mg | ORAL_TABLET | Freq: Four times a day (QID) | ORAL | 3 refills | Status: DC | PRN

## 2019-10-14 MED ORDER — ACETAMINOPHEN-CODEINE 300-30 MG PO TABS: 1.0000 | ORAL_TABLET | ORAL | 0 refills | Status: DC | PRN

## 2019-10-14 MED ORDER — CYPROHEPTADINE HCL 4 MG PO TABS: 4.0000 mg | ORAL_TABLET | Freq: Three times a day (TID) | ORAL | 3 refills | Status: DC | PRN

## 2019-10-14 MED FILL — METOCLOPRAMIDE 10 MG TABLET: 10 | 7 days supply | Qty: 30 | Fill #0

## 2019-10-14 MED FILL — ACETAMINOPHEN/COD #3 TABLET: 300-30 | 5 days supply | Qty: 30 | Fill #0

## 2019-10-14 MED FILL — CYPROHEPTADINE 4 MG TABLET: 4 | 30 days supply | Qty: 90 | Fill #0

## 2019-10-14 NOTE — Progress Notes (Signed)
[facility]    Provider:  Dr Ahern Referring Provider: Dr. Devashwar Primary Care Physician:  Dr. Devashwar  CC:  Migraines  Interval history 10/14/2019; patient is pregnant and due in March. She had stopped most of her medications including the topamax and aimovig, in fact most medications were stopped in April. She is [redacted] weeks pregnant and have seen the ultrasound, this was a surprise due to her pcos. In July she had several days of headaches, having 5 headache days. We discussed pregnancy and migraines, no medictaion is safe, I gave her literature and we reviewed the possible migraine preventatives and acute medications and risks in pregnancy.  Patient complains of symptoms per HPI as well as the following symptoms: headache, fatigue, stress, pregnancy . Pertinent negatives and positives per HPI. All others negative   Interval history 10/08/2018: This is a 33-year-old patient who is here for follow-up of migraines.  She has had migraine since the age of 8.  Started medications at the age of 19 and been to multiple neurologists.  She has been doing very well on CGRP medications the last year.  She has been under stress in the past.  Aimovig and Trokendi have been very successful.  We did fill out her FMLA forms and we will repeat those for the next year. Lots of stress but different than last year. 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   Treid: axert, maxalt, sumatriptan, amerge worked the best, trig zomig po and nasal  Interval history 12/11/2017: She is under a lot of stress, her parents moved in with her. She supports 5 people. Her ex-husband lost his job and so decreased child support. She is on Aimovig. She does not want to have botox completed. Discussed strategies for stress management. She feels the Aimovig is improving her migraines for now. Will continue on the Trokendi.  HPI:  Michele Mcbride is a 34 y.o. female here as  a referral from Dr. Tabori for migraines. Suffering since the age of [age]. Started medications at the age of 19. She has seen previous neurologists. She wakes with migraines and worse bending over and with valsalva. Can take her out of work for 3 days. She has light sensitivity, sound sensitivity, nausea, vomiting, worse with movement, its unilateral either side may spread to the whole head, pounding throbbing, can be severe. 22 headache days a month, 5 severe migraine days, 5-7 moderately severe migraines, 10 mild migraines or dull headaches. Can worsen with bending over and valsalva. No aura, + vision changes, no weakness or numbness. No medication overuse. This frequency and severity has been ongoing for a year and intractable. Mother, sister,2 aunts, cousin, grandmother have migraines. She has associated neck pain/ No other focal neurologic deficits, associated symptoms, inciting events or modifiable factors. She has examined food triggers, red wine trigers, garlic, sleeping well and getting enough sleep, exercises. No other focal neurologic deficits, associated symptoms, inciting events or modifiable factors.  Medications tried: Topamax currently, propranolol, fioricetl, exapro, zofran, imitrex, axert, relpax, has been on Topamax 200mg, reglan, robaxin, tizanidine, Lexapro  Reviewed notes, labs and imaging from outside physicians, which showed:  TSH normal  Dg cervical spine 2004: reviewed report:  IMPRESSION NEGATIVE FOR FRACTURE. LUMBAR SPINE 4 VIEWS THE PATIENT HAS FIVE NON-RIB-BEARING LUMBAR VERTEBRAE.  THE ALIGNMENT IS ANATOMIC.  POSTERIOR ELEMENTS ARE INTACT.  SACROILIAC JOINTS ARE SYMMETRIC.  THE SACRUM IS NORMAL. IMPRESSION NEGATIVE FOR FRACTURE.  Review of   Systems: Patient complains of symptoms per HPI as well as the following symptoms: headache. Pertinent negatives and positives per HPI. All others negative.   Social History   Socioeconomic History  . Marital status: Divorced      Spouse name: Not on file  . Number of children: 2  . Years of education: LPN  . Highest education level: Not on file  Occupational History  . Not on file  Tobacco Use  . Smoking status: Never Smoker  . Smokeless tobacco: Never Used  Vaping Use  . Vaping Use: Never used  Substance and Sexual Activity  . Alcohol use: No  . Drug use: No  . Sexual activity: Yes  Other Topics Concern  . Not on file  Social History Narrative   Lives at home with her 2 children   Right handed   Caffeine: 2 cups daily at max   Social Determinants of Health   Financial Resource Strain:   . Difficulty of Paying Living Expenses:   Food Insecurity:   . Worried About Running Out of Food in the Last Year:   . Ran Out of Food in the Last Year:   Transportation Needs:   . Lack of Transportation (Medical):   . Lack of Transportation (Non-Medical):   Physical Activity:   . Days of Exercise per Week:   . Minutes of Exercise per Session:   Stress:   . Feeling of Stress :   Social Connections:   . Frequency of Communication with Friends and Family:   . Frequency of Social Gatherings with Friends and Family:   . Attends Religious Services:   . Active Member of Clubs or Organizations:   . Attends Club or Organization Meetings:   . Marital Status:   Intimate Partner Violence:   . Fear of Current or Ex-Partner:   . Emotionally Abused:   . Physically Abused:   . Sexually Abused:     Family History  Problem Relation Age of Onset  . Hyperlipidemia Mother   . Neuropathy Mother   . Migraines Mother   . Breast cancer Mother 37  . Migraines Other        sisters, aunts, and grandmother had migraines on mother's side  . Breast cancer Paternal Aunt     Past Medical History:  Diagnosis Date  . Anxiety   . Asthma   . Depression   . Family history of breast cancer   . Migraine   . Monoallelic mutation of CHEK2 gene in female patient   . PCOS (polycystic ovarian syndrome)   . Sinus infection      Past Surgical History:  Procedure Laterality Date  . BREAST LUMPECTOMY WITH RADIOACTIVE SEED LOCALIZATION Bilateral 06/20/2019   Procedure: BILATERAL BREAST LUMPECTOMY WITH RADIOACTIVE SEED LOCALIZATION, Right x 2 and Left x1;  Surgeon: Cornett, Thomas, MD;  Location: [facility];  Service: General;  Laterality: Bilateral;  . WISDOM TOOTH EXTRACTION      Current Outpatient Medications  Medication Sig Dispense Refill  . Prenatal Vit-Fe Fumarate-FA (PRENATAL PO) Take by mouth.    . Acetaminophen-Codeine (TYLENOL/CODEINE #3) 300-30 MG tablet Take 1 tablet by mouth every 4 (four) hours as needed for pain. For migraine 30 tablet 0  . cyproheptadine (PERIACTIN) 4 MG tablet Take 1 tablet (4 mg total) by mouth 3 (three) times daily as needed. For migraine 90 tablet 3  . metoCLOPramide (REGLAN) 10 MG tablet Take 1 tablet (10 mg total) by mouth 4 (four) times daily as   needed for nausea. Or for migraine. 30 tablet 3   Current Facility-Administered Medications  Medication Dose Route Frequency Provider Last Rate Last Admin  . terbinafine (LAMISIL) 1 % cream   Topical BID Hedges, Jeffrey, PA-C        Allergies as of 10/14/2019 - Review Complete 10/14/2019  Allergen Reaction Noted  . Strawberry extract Anaphylaxis 03/06/2014  . Latex Rash 04/04/2014    Vitals: BP 114/82 (BP Location: Right Arm, Patient Position: Sitting)   Pulse (!) 109   Ht 5' 2" (1.575 m)   Wt 167 lb (75.8 kg)   BMI 30.54 kg/m  Last Weight:  Wt Readings from Last 1 Encounters:  10/14/19 167 lb (75.8 kg)   Last Height:   Ht Readings from Last 1 Encounters:  10/14/19 5' 2" (1.575 m)     Physical exam: Exam: Gen: NAD, conversant, well nourised, obese, well groomed                     CV: RRR, no MRG. No Carotid Bruits. No peripheral edema, warm, nontender Eyes: Conjunctivae clear without exudates or hemorrhage  Neuro: Detailed Neurologic Exam  Speech:    Speech is normal; fluent and spontaneous  with normal comprehension.  Cognition:    The patient is oriented to person, place, and time;     recent and remote memory intact;     language fluent;     normal attention, concentration,     fund of knowledge Cranial Nerves:    The pupils are equal, round, and reactive to light. The fundi are normal and spontaneous venous pulsations are present. Visual fields are full to finger confrontation. Extraocular movements are intact. Trigeminal sensation is intact and the muscles of mastication are normal. The face is symmetric. The palate elevates in the midline. Hearing intact. Voice is normal. Shoulder shrug is normal. The tongue has normal motion without fasciculations.   Coordination:    Normal finger to nose and heel to shin. Normal rapid alternating movements.   Gait:    Heel-toe and tandem gait are normal.   Motor Observation:    No asymmetry, no atrophy, and no involuntary movements noted. Tone:    Normal muscle tone.    Posture:    Posture is normal. normal erect    Strength:    Strength is V/V in the upper and lower limbs.      Sensation: intact to LT     Reflex Exam:  DTR's:    Deep tendon reflexes in the upper and lower extremities are normal bilaterally.   Toes:    The toes are downgoing bilaterally.   Clonus:    Clonus is absent.   Assessment/Plan:  33 year old with intractable migraines now pregnant [redacted] weeks  - We discussed pregnancy and migraines, no medictaion is safe, I gave her literature and we reviewed the possible migraine preventatives and acute medications and risks in pregnancy. - For acute management: Reglan, Periactin. Tylenol #3 only for severe migraines - Email obgyn but I think lexapro is ow risk and may help with stress and migraines as well - If needed consider propranolol,labetalol, gabapentin. Periactin, nortriptyline as preventative - in 3rd trimester triptans would be considered low risk but discuss with obgyn first   PRIOR:  Start  Lexapro. Happy to start Wellbutrin as well if needed. Continue Topiramate, Aimovig - doing well Try for acute management: Tosymra (free copay card), Nurtex (free copay card) Amerge orally - don;t use amerge with   tosymra.  Tosymra - QUICK in 5 minutes but only lasts a few hours take for acute onset migraie Amerge - fast acting within an hour but lasts longer 6 hours is 1/2 life, take when don;t need immediate relief(take Tosymra for that) Nurtex: take as soon as possible and can combine with a triptan  Prior:  - dry needling for cervical myofascial pain ordered  - MRI brain w/wo contrast unremarkable  -Discussed botox she would like Aimovig initially for migraineprevention. She does not want to have Botox.    - Dry needling for cervical myofascial pain  - Continue Trokendi  - Continue flexeril for neck and muscle spasms  - Zomig did not help at all. She is back on Imitrex despite the side effects, it works.    Discussed: To prevent or relieve headaches, try the following: Cool Compress. Lie down and place a cool compress on your head.  Avoid headache triggers. If certain foods or odors seem to have triggered your migraines in the past, avoid them. A headache diary might help you identify triggers.  Include physical activity in your daily routine. Try a daily walk or other moderate aerobic exercise.  Manage stress. Find healthy ways to cope with the stressors, such as delegating tasks on your to-do list.  Practice relaxation techniques. Try deep breathing, yoga, massage and visualization.  Eat regularly. Eating regularly scheduled meals and maintaining a healthy diet might help prevent headaches. Also, drink plenty of fluids.  Follow a regular sleep schedule. Sleep deprivation might contribute to headaches Consider biofeedback. With this mind-body technique, you learn to control certain bodily functions -- such as muscle tension, heart rate and blood pressure -- to prevent headaches  or reduce headache pain.    Proceed to emergency room if you experience new or worsening symptoms or symptoms do not resolve, if you have new neurologic symptoms or if headache is severe, or for any concerning symptom.   Provided education and documentation from American headache Society toolbox including articles on: chronic migraine medication overuse headache, chronic migraines, prevention of migraines, behavioral and other nonpharmacologic treatments for headache.   A total of 25 minutes was spent face-to-face with this patient. Over half this time was spent on counseling patient on the  1. Chronic migraine without aura, with intractable migraine, so stated, with status migrainosus    diagnosis and different diagnostic and therapeutic options, counseling and coordination of care, risks ans benefits of management, compliance, or risk factor reduction and education.     A total of 40 minutes was spent face-to-face with this patient. Over half this time was spent on counseling patient on the  1. Chronic migraine without aura, with intractable migraine, so stated, with status migrainosus    diagnosis and different diagnostic and therapeutic options, counseling and coordination of care, risks ans benefits of management, compliance, or risk factor reduction and education.    Antonia Ahern, MD  [facility] [address] [place], [place] [address]  Phone [phone] Fax [phone]  

## 2019-10-14 NOTE — Patient Instructions (Signed)
- For acute management: Reglan, Periactin. Tylenol #3 only for severe migraines - Email obgyn but I think lexapro is low risk and may help with stress and migraines as well - If needed consider propranolol,labetalol, gabapentin. Periactin, nortriptyline as preventative - in 3rd trimester triptans would be considered low risk but discuss with obgyn first  There are limited treatments in pregnancy and all have risks. Try reglan and/or cyproheptadine acutely. We can recommend Tylenol #3 very sparingly for severe headache. Tylenol is generally accepted as safe in pregnancy although Codeine is pregnany class C and cannot rule out risk. Discussed at length with patient.  These drugs should not be used on a chronic basis since they are habit-forming and can contribute to the development of medication overuse and chronic daily headaches. They may also worsen the constipation associated with pregnancy. All controlled substances have potential for maternal addiction and neonatal withdrawal; thus, they should not be used for prolonged periods of time.   Other interventions to try and decreased the use of medication for headache/migraine include:  Cool Compress. Lie down and place a cool compress on your head.  Avoid headache triggers. If certain foods or odors seem to have triggered your migraines in the past, avoid them. A headache diary might help you identify triggers.  Include physical activity in your daily routine. Try a daily walk or other moderate aerobic exercise.  Manage stress. Find healthy ways to cope with the stressors, such as delegating tasks on your to-do list.  Practice relaxation techniques. Try deep breathing, yoga, massage and visualization.  Eat regularly. Eating regularly scheduled meals and maintaining a healthy diet might help prevent headaches. Also, drink plenty of fluids.  Follow a regular sleep schedule. Sleep deprivation might contribute to headaches  Consider biofeedback. With this  mind-body technique, you learn to control certain bodily functions -- such as muscle tension, heart rate and blood pressure -- to prevent headaches or reduce headache pain.  Headaches during pregnancy are common. However, if you develop a severe headache or a headache that doesn't go away, call your health care provider. Severe headaches can be a sign of a pregnancy complication   Cyproheptadine tablets What is this medicine? CYPROHEPTADINE (si proe HEP ta deen) is a antihistamine. This medicine is used to treat allergy symptoms. It is can help stop runny nose, watery eyes, and itchy rash. This medicine may be used for other purposes; ask your health care provider or pharmacist if you have questions. COMMON BRAND NAME(S): Periactin What should I tell my health care provider before I take this medicine? They need to know if you have any of these conditions:  any chronic disease  glaucoma  prostate disease  ulcers or other stomach problems  an unusual or allergic reaction to cyproheptadine, other medicines foods, dyes, or preservatives  pregnant or trying to get pregnant  breast-feeding How should I use this medicine? Take this medicine by mouth with a glass of water. Follow the directions on the prescription label. Take your doses at regular intervals. Do not take your medicine more often than directed. Talk to your pediatrician regarding the use of this medicine in children. While this drug may be prescribed for children as young as 74 years of age for selected conditions, precautions do apply. Overdosage: If you think you have taken too much of this medicine contact a poison control center or emergency room at once. NOTE: This medicine is only for you. Do not share this medicine with others. What if  I miss a dose? If you miss a dose, take it as soon as you can. If it is almost time for your next dose, take only that dose. Do not take double or extra doses. What may interact with this  medicine? Do not take this medicine with any of the following medications:  MAOIs like Carbex, Eldepryl, Marplan, Nardil, and Parnate This medicine may also interact with the following medications:  alcohol  barbiturate medicines for inducing sleep or treating seizures  medicines for depression, anxiety or psychotic disturbances  medicines for movement abnormalities  medicines for sleep  medicines for stomach problems  some medicines for cold or allergies This list may not describe all possible interactions. Give your health care provider a list of all the medicines, herbs, non-prescription drugs, or dietary supplements you use. Also tell them if you smoke, drink alcohol, or use illegal drugs. Some items may interact with your medicine. What should I watch for while using this medicine? Visit your doctor or health care professional for regular check ups. Tell your doctor if your symptoms do not improve or if they get worse. You may get drowsy or dizzy. Do not drive, use machinery, or do anything that needs mental alertness until you know how this medicine affects you. Do not stand or sit up quickly, especially if you are an older patient. This reduces the risk of dizzy or fainting spells. Alcohol may interfere with the effect of this medicine. Avoid alcoholic drinks. Your mouth may get dry. Chewing sugarless gum or sucking hard candy, and drinking plenty of water may help. Contact your doctor if the problem does not go away or is severe. This medicine may cause dry eyes and blurred vision. If you wear contact lenses you may feel some discomfort. Lubricating drops may help. See your eye doctor if the problem does not go away or is severe. This medicine can make you more sensitive to the sun. Keep out of the sun. If you cannot avoid being in the sun, wear protective clothing and use sunscreen. Do not use sun lamps or tanning beds/booths. What side effects may I notice from receiving this  medicine? Side effects that you should report to your doctor or health care professional as soon as possible:  allergic reactions like skin rash, itching or hives, swelling of the face, lips, or tongue  agitation, nervousness, excitability, not able to sleep  chest pain  irregular, fast heartbeat  pain or difficulty passing urine  seizures  unusual bleeding or bruising  unusually weak or tired  yellowing of the eyes or skin Side effects that usually do not require medical attention (report to your doctor or health care professional if they continue or are bothersome):  constipation or diarrhea  headache  loss of appetite  nausea, vomiting  stomach upset  weight gain This list may not describe all possible side effects. Call your doctor for medical advice about side effects. You may report side effects to FDA at 1-800-FDA-1088. Where should I keep my medicine? Keep out of the reach of children. Store at room temperature between 15 and 30 degrees C (59 and 86 degrees F). Keep container tightly closed. Throw away any unused medicine after the expiration date. NOTE: This sheet is a summary. It may not cover all possible information. If you have questions about this medicine, talk to your doctor, pharmacist, or health care provider.  2020 Elsevier/Gold Standard (2007-05-28 16:29:53) Metoclopramide tablets What is this medicine? METOCLOPRAMIDE (met oh kloe PRA mide)  is used to treat the symptoms of gastroesophageal reflux disease (GERD) like heartburn. It is also used to treat people with slow emptying of the stomach and intestinal tract. This medicine may be used for other purposes; ask your health care provider or pharmacist if you have questions. COMMON BRAND NAME(S): Reglan What should I tell my health care provider before I take this medicine? They need to know if you have any of these conditions:  breast cancer  depression  diabetes  heart failure  high blood  pressure  if you often drink alcohol  kidney disease  liver disease  Parkinson's disease or a movement disorder  pheochromocytoma  seizures  stomach obstruction, bleeding, or perforation  an unusual or allergic reaction to metoclopramide, procainamide, other medicines, foods, dyes, or preservatives  pregnant or trying to get pregnant  breast-feeding How should I use this medicine? Take this medicine by mouth with a glass of water. Follow the directions on the prescription label. Take this medicine on an empty stomach, about 30 minutes before eating. Take your doses at regular intervals. Do not take your medicine more often than directed. Do not stop taking except on the advice of your doctor or health care professional. A special MedGuide will be given to you by the pharmacist with each prescription and refill. Be sure to read this information carefully each time. Talk to your pediatrician regarding the use of this medicine in children. Special care may be needed. Overdosage: If you think you have taken too much of this medicine contact a poison control center or emergency room at once. NOTE: This medicine is only for you. Do not share this medicine with others. What if I miss a dose? If you miss a dose, skip it. Take your next dose at the normal time. Do not take extra or 2 doses at the same time to make up for the missed dose. What may interact with this medicine?  alcohol  antihistamines for allergy, cough, and cold  atovaquone  atropine  bupropion  certain medicines for anxiety or sleep  certain medicines for bladder problems like oxybutynin, tolterodine  certain medicines for depression or psychotic disorders  certain medicines for Parkinson's disease  certain medicines for seizures like phenobarbital, primidone  certain medicines for stomach problems like dicyclomine, hyoscyamine  certain medicines for travel sickness like  scopolamine  cyclosporine  digoxin  fosfomycin  general anesthetics like halothane, isoflurane, methoxyflurane, propofol  insulin and other medicines for diabetes  ipratropium  MAOIs like Carbex, Eldepryl, Marplan, Nardil, and Parnate  medicines that relax muscles for surgery  narcotic medicines for pain  paroxetine  phenothiazines like chlorpromazine, mesoridazine, prochlorperazine, thioridazine  posaconazole  quinidine  sirolimus  tacrolimus This list may not describe all possible interactions. Give your health care provider a list of all the medicines, herbs, non-prescription drugs, or dietary supplements you use. Also tell them if you smoke, drink alcohol, or use illegal drugs. Some items may interact with your medicine. What should I watch for while using this medicine? It may take a few weeks for your stomach condition to start to get better. However, do not take this medicine for longer than 12 weeks. The longer you take this medicine, and the more you take it, the greater your chances are of developing serious side effects. If you are an elderly patient, a female patient, or you have diabetes, you may be at an increased risk for side effects from this medicine. Contact your doctor immediately if you start  having movements you cannot control such as lip smacking, rapid movements of the tongue, involuntary or uncontrollable movements of the eyes, head, arms and legs, or muscle twitches and spasms. Patients and their families should watch out for worsening depression or thoughts of suicide. Also watch out for any sudden or severe changes in feelings such as feeling anxious, agitated, panicky, irritable, hostile, aggressive, impulsive, severely restless, overly excited and hyperactive, or not being able to sleep. If this happens, especially at the beginning of treatment or after a change in dose, call your doctor. Do not treat yourself for high fever. Ask your doctor or health  care professional for advice. You may get drowsy or dizzy. Do not drive, use machinery, or do anything that needs mental alertness until you know how this drug affects you. Do not stand or sit up quickly, especially if you are an older patient. This reduces the risk of dizzy or fainting spells. Alcohol can make you more drowsy and dizzy. Avoid alcoholic drinks. What side effects may I notice from receiving this medicine? Side effects that you should report to your doctor or health care professional as soon as possible:  allergic reactions like skin rash, itching or hives, swelling of the face, lips, or tongue  abnormal production of milk  breast enlargement in both males and females  change in sex drive or performance  depressed mood  menstrual changes  restlessness, pacing, inability to keep still  signs and symptoms of neuroleptic malignant syndrome (NMS) like confusion; fast, irregular heartbeat; high fever; increased sweating; uncontrollable head, mouth, neck, arm, or leg movements; stiff muscles  seizures  suicidal thoughts, mood changes  swelling of the ankles, feet, hands  tremor  uncontrollable movements of the face, head, mouth, neck, arms, legs, or upper body  unusually weak or tired Side effects that usually do not require medical attention (report to your doctor or health care professional if they continue or are bothersome):  dizziness  drowsiness  headache  tiredness This list may not describe all possible side effects. Call your doctor for medical advice about side effects. You may report side effects to FDA at 1-800-FDA-1088. Where should I keep my medicine? Keep out of the reach of children. Store at room temperature between 20 and 25 degrees C (68 and 77 degrees F). Protect from light. Keep container tightly closed. Throw away any unused medicine after the expiration date. NOTE: This sheet is a summary. It may not cover all possible information. If you  have questions about this medicine, talk to your doctor, pharmacist, or health care provider.  2020 Elsevier/Gold Standard (2018-10-15 10:16:27) Acetaminophen; Codeine tablets What is this medicine? ACETAMINOPHEN; CODEINE (a set a MEE noe fen; KOE deen) is a pain reliever. It is used to treat mild to moderate pain. This medicine may be used for other purposes; ask your health care provider or pharmacist if you have questions. COMMON BRAND NAME(S): Cocet, Cocet Plus, Tylenol with Codeine No.3, Tylenol with Codeine No.4, Vopac What should I tell my health care provider before I take this medicine? They need to know if you have any of these conditions:  brain tumor  Crohn's disease, inflammatory bowel disease, or ulcerative colitis  drug abuse or addiction  head injury  heart or circulation problems  if you often drink alcohol  kidney disease or problems going to the bathroom  liver disease  lung disease, asthma, or breathing problems  an unusual or allergic reaction to acetaminophen, codeine, salicylates, other opioid analgesics,  other medicines, foods, dyes, or preservatives  pregnant or trying to get pregnant  breast-feeding How should I use this medicine? Take this medicine by mouth with a full glass of water. Follow the directions on the prescription label. You can take it with or without food. If it upsets your stomach, take the medicine with food. Do not take your medicine more often than directed. A special MedGuide will be given to you by the pharmacist with each prescription and refill. Be sure to read this information carefully each time. Talk to your pediatrician regarding the use of this medicine in children. This medicine is not for use in children less than 58 years of age. Do not give this medicine to a child younger than 69 years of age after surgery to remove the tonsils and/or adenoids. Overdosage: If you think you have taken too much of this medicine contact a  poison control center or emergency room at once. NOTE: This medicine is only for you. Do not share this medicine with others. What if I miss a dose? If you miss a dose, take it as soon as you can. If it is almost time for your next dose, take only that dose. Do not take double or extra doses. What may interact with this medicine? This medicine may interact with the following medications:  alcohol  antihistamines for allergy, cough and cold  antiviral medicines used for HIV or AIDS  atropine  certain antibiotics like erythromycin and clarithromycin  certain medicines for anxiety or sleep  certain medicines for bladder problems like oxybutynin, tolterodine  certain medicines for depression like amitriptyline, fluoxetine, sertraline  certain medicines for fungal infections like ketoconazole and itraconazole  certain medicines for irregular heart beat like amiodarone, propafenone, quinidine  certain medicines for Parkinson's disease like benztropine, trihexyphenidyl  certain medicines for seizures like carbamazepine, phenobarbital, phenytoin, primidone  certain medicines for stomach problems like dicyclomine, hyoscyamine  certain medicines for travel sickness like scopolamine  general anesthetics like halothane, isoflurane, methoxyflurane, propofol  ipratropium  local anesthetics like lidocaine, pramoxine, tetracaine  MAOIs like Carbex, Eldepryl, Marplan, Nardil, and Parnate  medicines that relax muscles for surgery  other medicines with acetaminophen  other narcotic medicines for pain or cough  phenothiazines like chlorpromazine, mesoridazine, prochlorperazine, thioridazine  rifampin This list may not describe all possible interactions. Give your health care provider a list of all the medicines, herbs, non-prescription drugs, or dietary supplements you use. Also tell them if you smoke, drink alcohol, or use illegal drugs. Some items may interact with your  medicine. What should I watch for while using this medicine? Tell your health care provider if your pain does not go away, if it gets worse, or if you have new or a different type of pain. You may develop tolerance to this drug. Tolerance means that you will need a higher dose of the drug for pain relief. Tolerance is normal and is expected if you take this drug for a long time. There are different types of narcotic drugs (opioids) for pain. If you take more than one type at the same time, you may have more side effects. Give your health care provider a list of all drugs you use. He or she will tell you how much drug to take. Do not take more drug than directed. Get emergency help right away if you have problems breathing. Do not suddenly stop taking your drug because you may develop a severe reaction. Your body becomes used to the drug. This  does NOT mean you are addicted. Addiction is a behavior related to getting and using a drug for a nonmedical reason. If you have pain, you have a medical reason to take pain drug. Your health care provider will tell you how much drug to take. If your health care provider wants you to stop the drug, the dose will be slowly lowered over time to avoid any side effects. Talk to your health care provider about naloxone and how to get it. Naloxone is an emergency drug used for an opioid overdose. An overdose can happen if you take too much opioid. It can also happen if an opioid is taken with some other drugs or substances, like alcohol. Know the symptoms of an overdose, like trouble breathing, unusually tired or sleepy, or not being able to respond or wake up. Make sure to tell caregivers and close contacts where it is stored. Make sure they know how to use it. After naloxone is given, you must get emergency help right away. Naloxone is a temporary treatment. Repeat doses may be needed. Children may be at higher risk for side effects. If your child has slow breathing, noisy  breathing, confusion, or unusual sleepiness, stop giving this drug and get emergency help right away. Do not take other drugs that contain acetaminophen with this drug. Many non-prescription drugs contain acetaminophen. Always read labels carefully. If you have questions, ask your health care provider. If you take too much acetaminophen, get medical help right away. Too much acetaminophen can be very dangerous and cause liver damage. Even if you do not have symptoms, it is important to get help right away. You may get drowsy or dizzy. Do not drive, use machinery, or do anything that needs mental alertness until you know how this drug affects you. Do not stand up or sit up quickly, especially if you are an older patient. This reduces the risk of dizzy or fainting spells. Alcohol may interfere with the effect of this drug. Avoid alcoholic drinks. This drug will cause constipation. If you do not have a bowel movement for 3 days, call your health care provider. Your mouth may get dry. Chewing sugarless gum or sucking hard candy and drinking plenty of water may help. Contact your health care provider if the problem does not go away or is severe. What side effects may I notice from receiving this medicine? Side effects that you should report to your doctor or health care professional as soon as possible:  allergic reactions like skin rash, itching or hives, swelling of the face, lips, or tongue  breathing problems  confusion  redness, blistering, peeling or loosening of the skin, including inside the mouth  signs and symptoms of low blood pressure like dizziness; feeling faint or lightheaded, falls; unusually weak or tired  trouble passing urine or change in the amount of urine  yellowing of the eyes or skin Side effects that usually do not require medical attention (report to your doctor or health care professional if they continue or are bothersome):  constipation  dry mouth  nausea,  vomiting  tiredness This list may not describe all possible side effects. Call your doctor for medical advice about side effects. You may report side effects to FDA at 1-800-FDA-1088. Where should I keep my medicine? Keep out of the reach of children. This medicine can be abused. Keep your medicine in a safe place to protect it from theft. Do not share this medicine with anyone. Selling or giving away this medicine  is dangerous and against the law. This medicine may cause accidental overdose and death if it taken by other adults, children, or pets. Mix any unused medicine with a substance like cat litter or coffee grounds. Then throw the medicine away in a sealed container like a sealed bag or a coffee can with a lid. Do not use the medicine after the expiration date. Store at room temperature between 15 and 30 degrees C (59 and 86 degrees F). NOTE: This sheet is a summary. It may not cover all possible information. If you have questions about this medicine, talk to your doctor, pharmacist, or health care provider.  2020 Elsevier/Gold Standard (2018-10-01 10:54:43)

## 2019-10-15 ENCOUNTER — Other Ambulatory Visit (HOSPITAL_COMMUNITY): Payer: Self-pay | Admitting: Obstetrics and Gynecology

## 2019-10-15 MED FILL — ESCITALOPRAM 20 MG TABLET: 20 | 90 days supply | Qty: 90 | Fill #0

## 2019-11-06 DIAGNOSIS — Z3689 Encounter for other specified antenatal screening: Secondary | ICD-10-CM | POA: Diagnosis not present

## 2019-11-06 DIAGNOSIS — Z3480 Encounter for supervision of other normal pregnancy, unspecified trimester: Secondary | ICD-10-CM | POA: Diagnosis not present

## 2019-11-06 DIAGNOSIS — O09299 Supervision of pregnancy with other poor reproductive or obstetric history, unspecified trimester: Secondary | ICD-10-CM | POA: Diagnosis not present

## 2019-11-06 DIAGNOSIS — O09521 Supervision of elderly multigravida, first trimester: Secondary | ICD-10-CM | POA: Diagnosis not present

## 2019-11-06 LAB — OB RESULTS CONSOLE RUBELLA ANTIBODY, IGM: Rubella: NON-IMMUNE/NOT IMMUNE

## 2019-11-06 LAB — OB RESULTS CONSOLE HEPATITIS B SURFACE ANTIGEN: Hepatitis B Surface Ag: NEGATIVE

## 2019-11-06 LAB — OB RESULTS CONSOLE HIV ANTIBODY (ROUTINE TESTING): HIV: NONREACTIVE

## 2019-11-06 LAB — OB RESULTS CONSOLE RPR: RPR: NONREACTIVE

## 2019-11-14 ENCOUNTER — Other Ambulatory Visit: Payer: Self-pay

## 2019-11-14 ENCOUNTER — Inpatient Hospital Stay (HOSPITAL_COMMUNITY)
Admission: AD | Admit: 2019-11-14 | Discharge: 2019-11-14 | Disposition: A | Discharge status: Home / Self Care | Payer: 59 | Attending: Obstetrics and Gynecology | Admitting: Obstetrics and Gynecology

## 2019-11-14 ENCOUNTER — Inpatient Hospital Stay (HOSPITAL_COMMUNITY): Payer: 59

## 2019-11-14 ENCOUNTER — Encounter (HOSPITAL_COMMUNITY): Payer: Self-pay | Admitting: Obstetrics and Gynecology

## 2019-11-14 DIAGNOSIS — R102 Pelvic and perineal pain: Secondary | ICD-10-CM | POA: Diagnosis not present

## 2019-11-14 DIAGNOSIS — J45909 Unspecified asthma, uncomplicated: Secondary | ICD-10-CM | POA: Insufficient documentation

## 2019-11-14 DIAGNOSIS — O99281 Endocrine, nutritional and metabolic diseases complicating pregnancy, first trimester: Secondary | ICD-10-CM | POA: Insufficient documentation

## 2019-11-14 DIAGNOSIS — O209 Hemorrhage in early pregnancy, unspecified: Secondary | ICD-10-CM | POA: Diagnosis present

## 2019-11-14 DIAGNOSIS — O468X1 Other antepartum hemorrhage, first trimester: Secondary | ICD-10-CM | POA: Diagnosis not present

## 2019-11-14 DIAGNOSIS — Z6790 Unspecified blood type, Rh positive: Secondary | ICD-10-CM | POA: Diagnosis not present

## 2019-11-14 DIAGNOSIS — F419 Anxiety disorder, unspecified: Secondary | ICD-10-CM | POA: Diagnosis not present

## 2019-11-14 DIAGNOSIS — E876 Hypokalemia: Secondary | ICD-10-CM | POA: Insufficient documentation

## 2019-11-14 DIAGNOSIS — F329 Major depressive disorder, single episode, unspecified: Secondary | ICD-10-CM | POA: Diagnosis not present

## 2019-11-14 DIAGNOSIS — O26891 Other specified pregnancy related conditions, first trimester: Secondary | ICD-10-CM | POA: Insufficient documentation

## 2019-11-14 DIAGNOSIS — O99341 Other mental disorders complicating pregnancy, first trimester: Secondary | ICD-10-CM | POA: Diagnosis not present

## 2019-11-14 DIAGNOSIS — Z3A12 12 weeks gestation of pregnancy: Secondary | ICD-10-CM | POA: Insufficient documentation

## 2019-11-14 DIAGNOSIS — O208 Other hemorrhage in early pregnancy: Secondary | ICD-10-CM | POA: Insufficient documentation

## 2019-11-14 DIAGNOSIS — O09521 Supervision of elderly multigravida, first trimester: Secondary | ICD-10-CM | POA: Insufficient documentation

## 2019-11-14 DIAGNOSIS — O4690 Antepartum hemorrhage, unspecified, unspecified trimester: Secondary | ICD-10-CM

## 2019-11-14 DIAGNOSIS — Z79899 Other long term (current) drug therapy: Secondary | ICD-10-CM | POA: Insufficient documentation

## 2019-11-14 DIAGNOSIS — O99511 Diseases of the respiratory system complicating pregnancy, first trimester: Secondary | ICD-10-CM | POA: Diagnosis not present

## 2019-11-14 LAB — OB RESULTS CONSOLE GC/CHLAMYDIA
Chlamydia: NEGATIVE
Gonorrhea: NEGATIVE

## 2019-11-14 LAB — CBC
HCT: 40 % (ref 36.0–46.0)
Hemoglobin: 13.3 g/dL (ref 12.0–15.0)
MCH: 30.6 pg (ref 26.0–34.0)
MCHC: 33.3 g/dL (ref 30.0–36.0)
MCV: 92.2 fL (ref 80.0–100.0)
Platelets: 249 10*3/uL (ref 150–400)
RBC: 4.34 MIL/uL (ref 3.87–5.11)
RDW: 12.8 % (ref 11.5–15.5)
WBC: 10.7 10*3/uL — ABNORMAL HIGH (ref 4.0–10.5)
nRBC: 0 % (ref 0.0–0.2)

## 2019-11-14 LAB — HCG, QUANTITATIVE, PREGNANCY: hCG, Beta Chain, Quant, S: 230008 m[IU]/mL — ABNORMAL HIGH (ref <5)

## 2019-11-14 LAB — COMPREHENSIVE METABOLIC PANEL
ALT: 14 U/L (ref 0–44)
AST: 17 U/L (ref 15–41)
Albumin: 3.4 g/dL — ABNORMAL LOW (ref 3.5–5.0)
Alkaline Phosphatase: 68 U/L (ref 38–126)
Anion gap: 9 (ref 5–15)
BUN: <5 mg/dL — ABNORMAL LOW (ref 6–20)
CO2: 24 mmol/L (ref 22–32)
Calcium: 9.3 mg/dL (ref 8.9–10.3)
Chloride: 105 mmol/L (ref 98–111)
Creatinine, Ser: 0.55 mg/dL (ref 0.44–1.00)
GFR calc Af Amer: >60 mL/min (ref >60)
GFR calc non Af Amer: >60 mL/min (ref >60)
Glucose, Bld: 122 mg/dL — ABNORMAL HIGH (ref 70–99)
Potassium: 3.3 mmol/L — ABNORMAL LOW (ref 3.5–5.1)
Sodium: 138 mmol/L (ref 135–145)
Total Bilirubin: 0.2 mg/dL — ABNORMAL LOW (ref 0.3–1.2)
Total Protein: 6.6 g/dL (ref 6.5–8.1)

## 2019-11-14 LAB — WET PREP, GENITAL
Sperm: NONE SEEN
Trich, Wet Prep: NONE SEEN
Yeast Wet Prep HPF POC: NONE SEEN

## 2019-11-14 LAB — ABO/RH: ABO/RH(D): A POS

## 2019-11-14 MED ORDER — POTASSIUM CHLORIDE ER 10 MEQ PO TBCR: 10.0000 meq | EXTENDED_RELEASE_TABLET | Freq: Every day | ORAL | 0 refills | Status: DC

## 2019-11-14 NOTE — MAU Provider Note (Signed)
History     CSN: [id number]  Arrival date and time: 11/14/19 8921   First Provider Initiated Contact with Patient 11/14/19 2104      Chief Complaint  Patient presents with  . Vaginal Bleeding   [name] is a 34 y.o. G3P2002 at 81w6dwho presents to MAU for [facility] Patient endorses two discrete episodes of bleeding with several small clots. Patient reports in MAU the bleeding is only present when wiping. Patient endorses mild, intermittent pelvic pain, mostly on the left side. [facility]. Patient reports she does have a confirmed IUP from her OB/GYN.   OB History    Gravida  3   Para  2   Term  2   Preterm      AB      Living  2     SAB      TAB      Ectopic      Multiple      Live Births  2           Past Medical History:  Diagnosis Date  . Anxiety   . Asthma   . Depression   . Family history of breast cancer   . Migraine   . Monoallelic mutation of CHEK2 gene in female patient   . PCOS (polycystic ovarian syndrome)   . Sinus infection     Past Surgical History:  Procedure Laterality Date  . BREAST LUMPECTOMY WITH RADIOACTIVE SEED LOCALIZATION Bilateral 06/20/2019   Procedure: BILATERAL BREAST LUMPECTOMY WITH RADIOACTIVE SEED LOCALIZATION, Right x 2 and Left x1;  Surgeon: CErroll Luna MD;  [facility] [facility]  Service: General;  Laterality: Bilateral;  . WISDOM TOOTH EXTRACTION      Family History  Problem Relation Age of Onset  . Hyperlipidemia Mother   . Neuropathy Mother   . Migraines Mother   . Breast cancer Mother 385 . Migraines Other        sisters, aunts, and grandmother had migraines on mother's side  . Breast cancer Paternal Aunt     Social History   Tobacco Use  . Smoking status: Never Smoker  . Smokeless tobacco: Never Used  Vaping Use  . Vaping  Use: Never used  Substance Use Topics  . Alcohol use: No  . Drug use: No    Allergies:  Allergies  Allergen Reactions  . Strawberry Extract Anaphylaxis  . Latex Rash    Rash and sores    Facility-Administered Medications Prior to Admission  Medication Dose Route Frequency Provider Last Rate Last Admin  . terbinafine (LAMISIL) 1 % cream   Topical BID Hedges, JDellis Filbert PA-C       Medications Prior to Admission  Medication Sig Dispense Refill Last Dose  . Acetaminophen-Codeine (TYLENOL/CODEINE #3) 300-30 MG tablet Take 1 tablet by mouth every 4 (four) hours as needed for pain. For migraine 30 tablet 0 11/13/2019 at Unknown time  . escitalopram (LEXAPRO) 20 MG tablet Take 20 mg by mouth daily.   11/14/2019 at Unknown time  . loratadine (CLARITIN) 10 MG tablet Take 10 mg by mouth daily.   11/14/2019 at Unknown time  . Prenatal Vit-Fe Fumarate-FA (PRENATAL PO) Take by mouth.   11/14/2019 at Unknown time  . cyproheptadine (PERIACTIN) 4 MG tablet Take 1 tablet (4 mg total) by mouth 3 (three) times daily  as needed. For migraine 90 tablet 3 More than a month at Unknown time  . metoCLOPramide (REGLAN) 10 MG tablet Take 1 tablet (10 mg total) by mouth 4 (four) times daily as needed for nausea. Or for migraine. 30 tablet 3 More than a month at Unknown time    Review of Systems  Constitutional: Negative for chills, diaphoresis, fatigue and fever.  Eyes: Negative for visual disturbance.  Respiratory: Negative for shortness of breath.   Cardiovascular: Negative for chest pain.  Gastrointestinal: Negative for abdominal pain, constipation, diarrhea, nausea and vomiting.  Genitourinary: Positive for pelvic pain and vaginal bleeding. Negative for dysuria, flank pain, frequency, urgency and vaginal discharge.  Neurological: Negative for dizziness, weakness, light-headedness and headaches.   Physical Exam   Blood pressure 124/84, pulse (!) 101, temperature 98.9 F (37.2 C), resp. rate 18, height '5\' 2"'   (1.575 m), weight 78 kg.  Patient Vitals for the past 24 hrs:  BP Temp Pulse Resp Height Weight  11/14/19 1947 124/84 -- (!) 101 -- -- --  11/14/19 1944 -- 98.9 F (37.2 C) -- 18 '5\' 2"'  (1.575 m) 78 kg   Physical Exam Vitals and nursing note reviewed.  Constitutional:      General: She is not in acute distress.    Appearance: Normal appearance. She is not ill-appearing, toxic-appearing or diaphoretic.  HENT:     Head: Normocephalic and atraumatic.  Pulmonary:     Effort: Pulmonary effort is normal.  Neurological:     Mental Status: She is alert and oriented to person, place, and time.  Psychiatric:        Mood and Affect: Mood normal.        Behavior: Behavior normal.        Thought Content: Thought content normal.        Judgment: Judgment normal.    Results for orders placed or performed during the hospital encounter of 11/14/19 (from the past 24 hour(s))  CBC     Status: Abnormal   Collection Time: 11/14/19  8:27 PM  Result Value Ref Range   WBC 10.7 (H) 4.0 - 10.5 K/uL   RBC 4.34 3.87 - 5.11 MIL/uL   Hemoglobin 13.3 12.0 - 15.0 g/dL   HCT 40.0 36 - 46 %   MCV 92.2 80.0 - 100.0 fL   MCH 30.6 26.0 - 34.0 pg   MCHC 33.3 30.0 - 36.0 g/dL   RDW 12.8 11.5 - 15.5 %   Platelets 249 150 - 400 K/uL   nRBC 0.0 0.0 - 0.2 %  Comprehensive metabolic panel     Status: Abnormal   Collection Time: 11/14/19  8:27 PM  Result Value Ref Range   Sodium 138 135 - 145 mmol/L   Potassium 3.3 (L) 3.5 - 5.1 mmol/L   Chloride 105 98 - 111 mmol/L   CO2 24 22 - 32 mmol/L   Glucose, Bld 122 (H) 70 - 99 mg/dL   BUN <5 (L) 6 - 20 mg/dL   Creatinine, Ser 0.55 0.44 - 1.00 mg/dL   Calcium 9.3 8.9 - 10.3 mg/dL   Total Protein 6.6 6.5 - 8.1 g/dL   Albumin 3.4 (L) 3.5 - 5.0 g/dL   AST 17 15 - 41 U/L   ALT 14 0 - 44 U/L   Alkaline Phosphatase 68 38 - 126 U/L   Total Bilirubin 0.2 (L) 0.3 - 1.2 mg/dL   GFR calc non Af Amer >60 >60 mL/min   GFR calc Af Amer >60 >60  mL/min   Anion gap 9 5 - 15   hCG, quantitative, pregnancy     Status: Abnormal   Collection Time: 11/14/19  8:27 PM  Result Value Ref Range   hCG, Beta Chain, Quant, S 230,008 (H) <5 mIU/mL  ABO/Rh     Status: None   Collection Time: 11/14/19  8:27 PM  Result Value Ref Range   ABO/RH(D) A POS    No rh immune globuloin      NOT A RH IMMUNE GLOBULIN CANDIDATE, PT RH POSITIVE Performed at [facility] [address] [place], [place] [address]   Wet prep, genital     Status: Abnormal   Collection Time: 11/14/19  9:26 PM   Specimen: PATH Cytology Cervicovaginal Ancillary Only  Result Value Ref Range   Yeast Wet Prep HPF POC NONE SEEN NONE SEEN   Trich, Wet Prep NONE SEEN NONE SEEN   Clue Cells Wet Prep HPF POC PRESENT (A) NONE SEEN   WBC, Wet Prep HPF POC MANY (A) NONE SEEN   Sperm NONE SEEN    US OB Comp Less 14 Wks  Result Date: 11/14/2019 CLINICAL DATA:  Bleeding EXAM: OBSTETRIC <14 WK ULTRASOUND TECHNIQUE: Transabdominal ultrasound was performed for evaluation of the gestation as well as the maternal uterus and adnexal regions. COMPARISON:  None. FINDINGS: Intrauterine gestational sac: Single Yolk sac:  Not visualized Embryo:  Visualized Cardiac Activity: Visualized Heart Rate: 169 bpm MSD:    mm    w     d CRL:   62.3 mm   12 w 4 d                  [place] EDC: 05/24/2020 Subchorionic hemorrhage:  Small subchorionic hemorrhage. Maternal uterus/adnexae: No adnexal mass or free fluid. IMPRESSION: Twelve week 4 day intrauterine pregnancy. Fetal heart rate 169 beats per minute. No acute maternal findings. Electronically Signed   By: Rolm Baptise M.D.   On: 11/14/2019 21:47    MAU Course  Procedures  MDM -VB in setting of known IUP -CBC: WNL -CMP: K 3.3, GLU 122 -[place]: single IUP, FHR 169, [redacted]w[redacted]d [place]hCG 230,008 -ABO: A Positive -WetPrep: +ClueCells (isolated finding not requiring treatment) -GC/CT collected  Orders Placed This Encounter  Procedures  . Wet prep, genital    Standing Status:   Standing     Number of Occurrences:   1  . [place] Comp Less 14 Wks    Standing Status:   Standing    Number of Occurrences:   1    Order Specific Question:   Symptom/Reason for Exam    Answer:   Vaginal bleeding in pregnancy [[[id number]] . CBC    Standing Status:   Standing    Number of Occurrences:   1  . Comprehensive metabolic panel    Standing Status:   Standing    Number of Occurrences:   1  . hCG, quantitative, pregnancy    Standing Status:   Standing    Number of Occurrences:   1  . ABO/Rh    Standing Status:   Standing    Number of Occurrences:   1  . Discharge patient    Order Specific Question:   Discharge disposition    Answer:   01-Home or Self Care [1]    Order Specific Question:   Discharge patient date    Answer:   11/14/2019   Meds ordered this encounter  Medications  . potassium chloride (KLOR-CON)  10 MEQ tablet    Sig: Take 1 tablet (10 mEq total) by mouth daily.    Dispense:  7 tablet    Refill:  0    Order Specific Question:   Supervising Provider    Answer:   Verita Schneiders A [[id number]]    Assessment and Plan   1. Subchorionic hemorrhage of placenta in first trimester, single or unspecified fetus   2. Vaginal bleeding in pregnancy   3. Blood type, Rh positive   4. [redacted] weeks gestation of pregnancy   5. Hypokalemia     Allergies as of 11/14/2019      Reactions   Strawberry Extract Anaphylaxis   Latex Rash   Rash and sores      Medication List    TAKE these medications   Acetaminophen-Codeine 300-30 MG tablet Commonly known as: TYLENOL/CODEINE #3 Take 1 tablet by mouth every 4 (four) hours as needed for pain. For migraine   cyproheptadine 4 MG tablet Commonly known as: PERIACTIN Take 1 tablet (4 mg total) by mouth 3 (three) times daily as needed. For migraine   escitalopram 20 MG tablet Commonly known as: LEXAPRO Take 20 mg by mouth daily.   loratadine 10 MG tablet Commonly known as: CLARITIN Take 10 mg by mouth daily.   metoCLOPramide 10 MG  tablet Commonly known as: Reglan Take 1 tablet (10 mg total) by mouth 4 (four) times daily as needed for nausea. Or for migraine.   potassium chloride 10 MEQ tablet Commonly known as: KLOR-CON Take 1 tablet (10 mEq total) by mouth daily.   PRENATAL PO Take by mouth.       -will call with culture results, if positive -RX KDur -discussed expected s/sx of [facility] -return MAU precautions given -pt discharged to home in stable condition  Elmyra Ricks E Arilynn Blakeney 11/14/2019, 10:10 PM

## 2019-11-14 NOTE — Discharge Instructions (Signed)

## 2019-11-14 NOTE — Progress Notes (Signed)
Vernice Jefferson NP in earlier to discuss test results and d/c plan. Written and verbal d/c instructions given and understanding voiced

## 2019-11-14 NOTE — MAU Note (Signed)
Having VB since 1400 and stopped at 1545. Started about 1745 and have passed 2 small clots. No consistent pain. Have have "twinge" here and there. Blood is bright red. No recent intercourse

## 2019-11-14 NOTE — Progress Notes (Signed)
Swabs obtained by blind swab.

## 2019-11-15 LAB — GC/CHLAMYDIA PROBE AMP (~~LOC~~) NOT AT ARMC
Chlamydia: NEGATIVE
Comment: NEGATIVE
Comment: NORMAL
Neisseria Gonorrhea: NEGATIVE

## 2019-11-15 MED FILL — POTASSIUM CL ER 10 MEQ TAB: 10 | 7 days supply | Qty: 7 | Fill #0

## 2019-11-16 ENCOUNTER — Other Ambulatory Visit: Payer: Self-pay

## 2019-11-16 ENCOUNTER — Encounter (HOSPITAL_COMMUNITY): Payer: Self-pay | Admitting: Obstetrics and Gynecology

## 2019-11-16 ENCOUNTER — Inpatient Hospital Stay (HOSPITAL_COMMUNITY)
Admission: AD | Admit: 2019-11-16 | Discharge: 2019-11-16 | Disposition: A | Discharge status: Home / Self Care | Payer: 59 | Attending: Obstetrics and Gynecology | Admitting: Obstetrics and Gynecology

## 2019-11-16 DIAGNOSIS — O209 Hemorrhage in early pregnancy, unspecified: Secondary | ICD-10-CM | POA: Diagnosis present

## 2019-11-16 DIAGNOSIS — Z3A12 12 weeks gestation of pregnancy: Secondary | ICD-10-CM | POA: Diagnosis not present

## 2019-11-16 DIAGNOSIS — F329 Major depressive disorder, single episode, unspecified: Secondary | ICD-10-CM | POA: Insufficient documentation

## 2019-11-16 DIAGNOSIS — O208 Other hemorrhage in early pregnancy: Secondary | ICD-10-CM | POA: Diagnosis not present

## 2019-11-16 DIAGNOSIS — O99341 Other mental disorders complicating pregnancy, first trimester: Secondary | ICD-10-CM | POA: Insufficient documentation

## 2019-11-16 DIAGNOSIS — R109 Unspecified abdominal pain: Secondary | ICD-10-CM | POA: Diagnosis not present

## 2019-11-16 DIAGNOSIS — O468X1 Other antepartum hemorrhage, first trimester: Secondary | ICD-10-CM

## 2019-11-16 DIAGNOSIS — F419 Anxiety disorder, unspecified: Secondary | ICD-10-CM | POA: Insufficient documentation

## 2019-11-16 DIAGNOSIS — Z79899 Other long term (current) drug therapy: Secondary | ICD-10-CM | POA: Diagnosis not present

## 2019-11-16 DIAGNOSIS — O26891 Other specified pregnancy related conditions, first trimester: Secondary | ICD-10-CM | POA: Diagnosis not present

## 2019-11-16 DIAGNOSIS — O418X10 Other specified disorders of amniotic fluid and membranes, first trimester, not applicable or unspecified: Secondary | ICD-10-CM

## 2019-11-16 DIAGNOSIS — O09521 Supervision of elderly multigravida, first trimester: Secondary | ICD-10-CM | POA: Insufficient documentation

## 2019-11-16 NOTE — MAU Provider Note (Signed)
History     CSN: [id number]  Arrival date and time: 11/16/19 1409   First Provider Initiated Contact with Patient 11/16/19 1453      Chief Complaint  Patient presents with  . Abdominal Pain  . Back Pain  . Vaginal Bleeding   HPI Michele Mcbride is a 34 y.o. G3P2002 at 30w1dwho presents with vaginal bleeding and abdominal pain. She states she is seeing bright red bleeding when she wipes and passed 2 small clots today. She also reports lower abdominal cramping that is intermittent and she rates a 4/10. She was seen on 9/9 and diagnosed with a subchorionic hemorrhage.   OB History    Gravida  3   Para  2   Term  2   Preterm      AB      Living  2     SAB      TAB      Ectopic      Multiple      Live Births  2           Past Medical History:  Diagnosis Date  . Anxiety   . Asthma   . Depression   . Family history of breast cancer   . Migraine   . Monoallelic mutation of CHEK2 gene in female patient   . PCOS (polycystic ovarian syndrome)   . Sinus infection     Past Surgical History:  Procedure Laterality Date  . BREAST LUMPECTOMY WITH RADIOACTIVE SEED LOCALIZATION Bilateral 06/20/2019   Procedure: BILATERAL BREAST LUMPECTOMY WITH RADIOACTIVE SEED LOCALIZATION, Right x 2 and Left x1;  Surgeon: CErroll Luna MD;  [place] [facility]  Service: General;  Laterality: Bilateral;  . WISDOM TOOTH EXTRACTION      Family History  Problem Relation Age of Onset  . Hyperlipidemia Mother   . Neuropathy Mother   . Migraines Mother   . Breast cancer Mother 358 . Migraines Other        sisters, aunts, and grandmother had migraines on mother's side  . Breast cancer Paternal Aunt     Social History   Tobacco Use  . Smoking status: Never Smoker  . Smokeless tobacco: Never Used  Vaping Use  . Vaping Use: Never used  Substance Use Topics  . Alcohol use: No  . Drug use: No    Allergies:  Allergies  Allergen Reactions  . Strawberry  Extract Anaphylaxis  . Latex Rash    Rash and sores    Facility-Administered Medications Prior to Admission  Medication Dose Route Frequency Provider Last Rate Last Admin  . terbinafine (LAMISIL) 1 % cream   Topical BID Hedges, JDellis Filbert PA-C       Medications Prior to Admission  Medication Sig Dispense Refill Last Dose  . Acetaminophen-Codeine (TYLENOL/CODEINE #3) 300-30 MG tablet Take 1 tablet by mouth every 4 (four) hours as needed for pain. For migraine 30 tablet 0 Past Week at Unknown time  . cyproheptadine (PERIACTIN) 4 MG tablet Take 1 tablet (4 mg total) by mouth 3 (three) times daily as needed. For migraine 90 tablet 3 Past Week at Unknown time  . escitalopram (LEXAPRO) 20 MG tablet Take 20 mg by mouth daily.   Past Week at Unknown time  . loratadine (CLARITIN) 10 MG tablet Take 10 mg by mouth daily.   Past Week at Unknown time  . metoCLOPramide (REGLAN) 10 MG tablet Take 1 tablet (10 mg total) by mouth 4 (four) times daily  as needed for nausea. Or for migraine. 30 tablet 3 Past Week at Unknown time  . Prenatal Vit-Fe Fumarate-FA (PRENATAL PO) Take by mouth.   Past Week at Unknown time  . potassium chloride (KLOR-CON) 10 MEQ tablet Take 1 tablet (10 mEq total) by mouth daily. 7 tablet 0     Review of Systems  Constitutional: Negative.  Negative for fatigue and fever.  HENT: Negative.   Respiratory: Negative.  Negative for shortness of breath.   Cardiovascular: Negative.  Negative for chest pain.  Gastrointestinal: Positive for abdominal pain. Negative for constipation, diarrhea, nausea and vomiting.  Genitourinary: Positive for vaginal bleeding. Negative for dysuria.  Neurological: Negative.  Negative for dizziness and headaches.   Physical Exam   Blood pressure 112/87, pulse 87, temperature 98.8 F (37.1 C), temperature source Oral, resp. rate 16, height 5' 2" (1.575 m), weight 77.3 kg, SpO2 99 %.  Physical Exam Vitals and nursing note reviewed.  Constitutional:       General: She is not in acute distress.    Appearance: She is well-developed.  HENT:     Head: Normocephalic.  Eyes:     Pupils: Pupils are equal, round, and reactive to light.  Cardiovascular:     Rate and Rhythm: Normal rate and regular rhythm.     Heart sounds: Normal heart sounds.  Pulmonary:     Effort: Pulmonary effort is normal. No respiratory distress.     Breath sounds: Normal breath sounds.  Abdominal:     General: Bowel sounds are normal. There is no distension.     Palpations: Abdomen is soft.     Tenderness: There is no abdominal tenderness.  Genitourinary:    Comments: Scant bleeding on exam Skin:    General: Skin is warm and dry.  Neurological:     Mental Status: She is alert and oriented to person, place, and time.  Psychiatric:        Behavior: Behavior normal.        Thought Content: Thought content normal.        Judgment: Judgment normal.     MAU Course  Procedures  MDM A Pos Pt informed that the ultrasound is considered a limited OB ultrasound and is not intended to be a complete ultrasound exam.  Patient also informed that the ultrasound is not being completed with the intent of assessing for fetal or placental anomalies or any pelvic abnormalities.  Explained that the purpose of today's ultrasound is to assess for  viability. FHR 145bpm and fetal activity noted. Patient acknowledges the purpose of the exam and the limitations of the study.    Lengthy discussion with patient about subchorionic hemorrhages and what to expect with bleeding. Patient has appointment Tuesday in office for reassessment of bleeding. Discussed pelvic rest.  Assessment and Plan   1. Vaginal bleeding affecting early pregnancy   2. Subchorionic hematoma in first trimester, single or unspecified fetus   3. [redacted] weeks gestation of pregnancy    -Discharge home in stable condition -Bleeding and pain precautions discussed -Patient advised to follow-up with OB as scheduled on Tuesday  for prenatal care -Patient may return to MAU as needed or if her condition were to change or worsen   Wende Mott CNM 11/16/2019, 2:53 PM

## 2019-11-16 NOTE — MAU Note (Signed)
Michele Mcbride is a 34 y.o. at [redacted]w[redacted]d here in MAU reporting: has continued to bleed since previous MAU visit, states it has gotten heavier. Passed 1 larger clot today and some smaller ones between today and previous visit. Having some left sided abdominal and back pain.   Onset of complaint: ongoing but worse  Pain score: 4/10  Vitals:   11/16/19 1424  BP: 112/87  Pulse: 87  Resp: 16  Temp: 98.8 F (37.1 C)  SpO2: 99%     Lab orders placed from triage: UA

## 2019-11-16 NOTE — Discharge Instructions (Signed)
Safe Medications in Pregnancy   Acne: Benzoyl Peroxide Salicylic Acid  Backache/Headache: Tylenol: 2 regular strength every 4 hours OR              2 Extra strength every 6 hours  Colds/Coughs/Allergies: Benadryl (alcohol free) 25 mg every 6 hours as needed Breath right strips Claritin Cepacol throat lozenges Chloraseptic throat spray Cold-Eeze- up to three times per day Cough drops, alcohol free Flonase (by prescription only) Guaifenesin Mucinex Robitussin DM (plain only, alcohol free) Saline nasal spray/drops Sudafed (pseudoephedrine) & Actifed ** use only after [redacted] weeks gestation and if you do not have high blood pressure Tylenol Vicks Vaporub Zinc lozenges Zyrtec   Constipation: Colace Ducolax suppositories Fleet enema Glycerin suppositories Metamucil Milk of magnesia Miralax Senokot Smooth move tea  Diarrhea: Kaopectate Imodium A-D  *NO pepto Bismol  Hemorrhoids: Anusol Anusol HC Preparation H Tucks  Indigestion: Tums Maalox Mylanta Zantac  Pepcid  Insomnia: Benadryl (alcohol free) 25mg  every 6 hours as needed Tylenol PM Unisom, no Gelcaps  Leg Cramps: Tums MagGel  Nausea/Vomiting:  Bonine Dramamine Emetrol Ginger extract Sea bands Meclizine  Nausea medication to take during pregnancy:  Unisom (doxylamine succinate 25 mg tablets) Take one tablet daily at bedtime. If symptoms are not adequately controlled, the dose can be increased to a maximum recommended dose of two tablets daily (1/2 tablet in the morning, 1/2 tablet mid-afternoon and one at bedtime). Vitamin B6 100mg  tablets. Take one tablet twice a day (up to 200 mg per day).  Skin Rashes: Aveeno products Benadryl cream or 25mg  every 6 hours as needed Calamine Lotion 1% cortisone cream  Yeast infection: Gyne-lotrimin 7 Monistat 7   **If taking multiple medications, please check labels to avoid duplicating the same active ingredients **take medication as directed on  the label ** Do not exceed 4000 mg of tylenol in 24 hours **Do not take medications that contain aspirin or ibuprofen     Subchorionic Hematoma  A subchorionic hematoma is a gathering of blood between the outer wall of the embryo (chorion) and the inner wall of the womb (uterus). This condition can cause vaginal bleeding. If they cause little or no vaginal bleeding, early small hematomas usually shrink on their own and do not affect your baby or pregnancy. When bleeding starts later in pregnancy, or if the hematoma is larger or occurs in older pregnant women, the condition may be more serious. Larger hematomas may get bigger, which increases the chances of miscarriage. This condition also increases the risk of:  Premature separation of the placenta from the uterus.  Premature (preterm) labor.  Stillbirth. What are the causes? The exact cause of this condition is not known. It occurs when blood is trapped between the placenta and the uterine wall because the placenta has separated from the original site of implantation. What increases the risk? You are more likely to develop this condition if:  You were treated with fertility medicines.  You conceived through in vitro fertilization (IVF). What are the signs or symptoms? Symptoms of this condition include:  Vaginal spotting or bleeding.  Contractions of the uterus. These cause abdominal pain. Sometimes you may have no symptoms and the bleeding may only be seen when ultrasound images are taken (transvaginal ultrasound). How is this diagnosed? This condition is diagnosed based on a physical exam. This includes a pelvic exam. You may also have other tests, including:  Blood tests.  Urine tests.  Ultrasound of the abdomen. How is this treated? Treatment for this condition  can vary. Treatment may include:  Watchful waiting. You will be monitored closely for any changes in bleeding. During this stage: ? The hematoma may be  reabsorbed by the body. ? The hematoma may separate the fluid-filled space containing the embryo (gestational sac) from the wall of the womb (endometrium).  Medicines.  Activity restriction. This may be needed until the bleeding stops. Follow these instructions at home:  Stay on bed rest if told to do so by your health care provider.  Do not lift anything that is heavier than 10 lbs. (4.5 kg) or as told by your health care provider.  Do not use any products that contain nicotine or tobacco, such as cigarettes and e-cigarettes. If you need help quitting, ask your health care provider.  Track and write down the number of pads you use each day and how soaked (saturated) they are.  Do not use tampons.  Keep all follow-up visits as told by your health care provider. This is important. Your health care provider may ask you to have follow-up blood tests or ultrasound tests or both. Contact a health care provider if:  You have any vaginal bleeding.  You have a fever. Get help right away if:  You have severe cramps in your stomach, back, abdomen, or pelvis.  You pass large clots or tissue. Save any tissue for your health care provider to look at.  You have more vaginal bleeding, and you faint or become lightheaded or weak. Summary  A subchorionic hematoma is a gathering of blood between the outer wall of the placenta and the uterus.  This condition can cause vaginal bleeding.  Sometimes you may have no symptoms and the bleeding may only be seen when ultrasound images are taken.  Treatment may include watchful waiting, medicines, or activity restriction. This information is not intended to replace advice given to you by your health care provider. Make sure you discuss any questions you have with your health care provider. Document Revised: 02/03/2017 Document Reviewed: 04/19/2016 Elsevier Patient Education  2020 [employer].

## 2019-11-19 DIAGNOSIS — O209 Hemorrhage in early pregnancy, unspecified: Secondary | ICD-10-CM | POA: Diagnosis not present

## 2019-11-19 DIAGNOSIS — Z3A12 12 weeks gestation of pregnancy: Secondary | ICD-10-CM | POA: Diagnosis not present

## 2019-12-02 ENCOUNTER — Other Ambulatory Visit: Payer: Self-pay | Admitting: Neurology

## 2019-12-03 NOTE — Telephone Encounter (Signed)
Per [place] registry, last filled on 10/14/2019 Acetaminophen-Cod #3 Tablet #30. Will send request to Dr Jaynee Eagles.

## 2019-12-04 ENCOUNTER — Encounter: Payer: Self-pay | Admitting: Oncology

## 2019-12-06 ENCOUNTER — Telehealth: Payer: Self-pay | Admitting: Oncology

## 2019-12-06 MED FILL — ACETAMINOPHEN/COD #3 TABLET: 300-30 | 2 days supply | Qty: 20 | Fill #0

## 2019-12-06 NOTE — Telephone Encounter (Signed)
Scheduled appt per 10/1 sch msg - mailed letter with apt date and time

## 2019-12-09 MED FILL — METOCLOPRAMIDE 10 MG TABLET: 10 | 7 days supply | Qty: 30 | Fill #1

## 2019-12-16 ENCOUNTER — Other Ambulatory Visit (HOSPITAL_COMMUNITY): Payer: Self-pay | Admitting: Obstetrics and Gynecology

## 2019-12-16 DIAGNOSIS — O09522 Supervision of elderly multigravida, second trimester: Secondary | ICD-10-CM | POA: Diagnosis not present

## 2019-12-16 DIAGNOSIS — O09299 Supervision of pregnancy with other poor reproductive or obstetric history, unspecified trimester: Secondary | ICD-10-CM | POA: Diagnosis not present

## 2019-12-16 DIAGNOSIS — Z361 Encounter for antenatal screening for raised alphafetoprotein level: Secondary | ICD-10-CM | POA: Diagnosis not present

## 2019-12-16 DIAGNOSIS — Z3A16 16 weeks gestation of pregnancy: Secondary | ICD-10-CM | POA: Diagnosis not present

## 2019-12-16 MED FILL — FREESTYLE LANCETS: 25 days supply | Qty: 100 | Fill #0

## 2019-12-16 MED FILL — FREESTYLE LITE TEST STRIP: 25 days supply | Qty: 100 | Fill #0

## 2019-12-16 MED FILL — FREESTYLE LITE METER: W/DEVICE | 1 days supply | Qty: 1 | Fill #0

## 2019-12-16 MED FILL — SM ALCOHOL 70% PREP PADS: 70 | 25 days supply | Qty: 100 | Fill #0

## 2019-12-25 ENCOUNTER — Other Ambulatory Visit: Payer: Self-pay | Admitting: Obstetrics and Gynecology

## 2019-12-25 DIAGNOSIS — Z3A17 17 weeks gestation of pregnancy: Secondary | ICD-10-CM

## 2019-12-25 DIAGNOSIS — O280 Abnormal hematological finding on antenatal screening of mother: Secondary | ICD-10-CM

## 2019-12-25 DIAGNOSIS — O09522 Supervision of elderly multigravida, second trimester: Secondary | ICD-10-CM

## 2019-12-25 DIAGNOSIS — Z363 Encounter for antenatal screening for malformations: Secondary | ICD-10-CM

## 2019-12-26 ENCOUNTER — Other Ambulatory Visit: Payer: Self-pay

## 2019-12-26 ENCOUNTER — Encounter: Payer: Self-pay | Admitting: *Deleted

## 2019-12-26 ENCOUNTER — Ambulatory Visit (HOSPITAL_BASED_OUTPATIENT_CLINIC_OR_DEPARTMENT_OTHER): Payer: 59

## 2019-12-26 ENCOUNTER — Ambulatory Visit: Payer: 59 | Admitting: *Deleted

## 2019-12-26 ENCOUNTER — Ambulatory Visit: Payer: 59 | Attending: Obstetrics and Gynecology | Admitting: Genetic Counselor

## 2019-12-26 VITALS — BP 120/75 | HR 77

## 2019-12-26 DIAGNOSIS — O4692 Antepartum hemorrhage, unspecified, second trimester: Secondary | ICD-10-CM | POA: Diagnosis not present

## 2019-12-26 DIAGNOSIS — O321XX0 Maternal care for breech presentation, not applicable or unspecified: Secondary | ICD-10-CM | POA: Insufficient documentation

## 2019-12-26 DIAGNOSIS — O289 Unspecified abnormal findings on antenatal screening of mother: Secondary | ICD-10-CM | POA: Insufficient documentation

## 2019-12-26 DIAGNOSIS — O280 Abnormal hematological finding on antenatal screening of mother: Secondary | ICD-10-CM | POA: Diagnosis not present

## 2019-12-26 DIAGNOSIS — O99212 Obesity complicating pregnancy, second trimester: Secondary | ICD-10-CM | POA: Insufficient documentation

## 2019-12-26 DIAGNOSIS — O99512 Diseases of the respiratory system complicating pregnancy, second trimester: Secondary | ICD-10-CM | POA: Diagnosis not present

## 2019-12-26 DIAGNOSIS — J45909 Unspecified asthma, uncomplicated: Secondary | ICD-10-CM | POA: Insufficient documentation

## 2019-12-26 DIAGNOSIS — O09522 Supervision of elderly multigravida, second trimester: Secondary | ICD-10-CM

## 2019-12-26 DIAGNOSIS — R772 Abnormality of alphafetoprotein: Secondary | ICD-10-CM

## 2019-12-26 DIAGNOSIS — E669 Obesity, unspecified: Secondary | ICD-10-CM | POA: Diagnosis not present

## 2019-12-26 DIAGNOSIS — Z8632 Personal history of gestational diabetes: Secondary | ICD-10-CM | POA: Insufficient documentation

## 2019-12-26 DIAGNOSIS — Z315 Encounter for genetic counseling: Secondary | ICD-10-CM | POA: Diagnosis not present

## 2019-12-26 DIAGNOSIS — Z363 Encounter for antenatal screening for malformations: Secondary | ICD-10-CM

## 2019-12-26 DIAGNOSIS — Z3A17 17 weeks gestation of pregnancy: Secondary | ICD-10-CM | POA: Insufficient documentation

## 2019-12-26 DIAGNOSIS — O09529 Supervision of elderly multigravida, unspecified trimester: Secondary | ICD-10-CM

## 2019-12-26 DIAGNOSIS — O281 Abnormal biochemical finding on antenatal screening of mother: Secondary | ICD-10-CM

## 2019-12-26 NOTE — Progress Notes (Signed)
12/26/2019  LARENA OHNEMUS 04-08-1985 MRN: [id number] DOV: 12/26/2019  Ms. Prestwood presented to the [facility] for a genetics consultation regarding her increased MS-AFP results. Ms. Hemmerich was accompanied to her appointment by her sister Raquel Sarna.   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 Carbary is a G21P2002, 34 y.o. female. Her current pregnancy has completed [redacted]w[redacted]d(Estimated Date of Delivery: 05/29/20). Ms. HShimamotohas a 114year old daughter and a 957year old son from a prior relationship. The current pregnancy is the first for the couple.  Ms. HRilesdenied exposure to environmental toxins or chemical agents. She denied the use of alcohol, tobacco or street drugs. She reported taking prenatal vitamins, Lexapro, Claritin, Tylenol, and Reglan. She denied significant viral illnesses and fevers during the course of her pregnancy. She reported bleeding due to a subchorionic hematoma in the first trimester. She also was diagnosed with gestational diabetes early in this pregnancy. She reported a personal history of migraines, PCOS, and asthma. Her medical and surgical histories were otherwise noncontributory.  Family History  A three generation pedigree was drafted and reviewed. The family history is remarkable for the following:  - Ms. Salemi's mother had breast cancer at age 35 Ms. HNawazhad genetic testing performed and was found to be positive for a variant in the CHEK2 gene. This report was not available for my review. We discussed that pathogenic variants in the CHEK2 gene confer an increased risk for breast and colorectal cancer. There are also possible associations between CHEK2 and several other forms of cancer. Ms. HPherigois aware that her children all have a 50% chance of inheriting her CHEK2 mutation and possibly being at increased risk for cancer themselves. It is recommended that they have genetic counseling in  adulthood to discuss screening and testing options at that time.  - Ms. HKeaglehas a maternal half sister with a learning disability. Her developmental and medical histories are otherwise noncontributory. She also has a healthy son. We discussed that many times, learning disabilities are multifactorial in nature, occurring due to a combination of genetic and environmental factors that are difficult to identify. Learning disabilities can appear to run in families; thus, there is a chance that Ms. Galvis's children could also experience learning difficulties of some kind. She understands that she should make the pediatrician aware of any concerns she has about her children's development.  - Ms. HGoransonhas a maternal aunt who has had six miscarriages. She also has one healthy daughter. We reviewed that there are many potential causes of recurrent miscarriages, including anatomic, immunologic, endocrine, and genetic factors. However, a cause is only identified in 50% of individuals who experience recurrent miscarriages. We discussed that 2-5% of individuals who experience multiple miscarriages carry a balanced translocation that can become unbalanced and potentially lethal in their offspring. In addition to chromosomal abnormalities, certain single gene, X-linked, and polygenic/multifactorial disorders are associated with recurrent miscarriage. Without knowing the cause of Ms. Bogus's aunt's miscarriages, risk assessment is limited.  - Ms. HRicciardellihas a paternal family history of muscular dystrophy. Her paternal uncle, paternal aunt, and her two of her aunt's children all have muscular dystrophy. Ms. HYuhaszwas unsure of the precise type of muscular dystrophy that her relatives have. We discussed that there are several types of muscular dystrophy which display multiple inheritance patterns. Based on Ms. Welge's family history, it appears that her relatives may be affected by a dominant  form of muscular dystrophy.  Since neither Ms. Real nor her father have displayed any symptoms, the risk of recurrence for Ms. Loja's children is likely low. However, without knowing the exact type/cause of muscular dystrophy in the family, precise risk assessment is limited.   The remaining family histories were reviewed and found to be noncontributory for birth defects, intellectual disability, recurrent pregnancy loss, and known genetic conditions. Ms. Kohls had limited information about her partner's family history; thus, risk assessment was limited.  The patient's ancestry is [place]. The father of the pregnancy's ancestry is African American. Ashkenazi Jewish ancestry and consanguinity were denied. Pedigree will be scanned under Media.  Discussion  MS-AFP results:  Ms. Arcos was referred to genetic counseling for an increased risk for an open neural tube defect (ONTD) on MS-AFP screening. Results of the screen indicated that the current fetus has a 1 in 32 (1.5%) risk to be affected with an ONTD such as spina bifida.   We reviewed these MS-AFP results in detail. We discussed that there are many explanations for an elevated AFP result, including twin pregnancies, dating errors, ONTDs such as anencephaly or spina bifida, abdominal wall defects, placental abnormalities, fetal growth restriction, adverse obstetrical outcomes (oligohydramnios, placental abruption, intrauterine fetal demise/stillbirth, preterm delivery, premature rupture of membranes, or preeclampsia), or a normal variant. There also have been reports of an association between increased MS-AFP and first trimester vaginal bleeding, which our patient did report.   Ms. Retz had her anatomy ultrasound performed following her genetic counseling appointment. The ultrasound report will be sent under separate cover. There were no visualized fetal anomalies or markers suggestive of aneuploidy. Anatomy ultrasound also did not detect twins, ONTDs, abdominal wall  defects, placental abnormalities, or oligohydramnios. Dating for the pregnancy is based on ultrasound, so a dating error is not likely to be the cause for the elevated AFP result. In light of a normal anatomy ultrasound, it is possible that an explanation may not be found for Ms. Nowland's elevated MS-AFP. Given this unexplained elevated AFP level, the pregnancy will be monitored more closely for fetal growth restriction and maternal hypertension.   Aneuploidy screening results:  We also reviewed that Ms. Vallas had [facility] NIPS through the laboratory [facility] that was low-risk for fetal aneuploidies. We reviewed that these results showed a less than 1 in 10,000 risk for trisomies 21, 18 and 13, and monosomy X (Turner syndrome).  In addition, the risk for triploidy and sex chromosome trisomies (47,XXX and 47,XXY) was also low. Ms. Silberstein elected to have cfDNA analysis for 22q11.2 deletion syndrome, which was also low risk (1 in 9000). We reviewed that while this testing identifies 94-99% of pregnancies with trisomy 15, trisomy 68, trisomy 73, sex chromosome aneuploidies, and triploidy, it is NOT diagnostic. A positive test result requires confirmation by CVS or amniocentesis, and a negative test result does not rule out a fetal chromosome abnormality. She also understands that this testing does not identify all genetic conditions.  Carrier screening:  Per ACOG recommendation, carrier screening for hemoglobinopathies, cystic fibrosis (CF) and spinal muscular atrophy (SMA) was discussed including information about the conditions, rationale for testing, autosomal recessive inheritance, and the option of prenatal diagnosis. We also discussed that carrier screening can be performed for up to hundreds of more conditions via expanded carrier screening (ECS). Ms. Luepke declined carrier screening at this time but informed me that she may be interested in pursuing ECS at a future appointment. She preferred to focus on  determining a  possible cause for her increased MS-AFP result before making a decision regarding carrier screening. Ms. Eble was counseled that select hemoglobinopathies, CF, and SMA are included on [facility]'s newborn screen.   Diagnostic testing:  Ms. Duell was also counseled regarding diagnostic testing via amniocentesis. We discussed the technical aspects of the procedure and quoted up to a 1 in 500 (0.2%) risk for spontaneous pregnancy loss or other adverse pregnancy outcomes as a result of amniocentesis. Cultured cells from an amniocentesis sample allow for the visualization of a fetal karyotype, which can detect >99% of large chromosomal aberrations. Chromosomal microarray can also be performed to identify smaller deletions or duplications of fetal chromosomal material. Amniocentesis could also be performed to assess whether the babyis affected by an ONTDvia analysis of AF-AFPand acetylcholinesterase.  After careful consideration, Ms. Wassink declined amniocentesis at this time. She understands that amniocentesis is available at any point after 16 weeks of pregnancy and that she may opt to undergo the procedure at a later date should she change her mind.  Plan:  Additional screening and diagnostic testing were declined today. She understands that screening tests, including ultrasound, cannot rule out all birth defects or genetic syndromes. Ms. Lovins will return for a fetal growth ultrasound in four weeks. She may opt to pursue additional testing such as ECS at that time.  I counseled Ms. Grammatico regarding the above risks and available options. I provided her with a patient information sheet on ECS from the [employer]. The approximate face-to-face time with the genetic counselor was 50 minutes.  In summary:  Discussed MS-AFP results and options for follow-up testing  Elevated MS-AFP (MoM3.35,1 in67risk for open neural tube defects)  Discussed reasons  for receiving this type of result, including fetal growth restriction, first trimester bleeding, and adverse obstetrical outcomes  Reviewed results of ultrasound  No evidence of open neural tube defect or abdominal wall defect  Reviewed low-risk NIPS result  Reduction in risk for Down syndrome, trisomy 6, trisomy 58, triploidy, sex chromosome aneuploidies, and 22q11.2 deletion syndrome  Offered additional testing and screening  Declined amniocentesis  May elect expanded carrier screening at future appointment  Reviewed family history concerns   Buelah Manis, MS, [facility] Dietitian

## 2019-12-27 ENCOUNTER — Other Ambulatory Visit: Payer: Self-pay | Admitting: *Deleted

## 2019-12-27 DIAGNOSIS — O09529 Supervision of elderly multigravida, unspecified trimester: Secondary | ICD-10-CM

## 2020-01-02 ENCOUNTER — Ambulatory Visit: Payer: 59

## 2020-01-06 DIAGNOSIS — O351XX0 Maternal care for (suspected) chromosomal abnormality in fetus, not applicable or unspecified: Secondary | ICD-10-CM | POA: Diagnosis not present

## 2020-01-06 DIAGNOSIS — O09292 Supervision of pregnancy with other poor reproductive or obstetric history, second trimester: Secondary | ICD-10-CM | POA: Diagnosis not present

## 2020-01-06 DIAGNOSIS — Z3A19 19 weeks gestation of pregnancy: Secondary | ICD-10-CM | POA: Diagnosis not present

## 2020-01-06 DIAGNOSIS — O09522 Supervision of elderly multigravida, second trimester: Secondary | ICD-10-CM | POA: Diagnosis not present

## 2020-01-10 ENCOUNTER — Other Ambulatory Visit (HOSPITAL_COMMUNITY): Payer: Self-pay | Admitting: Obstetrics and Gynecology

## 2020-01-10 MED FILL — METFORMIN HCL 500 MG TABS: 500 | 37 days supply | Qty: 90 | Fill #0

## 2020-01-13 ENCOUNTER — Other Ambulatory Visit (HOSPITAL_COMMUNITY): Payer: Self-pay | Admitting: Obstetrics and Gynecology

## 2020-01-13 DIAGNOSIS — M5489 Other dorsalgia: Secondary | ICD-10-CM | POA: Diagnosis not present

## 2020-01-13 MED FILL — ACETAMINOPHEN/COD #3 TABLET: 300-30 | 2 days supply | Qty: 20 | Fill #0

## 2020-01-16 MED FILL — FREESTYLE LITE TEST STRIP: 25 days supply | Qty: 100 | Fill #1

## 2020-01-16 MED FILL — FREESTYLE LANCETS: 25 days supply | Qty: 100 | Fill #1

## 2020-01-20 ENCOUNTER — Ambulatory Visit: Payer: 59 | Admitting: Family Medicine

## 2020-01-24 ENCOUNTER — Ambulatory Visit: Payer: 59 | Admitting: *Deleted

## 2020-01-24 ENCOUNTER — Other Ambulatory Visit: Payer: Self-pay

## 2020-01-24 ENCOUNTER — Ambulatory Visit: Payer: 59 | Attending: Obstetrics and Gynecology

## 2020-01-24 ENCOUNTER — Encounter: Payer: Self-pay | Admitting: *Deleted

## 2020-01-24 VITALS — BP 112/75 | HR 75

## 2020-01-24 DIAGNOSIS — O09292 Supervision of pregnancy with other poor reproductive or obstetric history, second trimester: Secondary | ICD-10-CM | POA: Diagnosis not present

## 2020-01-24 DIAGNOSIS — O99512 Diseases of the respiratory system complicating pregnancy, second trimester: Secondary | ICD-10-CM

## 2020-01-24 DIAGNOSIS — O289 Unspecified abnormal findings on antenatal screening of mother: Secondary | ICD-10-CM | POA: Diagnosis not present

## 2020-01-24 DIAGNOSIS — O99212 Obesity complicating pregnancy, second trimester: Secondary | ICD-10-CM | POA: Diagnosis not present

## 2020-01-24 DIAGNOSIS — E669 Obesity, unspecified: Secondary | ICD-10-CM | POA: Diagnosis not present

## 2020-01-24 DIAGNOSIS — O09522 Supervision of elderly multigravida, second trimester: Secondary | ICD-10-CM

## 2020-01-24 DIAGNOSIS — J45909 Unspecified asthma, uncomplicated: Secondary | ICD-10-CM

## 2020-01-24 DIAGNOSIS — O09529 Supervision of elderly multigravida, unspecified trimester: Secondary | ICD-10-CM | POA: Diagnosis not present

## 2020-01-24 DIAGNOSIS — O24415 Gestational diabetes mellitus in pregnancy, controlled by oral hypoglycemic drugs: Secondary | ICD-10-CM

## 2020-01-24 DIAGNOSIS — Z3A22 22 weeks gestation of pregnancy: Secondary | ICD-10-CM

## 2020-01-24 DIAGNOSIS — O321XX0 Maternal care for breech presentation, not applicable or unspecified: Secondary | ICD-10-CM

## 2020-01-24 NOTE — Progress Notes (Signed)
C/o"vaginal pressure"

## 2020-02-11 MED FILL — CHLORHEXIDINE 0.12% RINSE: 0.12 | 14 days supply | Qty: 473 | Fill #0

## 2020-02-20 DIAGNOSIS — O289 Unspecified abnormal findings on antenatal screening of mother: Secondary | ICD-10-CM | POA: Diagnosis not present

## 2020-02-20 DIAGNOSIS — Z3A25 25 weeks gestation of pregnancy: Secondary | ICD-10-CM | POA: Diagnosis not present

## 2020-02-20 DIAGNOSIS — O09522 Supervision of elderly multigravida, second trimester: Secondary | ICD-10-CM | POA: Diagnosis not present

## 2020-02-27 MED FILL — METFORMIN HCL 500 MG TABS: 500 | 30 days supply | Qty: 90 | Fill #1

## 2020-02-29 ENCOUNTER — Other Ambulatory Visit (HOSPITAL_COMMUNITY): Payer: Self-pay | Admitting: Obstetrics and Gynecology

## 2020-03-02 DIAGNOSIS — Z3689 Encounter for other specified antenatal screening: Secondary | ICD-10-CM | POA: Diagnosis not present

## 2020-03-02 DIAGNOSIS — E119 Type 2 diabetes mellitus without complications: Secondary | ICD-10-CM | POA: Diagnosis not present

## 2020-03-02 DIAGNOSIS — Z3A27 27 weeks gestation of pregnancy: Secondary | ICD-10-CM | POA: Diagnosis not present

## 2020-03-02 DIAGNOSIS — Z362 Encounter for other antenatal screening follow-up: Secondary | ICD-10-CM | POA: Diagnosis not present

## 2020-03-02 DIAGNOSIS — O09522 Supervision of elderly multigravida, second trimester: Secondary | ICD-10-CM | POA: Diagnosis not present

## 2020-03-02 MED FILL — ESCITALOPRAM 20 MG TABLET: 20 | 90 days supply | Qty: 90 | Fill #1

## 2020-03-02 MED FILL — ACETAMINOPHEN/COD #3 TABLET: 300-30 | 2 days supply | Qty: 20 | Fill #0

## 2020-03-07 NOTE — L&D Delivery Note (Signed)
Delivery Note At 6:27 PM a viable and healthy female was delivered via Vaginal, Spontaneous (Presentation: Left Occiput Anterior).  APGAR: 8, 9; weight pending .   Placenta status: Spontaneous, Intact.  Cord: 3 vessels with the following complications: none .  Cord pH: na  Anesthesia: Epidural Episiotomy: None Lacerations: None Suture Repair: na Est. Blood Loss (mL): 150  Mom to postpartum.  Baby to Couplet care / Skin to Skin.  Necola Bluestein J 05/14/2020, 7:07 PM

## 2020-03-16 DIAGNOSIS — E119 Type 2 diabetes mellitus without complications: Secondary | ICD-10-CM | POA: Diagnosis not present

## 2020-03-16 DIAGNOSIS — Z3A29 29 weeks gestation of pregnancy: Secondary | ICD-10-CM | POA: Diagnosis not present

## 2020-03-16 DIAGNOSIS — O09523 Supervision of elderly multigravida, third trimester: Secondary | ICD-10-CM | POA: Diagnosis not present

## 2020-03-16 DIAGNOSIS — O09522 Supervision of elderly multigravida, second trimester: Secondary | ICD-10-CM | POA: Diagnosis not present

## 2020-03-16 DIAGNOSIS — O24113 Pre-existing diabetes mellitus, type 2, in pregnancy, third trimester: Secondary | ICD-10-CM | POA: Diagnosis not present

## 2020-04-01 DIAGNOSIS — O09523 Supervision of elderly multigravida, third trimester: Secondary | ICD-10-CM | POA: Diagnosis not present

## 2020-04-01 DIAGNOSIS — E119 Type 2 diabetes mellitus without complications: Secondary | ICD-10-CM | POA: Diagnosis not present

## 2020-04-01 DIAGNOSIS — Z3A31 31 weeks gestation of pregnancy: Secondary | ICD-10-CM | POA: Diagnosis not present

## 2020-04-01 DIAGNOSIS — O09522 Supervision of elderly multigravida, second trimester: Secondary | ICD-10-CM | POA: Diagnosis not present

## 2020-04-06 DIAGNOSIS — E282 Polycystic ovarian syndrome: Secondary | ICD-10-CM | POA: Diagnosis not present

## 2020-04-06 DIAGNOSIS — O24113 Pre-existing diabetes mellitus, type 2, in pregnancy, third trimester: Secondary | ICD-10-CM | POA: Diagnosis not present

## 2020-04-06 DIAGNOSIS — Z3A32 32 weeks gestation of pregnancy: Secondary | ICD-10-CM | POA: Diagnosis not present

## 2020-04-06 DIAGNOSIS — O09523 Supervision of elderly multigravida, third trimester: Secondary | ICD-10-CM | POA: Diagnosis not present

## 2020-04-08 MED FILL — FREESTYLE LANCETS: 25 days supply | Qty: 100 | Fill #2

## 2020-04-08 MED FILL — FREESTYLE LITE TEST STRIP: 25 days supply | Qty: 100 | Fill #2

## 2020-04-09 ENCOUNTER — Other Ambulatory Visit (HOSPITAL_COMMUNITY): Payer: Self-pay | Admitting: Obstetrics and Gynecology

## 2020-04-09 MED FILL — ACETAMINOPHEN/COD #3 TABLET: 300-30 | 2 days supply | Qty: 20 | Fill #0

## 2020-04-09 MED FILL — METFORMIN HCL 500 MG TABS: 500 | 30 days supply | Qty: 90 | Fill #0

## 2020-04-10 LAB — OB RESULTS CONSOLE GBS: GBS: NEGATIVE

## 2020-04-13 DIAGNOSIS — Z3A33 33 weeks gestation of pregnancy: Secondary | ICD-10-CM | POA: Diagnosis not present

## 2020-04-13 DIAGNOSIS — O24113 Pre-existing diabetes mellitus, type 2, in pregnancy, third trimester: Secondary | ICD-10-CM | POA: Diagnosis not present

## 2020-04-13 DIAGNOSIS — E282 Polycystic ovarian syndrome: Secondary | ICD-10-CM | POA: Diagnosis not present

## 2020-04-13 DIAGNOSIS — O09523 Supervision of elderly multigravida, third trimester: Secondary | ICD-10-CM | POA: Diagnosis not present

## 2020-04-17 ENCOUNTER — Telehealth: Payer: 59 | Admitting: Family

## 2020-04-17 DIAGNOSIS — N898 Other specified noninflammatory disorders of vagina: Secondary | ICD-10-CM

## 2020-04-17 DIAGNOSIS — Z3490 Encounter for supervision of normal pregnancy, unspecified, unspecified trimester: Secondary | ICD-10-CM

## 2020-04-17 NOTE — Progress Notes (Signed)
Based on what you shared with me, I feel your condition warrants further evaluation and I recommend that you be seen for a face to face office visit.  Given you are having vaginal discharge and are pregnant you need to be seen face to face. I recommend calling your GYN and letting them know your symptoms.    NOTE: If you entered your credit card information for this eVisit, you will not be charged. You may see a "hold" on your card for the $35 but that hold will drop off and you will not have a charge processed.   If you are having a true medical emergency please call 911.      For an urgent face to face visit, [place] has five urgent care centers for your convenience:     [facility] Get Driving Directions [phone] [address] [place], [place] [address] . 10 am - 6pm Monday - Friday    [facility]) Get Driving Directions [phone] [address] [place], [place] [address] . 10 am to 8 pm Monday-Friday . 12 pm to 8 pm [facility] Get Driving Directions [phone] [address], [place], [place] [address] . 8 am to 8 pm Monday-Friday . 9 am to 6 pm Saturday . 11 am to 6 pm Sunday     [facility] Get Driving Directions  [phone] [address], [place] [address] . 8 am to 8 pm Monday-Friday . 8 am to 4 pm [facility] [place] Get Driving Directions [phone] [address] [place], [place] [address] . 12 pm to 6 pm Monday-Friday      Your e-visit answers were reviewed by a board certified advanced clinical practitioner to complete your personal care plan.  Thank you for using e-Visits.

## 2020-04-22 DIAGNOSIS — Z3483 Encounter for supervision of other normal pregnancy, third trimester: Secondary | ICD-10-CM | POA: Diagnosis not present

## 2020-04-22 DIAGNOSIS — Z3482 Encounter for supervision of other normal pregnancy, second trimester: Secondary | ICD-10-CM | POA: Diagnosis not present

## 2020-04-23 DIAGNOSIS — E282 Polycystic ovarian syndrome: Secondary | ICD-10-CM | POA: Diagnosis not present

## 2020-04-23 DIAGNOSIS — O24113 Pre-existing diabetes mellitus, type 2, in pregnancy, third trimester: Secondary | ICD-10-CM | POA: Diagnosis not present

## 2020-04-23 DIAGNOSIS — O09523 Supervision of elderly multigravida, third trimester: Secondary | ICD-10-CM | POA: Diagnosis not present

## 2020-04-23 DIAGNOSIS — Z3A34 34 weeks gestation of pregnancy: Secondary | ICD-10-CM | POA: Diagnosis not present

## 2020-04-27 DIAGNOSIS — O403XX0 Polyhydramnios, third trimester, not applicable or unspecified: Secondary | ICD-10-CM | POA: Diagnosis not present

## 2020-04-27 DIAGNOSIS — Z3A35 35 weeks gestation of pregnancy: Secondary | ICD-10-CM | POA: Diagnosis not present

## 2020-04-30 DIAGNOSIS — O09523 Supervision of elderly multigravida, third trimester: Secondary | ICD-10-CM | POA: Diagnosis not present

## 2020-04-30 DIAGNOSIS — O24113 Pre-existing diabetes mellitus, type 2, in pregnancy, third trimester: Secondary | ICD-10-CM | POA: Diagnosis not present

## 2020-04-30 DIAGNOSIS — Z3689 Encounter for other specified antenatal screening: Secondary | ICD-10-CM | POA: Diagnosis not present

## 2020-04-30 DIAGNOSIS — O99019 Anemia complicating pregnancy, unspecified trimester: Secondary | ICD-10-CM | POA: Diagnosis not present

## 2020-04-30 DIAGNOSIS — Z3A35 35 weeks gestation of pregnancy: Secondary | ICD-10-CM | POA: Diagnosis not present

## 2020-04-30 DIAGNOSIS — E282 Polycystic ovarian syndrome: Secondary | ICD-10-CM | POA: Diagnosis not present

## 2020-04-30 DIAGNOSIS — Z3685 Encounter for antenatal screening for Streptococcus B: Secondary | ICD-10-CM | POA: Diagnosis not present

## 2020-05-01 ENCOUNTER — Encounter (HOSPITAL_COMMUNITY): Payer: Self-pay | Admitting: Obstetrics and Gynecology

## 2020-05-01 ENCOUNTER — Inpatient Hospital Stay (HOSPITAL_COMMUNITY)
Admission: AD | Admit: 2020-05-01 | Discharge: 2020-05-02 | Disposition: A | Discharge status: Home / Self Care | Payer: 59 | Attending: Obstetrics and Gynecology | Admitting: Obstetrics and Gynecology

## 2020-05-01 DIAGNOSIS — O479 False labor, unspecified: Secondary | ICD-10-CM

## 2020-05-01 DIAGNOSIS — Z3689 Encounter for other specified antenatal screening: Secondary | ICD-10-CM

## 2020-05-01 DIAGNOSIS — Z3A36 36 weeks gestation of pregnancy: Secondary | ICD-10-CM | POA: Insufficient documentation

## 2020-05-01 DIAGNOSIS — O4703 False labor before 37 completed weeks of gestation, third trimester: Secondary | ICD-10-CM | POA: Insufficient documentation

## 2020-05-01 NOTE — MAU Note (Signed)
PT SAYS STRONG UC SINCE 7PM- IN OFFICE YESTERDAY - VE  2-3 CM. DENIES HSV AND MRSA. GBS- COLLECTED YESTERDAY .

## 2020-05-02 DIAGNOSIS — O4703 False labor before 37 completed weeks of gestation, third trimester: Secondary | ICD-10-CM

## 2020-05-02 DIAGNOSIS — Z3689 Encounter for other specified antenatal screening: Secondary | ICD-10-CM | POA: Diagnosis not present

## 2020-05-02 DIAGNOSIS — Z3A36 36 weeks gestation of pregnancy: Secondary | ICD-10-CM

## 2020-05-02 NOTE — Discharge Instructions (Signed)

## 2020-05-02 NOTE — MAU Provider Note (Signed)
S: Michele Mcbride is a 35 y.o. G3P2002 at [redacted]w[redacted]d  who presents to MAU today for labor evaluation.     Cervical exam by RN: no cervical change after 1.5 hours  Dilation: 1.5 Effacement (%): 60 Cervical Position: Posterior Station: Ballotable Presentation: Vertex Exam by:: DCALLAWAY, RN  Fetal Monitoring: Baseline: 130 Variability: moderate Accelerations: present  Decelerations: none  Contractions: 3-4/mild by palpation   MDM Discussed patient with RN. NST reviewed.   A: SIUP at [redacted]w[redacted]d  False labor  P: Discharge home Labor precautions and kick counts included in AVS Patient to follow-up with [facility] as scheduled  Patient may return to MAU as needed or when in labor   Lajean Manes, [place] 05/02/2020 12:16 AM

## 2020-05-04 DIAGNOSIS — Z3A36 36 weeks gestation of pregnancy: Secondary | ICD-10-CM | POA: Diagnosis not present

## 2020-05-04 DIAGNOSIS — O24113 Pre-existing diabetes mellitus, type 2, in pregnancy, third trimester: Secondary | ICD-10-CM | POA: Diagnosis not present

## 2020-05-04 DIAGNOSIS — O09523 Supervision of elderly multigravida, third trimester: Secondary | ICD-10-CM | POA: Diagnosis not present

## 2020-05-06 ENCOUNTER — Encounter (HOSPITAL_COMMUNITY): Payer: Self-pay | Admitting: *Deleted

## 2020-05-06 ENCOUNTER — Telehealth (HOSPITAL_COMMUNITY): Payer: Self-pay | Admitting: *Deleted

## 2020-05-06 NOTE — Telephone Encounter (Signed)
Preadmission screen  

## 2020-05-07 DIAGNOSIS — Z3A36 36 weeks gestation of pregnancy: Secondary | ICD-10-CM | POA: Diagnosis not present

## 2020-05-07 DIAGNOSIS — O09523 Supervision of elderly multigravida, third trimester: Secondary | ICD-10-CM | POA: Diagnosis not present

## 2020-05-07 DIAGNOSIS — O24113 Pre-existing diabetes mellitus, type 2, in pregnancy, third trimester: Secondary | ICD-10-CM | POA: Diagnosis not present

## 2020-05-10 ENCOUNTER — Inpatient Hospital Stay (EMERGENCY_DEPARTMENT_HOSPITAL)
Admission: AD | Admit: 2020-05-10 | Discharge: 2020-05-11 | Disposition: A | Discharge status: Home / Self Care | Payer: 59 | Source: Home / Self Care | Attending: Obstetrics and Gynecology | Admitting: Obstetrics and Gynecology

## 2020-05-10 ENCOUNTER — Other Ambulatory Visit: Payer: Self-pay

## 2020-05-10 DIAGNOSIS — Z3A37 37 weeks gestation of pregnancy: Secondary | ICD-10-CM

## 2020-05-10 DIAGNOSIS — O471 False labor at or after 37 completed weeks of gestation: Secondary | ICD-10-CM | POA: Insufficient documentation

## 2020-05-10 DIAGNOSIS — O479 False labor, unspecified: Secondary | ICD-10-CM

## 2020-05-10 DIAGNOSIS — Z0371 Encounter for suspected problem with amniotic cavity and membrane ruled out: Secondary | ICD-10-CM

## 2020-05-10 DIAGNOSIS — Z3689 Encounter for other specified antenatal screening: Secondary | ICD-10-CM

## 2020-05-10 NOTE — MAU Note (Signed)
Pt reports feeling a trickle of fluid at 2215. Pt reports wearing a panty liner.   Pt reports the baby is moving, but has been decreased today pt reports it was her sons birthday party and was not actively monitoring the baby's movement.   Denies vaginal bleeding.

## 2020-05-11 ENCOUNTER — Encounter (HOSPITAL_COMMUNITY): Payer: Self-pay | Admitting: Obstetrics and Gynecology

## 2020-05-11 DIAGNOSIS — Z3689 Encounter for other specified antenatal screening: Secondary | ICD-10-CM | POA: Diagnosis not present

## 2020-05-11 DIAGNOSIS — O09523 Supervision of elderly multigravida, third trimester: Secondary | ICD-10-CM | POA: Diagnosis not present

## 2020-05-11 DIAGNOSIS — Z3A37 37 weeks gestation of pregnancy: Secondary | ICD-10-CM | POA: Diagnosis not present

## 2020-05-11 DIAGNOSIS — O24425 Gestational diabetes mellitus in childbirth, controlled by oral hypoglycemic drugs: Secondary | ICD-10-CM | POA: Diagnosis not present

## 2020-05-11 DIAGNOSIS — O99344 Other mental disorders complicating childbirth: Secondary | ICD-10-CM | POA: Diagnosis not present

## 2020-05-11 DIAGNOSIS — Z20822 Contact with and (suspected) exposure to covid-19: Secondary | ICD-10-CM | POA: Diagnosis not present

## 2020-05-11 DIAGNOSIS — Z0371 Encounter for suspected problem with amniotic cavity and membrane ruled out: Secondary | ICD-10-CM

## 2020-05-11 DIAGNOSIS — O2412 Pre-existing diabetes mellitus, type 2, in childbirth: Secondary | ICD-10-CM | POA: Diagnosis not present

## 2020-05-11 DIAGNOSIS — F32A Depression, unspecified: Secondary | ICD-10-CM | POA: Diagnosis not present

## 2020-05-11 DIAGNOSIS — J45909 Unspecified asthma, uncomplicated: Secondary | ICD-10-CM | POA: Diagnosis not present

## 2020-05-11 DIAGNOSIS — O24113 Pre-existing diabetes mellitus, type 2, in pregnancy, third trimester: Secondary | ICD-10-CM | POA: Diagnosis not present

## 2020-05-11 DIAGNOSIS — Z3480 Encounter for supervision of other normal pregnancy, unspecified trimester: Secondary | ICD-10-CM | POA: Diagnosis not present

## 2020-05-11 DIAGNOSIS — O403XX0 Polyhydramnios, third trimester, not applicable or unspecified: Secondary | ICD-10-CM | POA: Diagnosis not present

## 2020-05-11 DIAGNOSIS — O471 False labor at or after 37 completed weeks of gestation: Secondary | ICD-10-CM | POA: Diagnosis not present

## 2020-05-11 DIAGNOSIS — F419 Anxiety disorder, unspecified: Secondary | ICD-10-CM | POA: Diagnosis not present

## 2020-05-11 DIAGNOSIS — O9952 Diseases of the respiratory system complicating childbirth: Secondary | ICD-10-CM | POA: Diagnosis not present

## 2020-05-11 DIAGNOSIS — Z3A38 38 weeks gestation of pregnancy: Secondary | ICD-10-CM | POA: Diagnosis not present

## 2020-05-11 LAB — WET PREP, GENITAL
Clue Cells Wet Prep HPF POC: NONE SEEN
Sperm: NONE SEEN
Trich, Wet Prep: NONE SEEN
Yeast Wet Prep HPF POC: NONE SEEN

## 2020-05-11 LAB — POCT FERN TEST: POCT Fern Test: NEGATIVE

## 2020-05-11 NOTE — MAU Provider Note (Signed)
S: [name] is a 35 y.o. G3P2002 at [redacted]w[redacted]d  who presents to MAU today complaining of leaking of fluid since 2200. She reports trickling and having to change her panty liner. She denies vaginal bleeding. She endorses contractions. She reports normal fetal movement.    O: BP 101/70   Pulse 97   Temp 98.1 F (36.7 C) (Oral)   Resp 20   Wt 82.6 kg   SpO2 100%   BMI 33.32 kg/m  GENERAL: Well-developed, well-nourished female in no acute distress.  HEAD: Normocephalic, atraumatic.  CHEST: Normal effort of breathing, regular heart rate ABDOMEN: Soft, nontender, gravid PELVIC: Normal external female genitalia. Vagina is pink and rugated. Cervix with normal contour, no lesions. Moderate amount of white thin discharge without odor.  Negative pooling.   Cervical exam:  Dilation: 3 Effacement (%): 60 Station: -2 Exam by:: Darrol Poke, CNM  Negative fern. Cervix 3cm at initial examination- was 2cm in office last week per patient. Plan to recheck cervix around 0215.   Cervix recheck by RN at [facility] reports slight cervical change and patient continues to contract regularly, plan to watch for another hour and recheck at that time.   Cervix rechecked around 0340 - no cervical change from initial examination. Discussed with patient labor precautions and reasons to return to MAU. Follow up as scheduled in the office. Return to MAU as needed. Pt stable at time of discharge.    Fetal Monitoring: Baseline: 140 Variability: moderate Accelerations: present  Decelerations: one variable  Contractions: 2-3/ mild by palpation   Results for orders placed or performed during the hospital encounter of 05/10/20 (from the past 24 hour(s))  Wet prep, genital     Status: Abnormal   Collection Time: 05/11/20 12:41 AM  Result Value Ref Range   Yeast Wet Prep HPF POC NONE SEEN NONE SEEN   Trich, Wet Prep NONE SEEN NONE SEEN   Clue Cells Wet Prep HPF POC NONE SEEN NONE SEEN   WBC, Wet Prep HPF  POC MANY (A) NONE SEEN   Sperm NONE SEEN   POCT fern test     Status: None   Collection Time: 05/11/20 12:57 AM  Result Value Ref Range   POCT Fern Test Negative = intact amniotic membranes     A: SIUP at [redacted]w[redacted]d  Membranes intact  1. No leakage of amniotic fluid into vagina   2. Labor, false (Braxton-Hicks), antepartum   3. [redacted] weeks gestation of pregnancy   4. NST (non-stress test) reactive     P: Discharge home Follow up as scheduled in the office for prenatal care Return to MAU as needed for reasons discussed and/or emergencies    Lajean Manes, CNM 05/11/2020 6:44 AM

## 2020-05-12 DIAGNOSIS — O24113 Pre-existing diabetes mellitus, type 2, in pregnancy, third trimester: Secondary | ICD-10-CM | POA: Diagnosis not present

## 2020-05-12 DIAGNOSIS — Z3480 Encounter for supervision of other normal pregnancy, unspecified trimester: Secondary | ICD-10-CM | POA: Diagnosis not present

## 2020-05-12 DIAGNOSIS — O09523 Supervision of elderly multigravida, third trimester: Secondary | ICD-10-CM | POA: Diagnosis not present

## 2020-05-12 DIAGNOSIS — Z3A37 37 weeks gestation of pregnancy: Secondary | ICD-10-CM | POA: Diagnosis not present

## 2020-05-13 ENCOUNTER — Other Ambulatory Visit: Payer: Self-pay | Admitting: Obstetrics and Gynecology

## 2020-05-13 ENCOUNTER — Other Ambulatory Visit (HOSPITAL_COMMUNITY)
Admission: RE | Admit: 2020-05-13 | Discharge: 2020-05-13 | Disposition: A | Discharge status: Home / Self Care | Payer: 59 | Source: Ambulatory Visit | Attending: Obstetrics and Gynecology | Admitting: Obstetrics and Gynecology

## 2020-05-13 DIAGNOSIS — Z20822 Contact with and (suspected) exposure to covid-19: Secondary | ICD-10-CM | POA: Insufficient documentation

## 2020-05-13 DIAGNOSIS — Z01812 Encounter for preprocedural laboratory examination: Secondary | ICD-10-CM | POA: Insufficient documentation

## 2020-05-13 LAB — OB RESULTS CONSOLE GBS: GBS: NEGATIVE

## 2020-05-13 LAB — SARS CORONAVIRUS 2 (TAT 6-24 HRS): SARS Coronavirus 2: NEGATIVE

## 2020-05-14 ENCOUNTER — Encounter (HOSPITAL_COMMUNITY): Payer: Self-pay | Admitting: Obstetrics and Gynecology

## 2020-05-14 ENCOUNTER — Inpatient Hospital Stay (HOSPITAL_COMMUNITY): Payer: 59 | Admitting: Anesthesiology

## 2020-05-14 ENCOUNTER — Inpatient Hospital Stay (HOSPITAL_COMMUNITY)
Admission: AD | Admit: 2020-05-14 | Discharge: 2020-05-16 | DRG: 807 | Disposition: A | Discharge status: Home / Self Care | Payer: 59 | Attending: Obstetrics and Gynecology | Admitting: Obstetrics and Gynecology

## 2020-05-14 ENCOUNTER — Inpatient Hospital Stay (HOSPITAL_COMMUNITY): Payer: 59

## 2020-05-14 ENCOUNTER — Other Ambulatory Visit: Payer: Self-pay

## 2020-05-14 DIAGNOSIS — F419 Anxiety disorder, unspecified: Secondary | ICD-10-CM | POA: Diagnosis present

## 2020-05-14 DIAGNOSIS — O24419 Gestational diabetes mellitus in pregnancy, unspecified control: Secondary | ICD-10-CM

## 2020-05-14 DIAGNOSIS — Z20822 Contact with and (suspected) exposure to covid-19: Secondary | ICD-10-CM | POA: Diagnosis present

## 2020-05-14 DIAGNOSIS — O403XX0 Polyhydramnios, third trimester, not applicable or unspecified: Secondary | ICD-10-CM | POA: Diagnosis present

## 2020-05-14 DIAGNOSIS — F32A Depression, unspecified: Secondary | ICD-10-CM | POA: Diagnosis present

## 2020-05-14 DIAGNOSIS — Z3490 Encounter for supervision of normal pregnancy, unspecified, unspecified trimester: Secondary | ICD-10-CM | POA: Diagnosis present

## 2020-05-14 DIAGNOSIS — Z3A37 37 weeks gestation of pregnancy: Secondary | ICD-10-CM | POA: Diagnosis not present

## 2020-05-14 DIAGNOSIS — O409XX0 Polyhydramnios, unspecified trimester, not applicable or unspecified: Secondary | ICD-10-CM | POA: Diagnosis present

## 2020-05-14 DIAGNOSIS — Z8632 Personal history of gestational diabetes: Secondary | ICD-10-CM

## 2020-05-14 DIAGNOSIS — O2412 Pre-existing diabetes mellitus, type 2, in childbirth: Secondary | ICD-10-CM | POA: Diagnosis not present

## 2020-05-14 DIAGNOSIS — O99344 Other mental disorders complicating childbirth: Secondary | ICD-10-CM | POA: Diagnosis present

## 2020-05-14 DIAGNOSIS — O24425 Gestational diabetes mellitus in childbirth, controlled by oral hypoglycemic drugs: Principal | ICD-10-CM | POA: Diagnosis present

## 2020-05-14 DIAGNOSIS — O9952 Diseases of the respiratory system complicating childbirth: Secondary | ICD-10-CM | POA: Diagnosis not present

## 2020-05-14 DIAGNOSIS — Z3A38 38 weeks gestation of pregnancy: Secondary | ICD-10-CM | POA: Diagnosis not present

## 2020-05-14 DIAGNOSIS — J45909 Unspecified asthma, uncomplicated: Secondary | ICD-10-CM | POA: Diagnosis not present

## 2020-05-14 LAB — CBC
HCT: 38.4 % (ref 36.0–46.0)
Hemoglobin: 13.2 g/dL (ref 12.0–15.0)
MCH: 32.1 pg (ref 26.0–34.0)
MCHC: 34.4 g/dL (ref 30.0–36.0)
MCV: 93.4 fL (ref 80.0–100.0)
Platelets: 202 10*3/uL (ref 150–400)
RBC: 4.11 MIL/uL (ref 3.87–5.11)
RDW: 15.7 % — ABNORMAL HIGH (ref 11.5–15.5)
WBC: 9.1 10*3/uL (ref 4.0–10.5)
nRBC: 0 % (ref 0.0–0.2)

## 2020-05-14 LAB — TYPE AND SCREEN
ABO/RH(D): A POS
Antibody Screen: NEGATIVE

## 2020-05-14 LAB — GLUCOSE, CAPILLARY
Glucose-Capillary: 154 mg/dL — ABNORMAL HIGH (ref 70–99)
Glucose-Capillary: 100 mg/dL — ABNORMAL HIGH (ref 70–99)
Glucose-Capillary: 103 mg/dL — ABNORMAL HIGH (ref 70–99)

## 2020-05-14 MED ORDER — PRENATAL MULTIVITAMIN CH
1.0000 | ORAL_TABLET | Freq: Every day | ORAL | Status: DC
  Administered 2020-05-15: 1 via ORAL
  Filled 2020-05-14: qty 1

## 2020-05-14 MED ORDER — METHYLERGONOVINE MALEATE 0.2 MG PO TABS: 0.2000 mg | ORAL_TABLET | ORAL | Status: DC | PRN

## 2020-05-14 MED ORDER — SIMETHICONE 80 MG PO CHEW: 80.0000 mg | CHEWABLE_TABLET | ORAL | Status: DC | PRN

## 2020-05-14 MED ORDER — FENTANYL-BUPIVACAINE-NACL 0.5-0.125-0.9 MG/250ML-% EP SOLN
EPIDURAL | Status: DC | PRN
  Administered 2020-05-14: 12 mL/h via EPIDURAL

## 2020-05-14 MED ORDER — DIBUCAINE (PERIANAL) 1 % EX OINT: 1.0000 "application " | TOPICAL_OINTMENT | CUTANEOUS | Status: DC | PRN

## 2020-05-14 MED ORDER — LACTATED RINGERS IV SOLN: 500.0000 mL | INTRAVENOUS | Status: DC | PRN

## 2020-05-14 MED ORDER — LIDOCAINE HCL (PF) 1 % IJ SOLN: 30.0000 mL | INTRAMUSCULAR | Status: DC | PRN

## 2020-05-14 MED ORDER — METHYLERGONOVINE MALEATE 0.2 MG/ML IJ SOLN: 0.2000 mg | INTRAMUSCULAR | Status: DC | PRN

## 2020-05-14 MED ORDER — BENZOCAINE-MENTHOL 20-0.5 % EX AERO
1.0000 "application " | INHALATION_SPRAY | CUTANEOUS | Status: DC | PRN
  Administered 2020-05-15: 1 via TOPICAL
  Filled 2020-05-14: qty 56

## 2020-05-14 MED ORDER — SENNOSIDES-DOCUSATE SODIUM 8.6-50 MG PO TABS
2.0000 | ORAL_TABLET | Freq: Every day | ORAL | Status: DC
  Administered 2020-05-15 – 2020-05-16 (×2): 2 via ORAL
  Filled 2020-05-14 (×2): qty 2

## 2020-05-14 MED ORDER — OXYCODONE-ACETAMINOPHEN 5-325 MG PO TABS: 2.0000 | ORAL_TABLET | ORAL | Status: DC | PRN

## 2020-05-14 MED ORDER — OXYCODONE-ACETAMINOPHEN 5-325 MG PO TABS: 1.0000 | ORAL_TABLET | ORAL | Status: DC | PRN

## 2020-05-14 MED ORDER — OXYTOCIN BOLUS FROM INFUSION
333.0000 mL | Freq: Once | INTRAVENOUS | Status: AC
  Administered 2020-05-14: 333 mL via INTRAVENOUS

## 2020-05-14 MED ORDER — LIDOCAINE HCL (PF) 1 % IJ SOLN
INTRAMUSCULAR | Status: DC | PRN
  Administered 2020-05-14 (×2): 4 mL via EPIDURAL

## 2020-05-14 MED ORDER — ACETAMINOPHEN 325 MG PO TABS
650.0000 mg | ORAL_TABLET | ORAL | Status: DC | PRN
  Administered 2020-05-14 – 2020-05-16 (×6): 650 mg via ORAL
  Filled 2020-05-14 (×6): qty 2

## 2020-05-14 MED ORDER — TERBUTALINE SULFATE 1 MG/ML IJ SOLN: 0.2500 mg | Freq: Once | INTRAMUSCULAR | Status: DC | PRN

## 2020-05-14 MED ORDER — LACTATED RINGERS IV SOLN: 500.0000 mL | Freq: Once | INTRAVENOUS | Status: DC

## 2020-05-14 MED ORDER — ESCITALOPRAM OXALATE 20 MG PO TABS
20.0000 mg | ORAL_TABLET | Freq: Every day | ORAL | Status: DC
  Administered 2020-05-14 – 2020-05-15 (×2): 20 mg via ORAL
  Filled 2020-05-14 (×2): qty 1

## 2020-05-14 MED ORDER — DIPHENHYDRAMINE HCL 25 MG PO CAPS: 25.0000 mg | ORAL_CAPSULE | Freq: Four times a day (QID) | ORAL | Status: DC | PRN

## 2020-05-14 MED ORDER — ZOLPIDEM TARTRATE 5 MG PO TABS: 5.0000 mg | ORAL_TABLET | Freq: Every evening | ORAL | Status: DC | PRN

## 2020-05-14 MED ORDER — PHENYLEPHRINE 40 MCG/ML (10ML) SYRINGE FOR IV PUSH (FOR BLOOD PRESSURE SUPPORT)
80.0000 ug | PREFILLED_SYRINGE | INTRAVENOUS | Status: DC | PRN
  Administered 2020-05-14: 80 ug via INTRAVENOUS
  Filled 2020-05-14: qty 10

## 2020-05-14 MED ORDER — FENTANYL-BUPIVACAINE-NACL 0.5-0.125-0.9 MG/250ML-% EP SOLN
12.0000 mL/h | EPIDURAL | Status: DC | PRN
  Filled 2020-05-14: qty 250

## 2020-05-14 MED ORDER — OXYTOCIN-SODIUM CHLORIDE 30-0.9 UT/500ML-% IV SOLN: 2.5000 [IU]/h | INTRAVENOUS | Status: DC

## 2020-05-14 MED ORDER — TRANEXAMIC ACID-NACL 1000-0.7 MG/100ML-% IV SOLN
INTRAVENOUS | Status: AC
  Filled 2020-05-14: qty 100

## 2020-05-14 MED ORDER — IBUPROFEN 600 MG PO TABS
600.0000 mg | ORAL_TABLET | Freq: Four times a day (QID) | ORAL | Status: DC
  Administered 2020-05-14 – 2020-05-16 (×6): 600 mg via ORAL
  Filled 2020-05-14 (×6): qty 1

## 2020-05-14 MED ORDER — ACETAMINOPHEN 325 MG PO TABS: 650.0000 mg | ORAL_TABLET | ORAL | Status: DC | PRN

## 2020-05-14 MED ORDER — DIPHENHYDRAMINE HCL 50 MG/ML IJ SOLN: 12.5000 mg | INTRAMUSCULAR | Status: DC | PRN

## 2020-05-14 MED ORDER — EPHEDRINE 5 MG/ML INJ: 10.0000 mg | INTRAVENOUS | Status: DC | PRN

## 2020-05-14 MED ORDER — ONDANSETRON HCL 4 MG/2ML IJ SOLN
4.0000 mg | Freq: Four times a day (QID) | INTRAMUSCULAR | Status: DC | PRN
  Administered 2020-05-14: 4 mg via INTRAVENOUS
  Filled 2020-05-14: qty 2

## 2020-05-14 MED ORDER — ONDANSETRON HCL 4 MG/2ML IJ SOLN: 4.0000 mg | INTRAMUSCULAR | Status: DC | PRN

## 2020-05-14 MED ORDER — LACTATED RINGERS IV SOLN: INTRAVENOUS | Status: DC

## 2020-05-14 MED ORDER — SOD CITRATE-CITRIC ACID 500-334 MG/5ML PO SOLN
30.0000 mL | ORAL | Status: DC | PRN
  Administered 2020-05-14: 30 mL via ORAL
  Filled 2020-05-14: qty 15

## 2020-05-14 MED ORDER — COCONUT OIL OIL: 1.0000 "application " | TOPICAL_OIL | Status: DC | PRN

## 2020-05-14 MED ORDER — WITCH HAZEL-GLYCERIN EX PADS: 1.0000 "application " | MEDICATED_PAD | CUTANEOUS | Status: DC | PRN

## 2020-05-14 MED ORDER — PHENYLEPHRINE 40 MCG/ML (10ML) SYRINGE FOR IV PUSH (FOR BLOOD PRESSURE SUPPORT): 80.0000 ug | PREFILLED_SYRINGE | INTRAVENOUS | Status: DC | PRN

## 2020-05-14 MED ORDER — ONDANSETRON HCL 4 MG PO TABS: 4.0000 mg | ORAL_TABLET | ORAL | Status: DC | PRN

## 2020-05-14 MED ORDER — TETANUS-DIPHTH-ACELL PERTUSSIS 5-2.5-18.5 LF-MCG/0.5 IM SUSY: 0.5000 mL | PREFILLED_SYRINGE | Freq: Once | INTRAMUSCULAR | Status: DC

## 2020-05-14 MED ORDER — OXYTOCIN-SODIUM CHLORIDE 30-0.9 UT/500ML-% IV SOLN
1.0000 m[IU]/min | INTRAVENOUS | Status: DC
  Administered 2020-05-14: 2 m[IU]/min via INTRAVENOUS
  Filled 2020-05-14: qty 500

## 2020-05-14 NOTE — Anesthesia Procedure Notes (Signed)
Epidural Patient location during procedure: OB Start time: 05/14/2020 1:24 PM End time: 05/14/2020 1:27 PM  Staffing Anesthesiologist: Brennan Bailey, MD Performed: anesthesiologist   Preanesthetic Checklist Completed: patient identified, IV checked, risks and benefits discussed, monitors and equipment checked, pre-op evaluation and timeout performed  Epidural Patient position: sitting Prep: DuraPrep and site prepped and draped Patient monitoring: continuous pulse ox, blood pressure and heart rate Approach: midline Location: L3-L4 Injection technique: LOR air  Needle:  Needle type: Tuohy  Needle gauge: 17 G Needle length: 9 cm Needle insertion depth: 6 cm Catheter type: closed end flexible Catheter size: 19 Gauge Catheter at skin depth: 11 cm Test dose: negative and Other (1% lidocaine)  Assessment Events: blood not aspirated, injection not painful, no injection resistance, no paresthesia and negative IV test  Additional Notes Patient identified. Risks, benefits, and alternatives discussed with patient including but not limited to bleeding, infection, nerve damage, paralysis, failed block, incomplete pain control, headache, blood pressure changes, nausea, vomiting, reactions to medication, itching, and postpartum back pain. Confirmed with bedside nurse the patient's most recent platelet count. Confirmed with patient that they are not currently taking any anticoagulation, have any bleeding history, or any family history of bleeding disorders. Patient expressed understanding and wished to proceed. All questions were answered. Sterile technique was used throughout the entire procedure. Please see nursing notes for vital signs.   Crisp LOR on first pass. Test dose was given through epidural catheter and negative prior to continuing to dose epidural or start infusion. Warning signs of high block given to the patient including shortness of breath, tingling/numbness in hands, complete  motor block, or any concerning symptoms with instructions to call for help. Patient was given instructions on fall risk and not to get out of bed. All questions and concerns addressed with instructions to call with any issues or inadequate analgesia.  Reason for block:procedure for pain

## 2020-05-14 NOTE — Anesthesia Preprocedure Evaluation (Signed)
Anesthesia Evaluation  Patient identified by MRN, date of birth, ID band Patient awake    Reviewed: Allergy & Precautions, Patient's Chart, lab work & pertinent test results  History of Anesthesia Complications Negative for: history of anesthetic complications  Airway Mallampati: II  TM Distance: >3 FB Neck ROM: Full    Dental no notable dental hx.    Pulmonary asthma ,    Pulmonary exam normal        Cardiovascular negative cardio ROS Normal cardiovascular exam     Neuro/Psych  Headaches, Anxiety Depression    GI/Hepatic negative GI ROS, Neg liver ROS,   Endo/Other  diabetes, Gestational, Oral Hypoglycemic Agents  Renal/GU negative Renal ROS  negative genitourinary   Musculoskeletal negative musculoskeletal ROS (+)   Abdominal   Peds  Hematology negative hematology ROS (+)   Anesthesia Other Findings Day of surgery medications reviewed with patient.  Reproductive/Obstetrics (+) Pregnancy                             Anesthesia Physical Anesthesia Plan  ASA: II  Anesthesia Plan: Epidural   Post-op Pain Management:    Induction:   PONV Risk Score and Plan: Treatment may vary due to age or medical condition  Airway Management Planned: Natural Airway  Additional Equipment:   Intra-op Plan:   Post-operative Plan:   Informed Consent: I have reviewed the patients History and Physical, chart, labs and discussed the procedure including the risks, benefits and alternatives for the proposed anesthesia with the patient or authorized representative who has indicated his/her understanding and acceptance.       Plan Discussed with:   Anesthesia Plan Comments:         Anesthesia Quick Evaluation

## 2020-05-14 NOTE — H&P (Signed)
Michele Mcbride is a 35 y.o. female presenting for Unexplained AFP elevation, NL nIPS. A2DM and severe polyhydramnios for IOL per MFM recommendation.. OB History    Gravida  3   Para  2   Term  2   Preterm      AB      Living  2     SAB      IAB      Ectopic      Multiple      Live Births  2          Past Medical History:  Diagnosis Date  . Anxiety   . Asthma   . Family history of breast cancer   . Gestational diabetes    metformin  . Migraine   . Monoallelic mutation of CHEK2 gene in female patient   . PCOS (polycystic ovarian syndrome)   . Sinus infection    Past Surgical History:  Procedure Laterality Date  . BREAST LUMPECTOMY WITH RADIOACTIVE SEED LOCALIZATION Bilateral 06/20/2019   Procedure: BILATERAL BREAST LUMPECTOMY WITH RADIOACTIVE SEED LOCALIZATION, Right x 2 and Left x1;  Surgeon: Michele Luna, MD;  Location: [facility];  Service: General;  Laterality: Bilateral;  . WISDOM TOOTH EXTRACTION     Family History: family history includes Breast cancer in her paternal aunt; Breast cancer (age of onset: 79) in her mother; Hyperlipidemia in her mother; Migraines in her mother and another family member; Neuropathy in her mother. Social History:  reports that she has never smoked. She has never used smokeless tobacco. She reports that she does not drink alcohol and does not use drugs.     Maternal Diabetes: Yes:  Diabetes Type:  Insulin/Medication controlled, Diet controlled Genetic Screening: Abnormal:  Results: Other: inc afp, declined amnio Maternal Ultrasounds/Referrals: Other: Unexplained AFP,  Fetal Ultrasounds or other Referrals:  Referred to Materal Fetal Medicine  Maternal Substance Abuse:  No Significant Maternal Medications:  Meds include: Other: metformin Significant Maternal Lab Results:  Group B Strep negative Other Comments:  None  Review of Systems  Constitutional: Negative.   All other systems reviewed and are  negative.  Maternal Medical History:  Reason for admission: Contractions.   Contractions: Onset was more than 2 days ago.   Frequency: irregular.   Perceived severity is moderate.    Fetal activity: Perceived fetal activity is normal.   Last perceived fetal movement was within the past hour.    Prenatal complications: Polyhydramnios.   Prenatal Complications - Diabetes: gestational. Diabetes is managed by oral agent (monotherapy).        Blood pressure 101/64, pulse 89, temperature 98.6 F (37 C), resp. rate 17. Maternal Exam:  Uterine Assessment: Contraction strength is moderate.  Contraction frequency is irregular.   Abdomen: Patient reports no abdominal tenderness. Fetal presentation: vertex  Introitus: Normal vulva. Normal vagina.  Ferning test: not done.  Nitrazine test: not done. Amniotic fluid character: not assessed.  Pelvis: adequate for delivery.   Cervix: Cervix evaluated by digital exam.     Physical Exam Vitals and nursing note reviewed. Exam conducted with a chaperone present.  Constitutional:      Appearance: Normal appearance. She is normal weight.  HENT:     Head: Normocephalic and atraumatic.  Cardiovascular:     Rate and Rhythm: Normal rate and regular rhythm.     Pulses: Normal pulses.     Heart sounds: Normal heart sounds.  Pulmonary:     Effort: Pulmonary effort is normal.  Breath sounds: Normal breath sounds.  Abdominal:     General: Abdomen is flat. Bowel sounds are normal.     Palpations: Abdomen is soft.  Genitourinary:    General: Normal vulva.  Musculoskeletal:     Cervical back: Normal range of motion and neck supple.  Skin:    General: Skin is warm and dry.  Neurological:     General: No focal deficit present.     Mental Status: She is alert and oriented to person, place, and time.  Psychiatric:        Behavior: Behavior normal.     Prenatal labs: ABO, Rh: --/--/A POS (09/09 2027) Antibody:  neg Rubella:  imm RPR:    neg HBsAg:   neg HIV:   neg GBS:   neg  Assessment/Plan: 37+ weeks A2DM on metformin Severe poly Uneplained AFP elevation IOL   Michele Mcbride J 05/14/2020, 11:39 AM

## 2020-05-14 NOTE — Progress Notes (Signed)
BENA KOBEL is a 35 y.o. G3P2002 at [redacted]w[redacted]d by LMP admitted for induction of labor due to Hydramnios, Diabetes and unexplained AFP elevation.  Subjective: Comfortable with epidural  Objective: BP 105/71   Pulse 87   Temp 98.4 F (36.9 C)   Resp 17  No intake/output data recorded. No intake/output data recorded.  FHT:   120s, BTBV 5-25, pos accels UC:   regular, every 2 minutes SVE:   6/90/-1/OT IUPC placed without difficulty  Labs: Lab Results  Component Value Date   WBC 9.1 05/14/2020   HGB 13.2 05/14/2020   HCT 38.4 05/14/2020   MCV 93.4 05/14/2020   PLT 202 05/14/2020    Assessment / Plan: Induction of labor due to poly, A2DM, uexplained AFP,  progressing well on pitocin  Labor: Progressing normally Preeclampsia:  no signs or symptoms of toxicity Fetal Wellbeing:  Category I Pain Control:  Epidural I/D:  n/a Anticipated MOD:  NSVD  TAAVON,RICHARD J 05/14/2020, 5:20 PM

## 2020-05-14 NOTE — Lactation Note (Signed)
This note was copied from a baby's chart. Lactation Consultation Note  Patient Name: Michele Mcbride [id number] Date: 05/14/2020 Reason for consult: L&D Initial assessment;Mother's request;Early term 37-38.6wks;Maternal endocrine disorder (Mom Radioactive seeds lumpectomy in 2021) Age: < 1hr  Infant already latched on arrival in cradle. LC reviewed with Mom feeding cues, STS and looking for signs of milk transfer. Mom to ensure lips are flanged and to tug on the chin to bring bottom lip out. Infant latched for about 20 minutes with signs of milk transfer. LC not able to score latch since occurred before arrival. LC did see signs of milk transfer and hear swallows.   More support to be provided once Mom is on the floor.   Maternal Data Has patient been taught Hand Expression?: No Does the patient have breastfeeding experience prior to this delivery?: Yes How long did the patient breastfeed?: first child lactose intolerance only able to nurse for 6 weeks, second child elevated bilirubin would not latch  Feeding Mother's Current Feeding Choice: Breast Milk  LATCH Score                    Lactation Tools Discussed/Used    Interventions Interventions: Breast feeding basics reviewed;Breast compression;Skin to skin;Support pillows;Education  Discharge Pump: Personal WIC Program: No  Consult Status Consult Status: Follow-up Date: 05/15/20 Follow-up type: In-patient    Hillary Struss  Nicholson-Springer 05/14/2020, 7:36 PM

## 2020-05-14 NOTE — Lactation Note (Signed)
This note was copied from a baby's chart. Lactation Consultation Note  Patient Name: Michele Mcbride [id number] Date: 05/14/2020 Reason for consult: Initial assessment;Mother's request;Early term 37-38.6wks;Maternal endocrine disorder Age:36 hours  On arrival, Mom stating nursing going well. Last feeding one hour ago, for about 30 minutes. LC not able to assess a latch.  Mom aware call RN or LC if she needs assistance with latching.  Infant asleep on arrival. Mom's nipples both erect with no signs of compression or trauma.  LC reviewed with Mom feeding cues, how to get a deep latch, different positions.   Plan 1. To feed based on cues 8-12x in 24 hour period no more than 4 hrs without an attempt. Mom to offer both breasts and look for signs of swallows.            2. Mom to offer EBM via spoon or finger feeding if unable to get infant to latch.            3. I and O sheet reviewed.             4. [place] brochure of inpatient and outpatient services reviewed.  All questions answered at the end of the visit.  Maternal Data Has patient been taught Hand Expression?: Yes Does the patient have breastfeeding experience prior to this delivery?: Yes How long did the patient breastfeed?: first child for 6 weeks lactose allergy, second child elevated bilrubin and would not latch  Feeding Mother's Current Feeding Choice: Breast Milk  LATCH Score                    Lactation Tools Discussed/Used    Interventions Interventions: Breast feeding basics reviewed;Skin to skin;Position options;Hand express;Expressed milk;Education  Discharge Pump: Personal [place] Program: No  Consult Status Consult Status: Follow-up Date: 05/15/20 Follow-up type: In-patient    Yanixan Mellinger  Nicholson-Springer 05/14/2020, 10:23 PM

## 2020-05-15 ENCOUNTER — Inpatient Hospital Stay (HOSPITAL_COMMUNITY): Payer: 59

## 2020-05-15 LAB — CBC
HCT: 33.1 % — ABNORMAL LOW (ref 36.0–46.0)
Hemoglobin: 11.1 g/dL — ABNORMAL LOW (ref 12.0–15.0)
MCH: 31.6 pg (ref 26.0–34.0)
MCHC: 33.5 g/dL (ref 30.0–36.0)
MCV: 94.3 fL (ref 80.0–100.0)
Platelets: 152 10*3/uL (ref 150–400)
RBC: 3.51 MIL/uL — ABNORMAL LOW (ref 3.87–5.11)
RDW: 15.2 % (ref 11.5–15.5)
WBC: 12.8 10*3/uL — ABNORMAL HIGH (ref 4.0–10.5)
nRBC: 0 % (ref 0.0–0.2)

## 2020-05-15 LAB — RPR: RPR Ser Ql: NONREACTIVE

## 2020-05-15 MED ORDER — OXYCODONE HCL 5 MG PO TABS
5.0000 mg | ORAL_TABLET | Freq: Four times a day (QID) | ORAL | Status: DC | PRN
  Administered 2020-05-15 – 2020-05-16 (×4): 5 mg via ORAL
  Filled 2020-05-15 (×4): qty 1

## 2020-05-15 MED ORDER — OXYCODONE HCL 5 MG PO TABS
5.0000 mg | ORAL_TABLET | Freq: Three times a day (TID) | ORAL | Status: DC | PRN
  Administered 2020-05-15: 5 mg via ORAL
  Filled 2020-05-15: qty 1

## 2020-05-15 NOTE — Social Work (Signed)
MOB was referred for history of depression and anxiety.   * Referral screened out by Clinical Social Worker because none of the following criteria appear to apply:  ~ History of anxiety/depression during this pregnancy, or of post-partum depression following prior delivery. ~ Diagnosis of anxiety and/or depression within last 3 years. OR * MOB's symptoms currently being treated with medication and/or therapy. MOB currently being treated with Lexapro 20mg .  MOB scored a 1 on the [place] Postpartum Depression screen.  Please contact the Clinical Social Worker if needs arise or by MOB request.   Darra Lis, [place] [employer]  [phone]

## 2020-05-15 NOTE — Progress Notes (Signed)
PPD # 1 S/P NSVD  Live born female  Birth Weight: 7 lb 10.9 oz (3485 g) APGAR: 9, 9  Newborn Delivery   Birth date/time: 05/14/2020 18:27:00 Delivery type: Vaginal, Spontaneous     Baby name: Michele Mcbride Delivering provider: Brien Few   Episiotomy:None   Lacerations:None   Feeding: breast  Pain control at delivery: Epidural   Subjective   Reports feeling well overall. Had increased pain this morning and requested stronger medication. Orders given for Oxy IR 5mg  and patient reports decreased pain. Breastfeeding going well.              Tolerating po/ No nausea or vomiting             Bleeding is moderate             Pain controlled with acetaminophen, ibuprofen (OTC) and narcotic analgesics including Oxy IR             Up ad lib / ambulatory / voiding without difficulties   Objective   A & O x 3, in no apparent distress              VS:  Vitals:   05/14/20 2134 05/15/20 0100 05/15/20 0545 05/15/20 0906  BP: 111/76 105/68 125/84 91/65  Pulse: 97 61 61 61  Resp: 18 18 18 20   Temp: 98.9 F (37.2 C) 97.9 F (36.6 C) 97.9 F (36.6 C) 98.6 F (37 C)  TempSrc: Oral Oral Oral Oral  SpO2: 100% 100% 99% 97%    LABS:  Recent Labs    05/14/20 1106 05/15/20 0644  WBC 9.1 12.8*  HGB 13.2 11.1*  HCT 38.4 33.1*  PLT 202 152    Blood type: --/--/A POS (03/10 1100)  Rubella: Nonimmune (09/01 0000)   Vaccines:   TDaP   UTD                   Flu       UTD                             COVID-19 UTD   Gen: AAO x 3, NAD  Abdomen: soft, non-tender, non-distended             Fundus: firm, non-tender, U-1  Perineum: intact  Lochia: moderate  Extremities: no edema, no calf pain or tenderness   Assessment/Plan PPD # 1 35 y.o., [id number]  Principal Problem:   Postpartum care following vaginal delivery (3/10)  Doing well - stable status  Routine post partum orders Active Problems:   Anxiety and depression  Stable on Lexapro 20mg    Encounter for induction of labor   GDM,  class A2   PP blood sugars WNL  Discontinued Metformin per Dr. Ronita Hipps  Will F/U postpartum    Polyhydramnios   SVD (spontaneous vaginal delivery) 3/10  Considering early discharge at 24 hours. Will let nursing know if discharge is desired if baby is cleared for discharge. Otherwise, anticipate discharge tomorrow.   Suzan Nailer, MSN, CNM 05/15/2020, 9:21 AM

## 2020-05-15 NOTE — Lactation Note (Signed)
This note was copied from a baby's chart. Lactation Consultation Note  Patient Name: Michele Mcbride FWYOV'[id number] Date: 05/15/2020 Reason for consult: Follow-up assessment Age:36 hours P3, ETI female infant -1% weight loss. Infant had 2 voids and 2 stools since birth. Per mom, infant is breastfeeding well, she has recently started to cluster feed, most feedings are 15 to 30 minutes in length. LC did not observe latch, infant had finished breastfeeding prior to [facility] entering the room. Mom will continue to breastfeed infant by cues, 8 to 12+ or more times within 24 hours, STS. Mom does not have any questions or concerns for LC at this time.   Maternal Data    Feeding Mother's Current Feeding Choice: Breast Milk  LATCH Score                    Lactation Tools Discussed/Used    Interventions Interventions: Skin to skin  Discharge    Consult Status Consult Status: Follow-up Date: 05/16/20 Follow-up type: In-patient    Vicente Serene 05/15/2020, 6:15 PM

## 2020-05-15 NOTE — Anesthesia Postprocedure Evaluation (Signed)
Anesthesia Post Note  Patient: Michele Mcbride  Procedure(s) Performed: AN AD HOC LABOR EPIDURAL     Patient location during evaluation: Mother Baby Anesthesia Type: Epidural Level of consciousness: awake and alert Pain management: pain level controlled Vital Signs Assessment: post-procedure vital signs reviewed and stable Respiratory status: spontaneous breathing, nonlabored ventilation and respiratory function stable Cardiovascular status: stable Postop Assessment: no headache, no backache and epidural receding Anesthetic complications: no   No complications documented.  Last Vitals:  Vitals:   05/15/20 0100 05/15/20 0545  BP: 105/68 125/84  Pulse: 61 61  Resp: 18 18  Temp: 36.6 C 36.6 C  SpO2: 100% 99%    Last Pain:  Vitals:   05/15/20 0751  TempSrc:   PainSc: 8    Pain Goal: Patients Stated Pain Goal: 1 (05/14/20 1112)                 [name]

## 2020-05-16 ENCOUNTER — Other Ambulatory Visit (HOSPITAL_COMMUNITY): Payer: Self-pay

## 2020-05-16 MED ORDER — LIDOCAINE 5 % EX PTCH: 1.0000 | MEDICATED_PATCH | CUTANEOUS | 0 refills | Status: DC

## 2020-05-16 MED ORDER — ACETAMINOPHEN 500 MG PO TABS: 1000.0000 mg | ORAL_TABLET | Freq: Four times a day (QID) | ORAL | 2 refills | Status: DC | PRN

## 2020-05-16 MED ORDER — LIDOCAINE 5 % EX PTCH
1.0000 | MEDICATED_PATCH | CUTANEOUS | Status: DC
  Administered 2020-05-16: 1 via TRANSDERMAL
  Filled 2020-05-16: qty 1

## 2020-05-16 MED ORDER — BENZOCAINE-MENTHOL 20-0.5 % EX AERO: 1.0000 "application " | INHALATION_SPRAY | CUTANEOUS | Status: DC | PRN

## 2020-05-16 MED ORDER — IBUPROFEN 600 MG PO TABS: 600.0000 mg | ORAL_TABLET | Freq: Four times a day (QID) | ORAL | 0 refills | Status: DC

## 2020-05-16 MED ORDER — COCONUT OIL OIL: 1.0000 "application " | TOPICAL_OIL | 0 refills | Status: DC | PRN

## 2020-05-16 MED FILL — LIDOCAINE 5 % PTCH: 5 | 30 days supply | Qty: 30 | Fill #0

## 2020-05-16 MED FILL — IBUPROFEN 600 MG TABLET: 600 | 7 days supply | Qty: 30 | Fill #0

## 2020-05-16 NOTE — Lactation Note (Signed)
This note was copied from a baby's chart. Lactation Consultation Note  Patient Name: Girl Lenisha Lacap OILNZ'V Date: 05/16/2020   Age:37 hours  Mom reports bf going well.  Infant with adequate voids and stools.  Mom and baby snuggling on arrival. Praised bf.  Urged to call lactation as needed.  Mom has [facility] breastfeeding Consultation services handout.  Maternal Data    Feeding    LATCH Score                    Lactation Tools Discussed/Used    Interventions    Discharge    Consult Status      Lynsee Wands Thompson Caul 05/16/2020, 9:22 AM

## 2020-05-16 NOTE — Discharge Summary (Signed)
OB Discharge Summary  Patient Name: Michele Mcbride DOB: [birth year] MRN: [id number]  Date of admission: 05/14/2020 Delivering provider: Brien Few   Admitting diagnosis: Encounter for induction of labor [Z34.90] Intrauterine pregnancy: [redacted]w[redacted]d     Secondary diagnosis: Patient Active Problem List   Diagnosis Date Noted  . Encounter for induction of labor 05/14/2020  . GDM, class A2 05/14/2020  . Polyhydramnios 05/14/2020  . SVD (spontaneous vaginal delivery) 05/14/2020  . Postpartum care following vaginal delivery (3/10) 05/14/2020  . Anxiety and depression 09/19/2012   Additional problems:concern for narcotic seeking behavior. Patient reports pain can only be controlled with oxycodone. Pain is abdominal cramping that refers to the back and back pain at epidural site.  Has been taking ibuprofen and Tylenol along with oxycodone for past 2 days but states her cramping and back pain not improved.  RN reports patient is rating her pain 7-8 / 10, observes patient comfortably resting or chatting on the phone throughout her stay.  When advising patient she needs to try other pain control options like heating pads or lidocaine patch for her back pain, patient refused to consider, states lidocaine patches are too expensive, and she tried "everything" and does not work. Patient teary and upset immediately when provider declined to prescribe narcotic for discharge until other pain control methods are exhausted and demands to speak w/ Dr. Ronita Hipps.  OB on call advised of patient status, concurs with plan of care, patient to follow up in office on Monday if pain unrelieved by medications or call answering service over the weekend.  Administrative Coordinator consulted and aware of situation.   Date of discharge: 05/16/2020   Discharge diagnosis: Principal Problem:   Postpartum care following vaginal delivery (3/10) Active Problems:   Anxiety and depression   Encounter for induction of labor   GDM,  class A2   Polyhydramnios   SVD (spontaneous vaginal delivery)                                                              Post partum procedures:none  Augmentation: AROM and Pitocin Pain control: Epidural  Laceration:None  Episiotomy:None  Complications: None  Hospital course:  Induction of Labor With Vaginal Delivery   35 y.o. yo G3P3003 at [redacted]w[redacted]d was admitted to the hospital 05/14/2020 for induction of labor.  Indication for induction: A2 DM.  Patient had an uncomplicated labor course as follows: Membrane Rupture Time/Date: 12:10 PM ,05/14/2020   Delivery Method:Vaginal, Spontaneous  Episiotomy: None  Lacerations:  None  Details of delivery can be found in separate delivery note.  Patient had a routine postpartum course. Patient is discharged home 05/16/20.  Newborn Data: Birth date:05/14/2020  Birth time:6:27 PM  Gender:Female  Living status:Living  Apgars:9 ,9  Weight:3485 g   Physical exam  Vitals:   05/15/20 0906 05/15/20 1452 05/15/20 2220 05/16/20 0615  BP: 91/65 109/78 107/75 109/73  Pulse: 61 65 (!) 54 (!) 51  Resp: 20 17 18 18   Temp: 98.6 F (37 C) 98.5 F (36.9 C) 98 F (36.7 C) 98 F (36.7 C)  TempSrc: Oral Oral Oral Oral  SpO2: 97% 99%  99%   General: AAO x3 Lochia: appropriate Uterine Fundus: firm U-1 Perineum: no edema, small lochia DVT Evaluation: No cords or calf tenderness. No significant calf/ankle  edema. Labs: Lab Results  Component Value Date   WBC 12.8 (H) 05/15/2020   HGB 11.1 (L) 05/15/2020   HCT 33.1 (L) 05/15/2020   MCV 94.3 05/15/2020   PLT 152 05/15/2020   CMP Latest Ref Rng & Units 11/14/2019  Glucose 70 - 99 mg/dL 122(H)  BUN 6 - 20 mg/dL <5(L)  Creatinine 0.44 - 1.00 mg/dL 0.55  Sodium 135 - 145 mmol/L 138  Potassium 3.5 - 5.1 mmol/L 3.3(L)  Chloride 98 - 111 mmol/L 105  CO2 22 - 32 mmol/L 24  Calcium 8.9 - 10.3 mg/dL 9.3  Total Protein 6.5 - 8.1 g/dL 6.6  Total Bilirubin 0.3 - 1.2 mg/dL 0.2(L)  Alkaline Phos 38 - 126  U/L 68  AST 15 - 41 U/L 17  ALT 0 - 44 U/L 14   Edinburgh Postnatal Depression Scale Screening Tool 05/15/2020  I have been able to laugh and see the funny side of things. 0  I have looked forward with enjoyment to things. 0  I have blamed myself unnecessarily when things went wrong. 1  I have been anxious or worried for no good reason. 0  I have felt scared or panicky for no good reason. 0  Things have been getting on top of me. 0  I have been so unhappy that I have had difficulty sleeping. 0  I have felt sad or miserable. 0  I have been so unhappy that I have been crying. 0  The thought of harming myself has occurred to me. 0  Edinburgh Postnatal Depression Scale Total 1   Vaccines: TDaP          UTD         Flu             UTD                    COVID-19 UTD  Discharge instruction:  per After Visit Summary,  [facility] OB booklet and  "Understanding Mother & Baby Care" hospital booklet  After Visit Meds:  Allergies as of 05/16/2020      Reactions   Strawberry Extract Anaphylaxis   Latex Rash   Rash and sores      Medication List    STOP taking these medications   metFORMIN 500 MG tablet Commonly known as: GLUCOPHAGE   metoCLOPramide 10 MG tablet Commonly known as: Reglan     TAKE these medications   acetaminophen 500 MG tablet Commonly known as: TYLENOL Take 2 tablets (1,000 mg total) by mouth every 6 (six) hours as needed. What changed: how much to take   benzocaine-Menthol 20-0.5 % Aero Commonly known as: DERMOPLAST Apply 1 application topically as needed for irritation (perineal discomfort).   coconut oil Oil Apply 1 application topically as needed.   escitalopram 20 MG tablet Commonly known as: LEXAPRO Take 20 mg by mouth daily.   ibuprofen 600 MG tablet Commonly known as: ADVIL Take 1 tablet (600 mg total) by mouth every 6 (six) hours.   lidocaine 5 % Commonly known as: LIDODERM Place 1 patch onto the skin daily. Remove & Discard patch within 12  hours or as directed by MD   loratadine 10 MG tablet Commonly known as: CLARITIN Take 10 mg by mouth daily.   PRENATAL PO Take by mouth.            Discharge Care Instructions  (From admission, onward)         Start  Ordered   05/16/20 0000  Discharge wound care:       Comments: Sitz baths 2 times /day with warm water x 1 week. May add herbals: 1 ounce dried comfrey leaf* 1 ounce calendula flowers 1 ounce lavender flowers  Supplies can be found online at [employer] sources at [employer], [employer]  1/2 ounce dried uva ursi leaves 1/2 ounce witch hazel blossoms (if you can find them) 1/2 ounce dried sage leaf 1/2 cup sea salt Directions: Bring 2 quarts of water to a boil. Turn off heat, and place 1 ounce (approximately 1 large handful) of the above mixed herbs (not the salt) into the pot. Steep, covered, for 30 minutes.  Strain the liquid well with a fine mesh strainer, and discard the herb material. Add 2 quarts of liquid to the tub, along with the 1/2 cup of salt. This medicinal liquid can also be made into compresses and peri-rinses.   05/16/20 0913          Diet: routine diet  Activity: Advance as tolerated. Pelvic rest for 6 weeks.   Postpartum contraception: Not Discussed  Newborn Data: Live born female  Birth Weight: 7 lb 10.9 oz (3485 g) APGAR: 109, 9  Newborn Delivery   Birth date/time: 05/14/2020 18:27:00 Delivery type: Vaginal, Spontaneous      named [name] Baby Feeding: Breast Disposition:home with mother   Delivery Report:   Review the Delivery Report for details.    Follow up:  Follow-up Information    Brien Few, MD. Schedule an appointment as soon as possible for a visit in 2 week(s).   Specialty: Obstetrics and Gynecology Contact information: [address] [place] [address] [phone]                 Signed: Otilio Carpen, MSN 05/16/2020, 9:57 AM

## 2020-05-16 NOTE — Discharge Instructions (Signed)
Lactation outpatient support - home visit  Scherry Ran RN, MHA, IBCLC at [employer]: Lactation Consultant  [email or web address] Mail: [email or web address] Tel: [phone]    Additional resources:  [facility] [email or web address]   Chiropractic specialist   Dr. Marja Kays [email or web address]  Craniosacral therapy for baby  Roylene Reason  [email or web address]

## 2020-06-25 ENCOUNTER — Other Ambulatory Visit (HOSPITAL_COMMUNITY): Payer: Self-pay

## 2020-06-25 DIAGNOSIS — R87810 Cervical high risk human papillomavirus (HPV) DNA test positive: Secondary | ICD-10-CM | POA: Diagnosis not present

## 2020-06-25 DIAGNOSIS — Z124 Encounter for screening for malignant neoplasm of cervix: Secondary | ICD-10-CM | POA: Diagnosis not present

## 2020-06-25 MED ORDER — NORETHINDRONE 0.35 MG PO TABS
ORAL_TABLET | ORAL | 3 refills | Status: DC
  Filled 2020-06-25 – 2020-07-04 (×2): qty 84, 84d supply, fill #0

## 2020-07-03 ENCOUNTER — Other Ambulatory Visit (HOSPITAL_COMMUNITY): Payer: Self-pay

## 2020-07-04 ENCOUNTER — Other Ambulatory Visit (HOSPITAL_COMMUNITY): Payer: Self-pay

## 2020-07-15 ENCOUNTER — Other Ambulatory Visit (HOSPITAL_COMMUNITY): Payer: Self-pay

## 2020-07-15 MED ORDER — METRONIDAZOLE 0.75 % VA GEL
VAGINAL | 0 refills | Status: DC
  Filled 2020-07-15: qty 70, 5d supply, fill #0

## 2020-07-23 ENCOUNTER — Other Ambulatory Visit (HOSPITAL_COMMUNITY): Payer: Self-pay

## 2020-08-05 ENCOUNTER — Encounter: Payer: Self-pay | Admitting: Oncology

## 2020-08-05 ENCOUNTER — Inpatient Hospital Stay: Payer: 59 | Admitting: Oncology

## 2020-08-06 ENCOUNTER — Telehealth: Payer: Self-pay | Admitting: Oncology

## 2020-08-06 NOTE — Telephone Encounter (Signed)
Scheduled appointment per 06/01 sch msg. Patient is aware.

## 2020-08-22 ENCOUNTER — Telehealth: Payer: Medicaid Other | Admitting: Nurse Practitioner

## 2020-08-22 DIAGNOSIS — J0100 Acute maxillary sinusitis, unspecified: Secondary | ICD-10-CM | POA: Diagnosis not present

## 2020-08-22 MED ORDER — AMOXICILLIN-POT CLAVULANATE 875-125 MG PO TABS: 1.0000 | ORAL_TABLET | Freq: Two times a day (BID) | ORAL | 0 refills | Status: DC

## 2020-08-22 MED ORDER — FLUTICASONE PROPIONATE 50 MCG/ACT NA SUSP: 2.0000 | Freq: Every day | NASAL | 6 refills | Status: DC

## 2020-08-22 NOTE — Progress Notes (Signed)

## 2020-09-02 ENCOUNTER — Encounter: Payer: Self-pay | Admitting: *Deleted

## 2020-10-01 ENCOUNTER — Telehealth: Payer: Medicaid Other | Admitting: Physician Assistant

## 2020-10-01 DIAGNOSIS — J4541 Moderate persistent asthma with (acute) exacerbation: Secondary | ICD-10-CM

## 2020-10-01 MED ORDER — PREDNISONE 20 MG PO TABS: 40.0000 mg | ORAL_TABLET | Freq: Every day | ORAL | 0 refills | Status: DC

## 2020-10-01 MED ORDER — ALBUTEROL SULFATE HFA 108 (90 BASE) MCG/ACT IN AERS: 2.0000 | INHALATION_SPRAY | Freq: Four times a day (QID) | RESPIRATORY_TRACT | 0 refills | Status: DC | PRN

## 2020-10-01 NOTE — Progress Notes (Signed)
I have spent 5 minutes in review of e-visit questionnaire, review and updating patient chart, medical decision making and response to patient.   Corinthian Mizrahi Cody Draper Gallon, PA-C    

## 2020-10-01 NOTE — Progress Notes (Signed)
Visit for Asthma  Based on what you have shared with me, it looks like you may have a flare up of your asthma.  Asthma is a chronic (ongoing) lung disease which results in airway obstruction, inflammation and hyper-responsiveness.   Asthma symptoms vary from person to person, with common symptoms including nighttime awakening and decreased ability to participate in normal activities as a result of shortness of breath. It is often triggered by changes in weather, changes in the season, changes in air temperature, or inside (home, school, daycare or work) allergens such as animal dander, mold, mildew, woodstoves or cockroaches.   It can also be triggered by hormonal changes, extreme emotion, physical exertion or an upper respiratory tract illness.     It is important to identify the trigger, and then eliminate or avoid the trigger if possible.   If you have been prescribed medications to be taken on a regular basis, it is important to follow the asthma action plan and to follow guidelines to adjust medication in response to increasing symptoms of decreased peak expiratory flow rate  Treatment: I have prescribed: Albuterol (Proventil HFA; Ventolin HFA) 108 (90 Base) MCG/ACT Inhaler 2 puffs into the lungs every six hours as needed for wheezing or shortness of breath and Prednisone '40mg'$  by mouth per day for 5 - 7 days. If you are able to, we recommend that you use any stored breast milk as much as possible while taking the steroids, and using the pump and dump method as well. While lower doses and short courses of steroids are less likely to have any effects in your little one, it is always better to err on the side of safety.   HOME CARE Only take medications as instructed by your medical team. Consider wearing a mask or scarf to improve breathing air temperature have been shown to decrease  irritation and decrease exacerbations Get rest. Taking a steamy shower or using a humidifier may help nasal congestion sand ease sore throat pain. You can place a towel over your head and breathe in the steam from hot water coming from a faucet. Using a saline nasal spray works much the same way.  Cough drops, hare candies and sore throat lozenges may ease your cough.  Avoid close contacts especially the very you and the elderly Cover your mouth if you cough or sneeze Always remember to wash your hands.    GET HELP RIGHT AWAY IF: You develop worsening symptoms; breathlessness at rest, drowsy, confused or agitated, unable to speak in full sentences You have coughing fits You develop a severe headache or visual changes You develop shortness of breath, difficulty breathing or start having chest pain Your symptoms persist after you have completed your treatment plan If your symptoms do not improve within 10 days  MAKE SURE YOU Understand these instructions. Will watch your condition. Will get help right away if you are not doing well or get worse.   Your e-visit answers were reviewed by a board certified advanced clinical practitioner to complete your personal care plan, Depending upon the condition, your plan could have included both over the counter or prescription medications.   Please review your pharmacy choice. Your safety is important to [place]. If you have drug allergies check your prescription carefully.  You can use MyChart to ask questions about today's visit, request a non-urgent  call back, or ask for a work or school excuse for 24 hours related to this e-Visit. If it has been greater than 24  hours you will need to follow up with your provider, or enter a new e-Visit to address those concerns.   You will get an e-mail in the next two days asking about your experience. I hope that your e-visit has been valuable and will speed your recovery. Thank you for using e-visits.

## 2020-10-11 NOTE — Progress Notes (Signed)
[facility]  Telephone:(336) [phone] Fax:(336) [phone]     ID: Michele Mcbride DOB: [birth year]  MR#: [id number]  JSH#:[id number]  Patient Care Team: Midge Minium, MD as PCP - General (Family Medicine) Lahoma Crocker, MD as Consulting Physician (Obstetrics and Gynecology) Tyrick Dunagan, Virgie Dad, MD as Consulting Physician (Oncology) Harold Hedge, Darrick Grinder, MD as Consulting Physician (Allergy and Immunology) Melvenia Beam, MD as Consulting Physician (Neurology) Lahoma Crocker, MD as Consulting Physician (Obstetrics and Gynecology) Eyvonne Mechanic as Counselor (Licensed Clinical Social Worker) Chauncey Cruel, MD OTHER MD:  CHIEF COMPLAINT: high risk for breast cancer  CURRENT TREATMENT: Intensified screening; tamoxifen   INTERVAL HISTORY: [name] returns today for follow up of her high risk for breast cancer. She was last seen here on 01/14/2019 in consultation.  At that time she was set up for intensified screening and started tamoxifen, which she tolerated well..  She then became unexpectedly pregnant and the tamoxifen and mammography screening studies were stopped.    On 05/14/2020 she delivered a 3485 g baby girl with an Apgar of 9, named Michele Mcbride.  Her last mammogram was January 2021 and of course was followed by her surgery April 2021.   REVIEW OF SYSTEMS: [name] did well with her pregnancy.  The girl is now 42 months old and she sleeps through most of the night,, getting up around 5 AM.  She "weaned herself from nursing".  [name]'s other 2 children, aged 50 and 6, tell her that they want another baby.  [name] herself is currently walking between 8 and 10,000 steps most days mostly at work.  She does not have the time to exercise outside of work and home duties.  A detailed review of systems today was otherwise stable   COVID 19 VACCINATION STATUS: Had [place] in 2020 and then had [place] x2, no booster as of August 2022.   HISTORY OF CURRENT  ILLNESS: From the original intake note:  Carole Binning tells me her mother was diagnosed with breast cancer at age 11. She learned about the genetics counseling option here and met with our counselor.  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   Her subsequent history is detailed below   PAST MEDICAL HISTORY: Past Medical History:  Diagnosis Date   Anxiety    Asthma    Family history of breast cancer    Gestational diabetes    metformin   Migraine    Monoallelic mutation of CHEK2 gene in female patient    PCOS (polycystic ovarian syndrome)    Sinus infection     PAST SURGICAL HISTORY: Past Surgical History:  Procedure Laterality Date   BREAST LUMPECTOMY WITH RADIOACTIVE SEED LOCALIZATION Bilateral 06/20/2019   Procedure: BILATERAL BREAST LUMPECTOMY WITH RADIOACTIVE SEED LOCALIZATION, Right x 2 and Left x1;  Surgeon: Erroll Luna, MD;  Location: [facility];  Service: General;  Laterality: Bilateral;   WISDOM TOOTH EXTRACTION      FAMILY HISTORY: Family History  Problem Relation Age of Onset   Hyperlipidemia Mother    Neuropathy Mother    Migraines Mother    Breast cancer Mother 7   Migraines Other        sisters, aunts, and grandmother had migraines on mother's side   Breast cancer Paternal Aunt   Patient has limited info about her father and that side of the family.  Her father is in his late 54s as of November 2020.  Patient's mother  is 35 years old as of 11/2018. She underwent a breast lumpectomy at age 88.  She tells me she had no radiation, no antiestrogens, and no chemotherapy.  3 maternal aunts and 3 maternal uncles have no history of cancer to the patient's knowledge.  She does not have complete information on them however.  Maternal grandmother died at 40 and grandfather at 62, neither from cancer causes.  The patient denies a family hx of ovarian cancer. She has 2 half-siblings. Brother passed away in  2018/08/24.   GYNECOLOGIC HISTORY:  No LMP recorded. Menarche: 35 years old Age at first live birth: 35 years old [place] P 3 Contraceptive: HRT n/a  Hysterectomy? no BSO? 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 Michele Mcbride graduated from [employer] 's nursing program. She is divorced. She lives at home with daughter Michele Mcbride, 5 years old, and son Michele Mcbride 25 years old in addition to 82-monthold Michele Mcbride.  the patient's (ex)mother in law helps to care for them while the patient works.  The patient is not a church attender    ADVANCED DIRECTIVES: Not in place.  At the 01/14/2019 visit the patient was given the appropriate documents to complete a notarize at her discretion.   HEALTH MAINTENANCE: Social History   Tobacco Use   Smoking status: Never   Smokeless tobacco: Never  Vaping Use   Vaping Use: Never used  Substance Use Topics   Alcohol use: No   Drug use: No     Colonoscopy: To start at age 35 PAP: 10/2018,   Bone density: n/a   Allergies  Allergen Reactions   Strawberry Extract Anaphylaxis   Latex Rash    Rash and sores    Current Outpatient Medications  Medication Sig Dispense Refill   tamoxifen (NOLVADEX) 20 MG tablet Take 1 tablet (20 mg total) by mouth daily. 90 tablet 4   albuterol (VENTOLIN HFA) 108 (90 Base) MCG/ACT inhaler Inhale 2 puffs into the lungs every 6 (six) hours as needed for wheezing or shortness of breath. 8 g 0   fluticasone (FLONASE) 50 MCG/ACT nasal spray Place 2 sprays into both nostrils daily. 16 g 6   norethindrone (MICRONOR) 0.35 MG tablet Take 1 tablet by mouth daily 84 tablet 3   Prenatal Vit-Fe Fumarate-FA (PRENATAL PO) Take by mouth.     Current Facility-Administered Medications  Medication Dose Route Frequency Provider Last Rate Last Admin   terbinafine (LAMISIL) 1 % cream   Topical BID Hedges, JDellis Filbert PA-C        OBJECTIVE: Young white woman who appears stated age  Vitals:    10/12/20 1501  BP: 102/68  Pulse: 95  Resp: 18  Temp: 97.8 F (36.6 C)  SpO2: 98%     Body mass index is 33.71 kg/m.   Wt Readings from Last 3 Encounters:  10/12/20 184 lb 4.8 oz (83.6 kg)  05/11/20 182 lb 3 oz (82.6 kg)  05/01/20 182 lb 11.2 oz (82.9 kg)   Sclerae unicteric, EOMs intact Wearing a mask No cervical or supraclavicular adenopathy Lungs no rales or rhonchi Heart regular rate and rhythm Abd soft, nontender, positive bowel sounds MSK no focal spinal tenderness, no upper extremity lymphedema Neuro: nonfocal, well oriented, appropriate affect Breasts: No palpable masses, no skin or nipple changes of concern, both axillae are benign  LAB RESULTS:  CMP     Component Value Date/Time   NA 138 11/14/2019 2027  K 3.3 (L) 11/14/2019 2027   CL 105 11/14/2019 2027   CO2 24 11/14/2019 2027   GLUCOSE 122 (H) 11/14/2019 2027   BUN <5 (L) 11/14/2019 2027   CREATININE 0.55 11/14/2019 2027   CREATININE 1.05 04/29/2016 1547   CALCIUM 9.3 11/14/2019 2027   PROT 6.6 11/14/2019 2027   ALBUMIN 3.4 (L) 11/14/2019 2027   AST 17 11/14/2019 2027   ALT 14 11/14/2019 2027   ALKPHOS 68 11/14/2019 2027   BILITOT 0.2 (L) 11/14/2019 2027   GFRNONAA >60 11/14/2019 2027   GFRAA >60 11/14/2019 2027    No results found for: TOTALPROTELP, ALBUMINELP, A1GS, A2GS, BETS, BETA2SER, GAMS, MSPIKE, SPEI  No results found for: KPAFRELGTCHN, LAMBDASER, KAPLAMBRATIO  Lab Results  Component Value Date   WBC 12.8 (H) 05/15/2020   NEUTROABS 6.6 06/12/2019   HGB 11.1 (L) 05/15/2020   HCT 33.1 (L) 05/15/2020   MCV 94.3 05/15/2020   PLT 152 05/15/2020   No results found for: LABCA2  No components found for: GXQJJH417  No results for input(s): INR in the last 168 hours.  No results found for: LABCA2  No results found for: EYC144  No results found for: YJE563  No results found for: JSH702  No results found for: CA2729  No components found for: HGQUANT  No results found for: CEA1 /  No results found for: CEA1   No results found for: AFPTUMOR  No results found for: CHROMOGRNA  No results found for: HGBA, HGBA2QUANT, HGBFQUANT, HGBSQUAN (Hemoglobinopathy evaluation)   Lab Results  Component Value Date   LDH 127 05/10/2010    No results found for: IRON, TIBC, IRONPCTSAT (Iron and TIBC)  No results found for: FERRITIN  Urinalysis    Component Value Date/Time   COLORURINE AMBER (A) 09/09/2012 0815   APPEARANCEUR CLOUDY (A) 09/09/2012 0815   LABSPEC 1.026 09/09/2012 0815   PHURINE 5.5 09/09/2012 0815   GLUCOSEU NEGATIVE 09/09/2012 0815   HGBUR LARGE (A) 09/09/2012 0815   HGBUR negative 05/04/2007 1526   BILIRUBINUR negative 12/07/2017 1216   KETONESUR 15 (A) 09/09/2012 0815   PROTEINUR Positive (A) 12/07/2017 1216   PROTEINUR 100 (A) 09/09/2012 0815   UROBILINOGEN 1.0 12/07/2017 1216   UROBILINOGEN 1.0 09/09/2012 0815   NITRITE negative 12/07/2017 1216   NITRITE NEGATIVE 09/09/2012 0815   LEUKOCYTESUR Negative 12/07/2017 1216    STUDIES: No results found.   ELIGIBLE FOR AVAILABLE RESEARCH PROTOCOL: no  ASSESSMENT: 35 y.o. [place], [place] woman with a deleterious CHEK2 mutation  (1) genetics testing 12/10/2018 showed a likely pathogenic variant in the CHEK2 gene, called c.1427C>T  (a) tests through the CancerNext+RNAinsight gene panel offered by [employer] found no additional deleterious mutations in APC*, ATM*, AXIN2, BARD1, BMPR1A, BRCA1*, BRCA2*, BRIP1*, CDH1*, CDK4, CDKN2A, CHEK2*, DICER1, MLH1*, MSH2*, MSH3, MSH6*, MUTYH*, NBN, NF1*, NTHL1, PALB2*, PMS2*, PTEN*, RAD51C*, RAD51D*, RECQL, SMAD4, SMARCA4, STK11 and TP53* (sequencing and deletion/duplication); HOXB13, POLD1 and POLE (sequencing only); EPCAM and GREM1 (deletion/duplication only). DNA and RNA analyses performed for * genes.  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  She is aware of the possible toxicities side effects and complications of tamoxifen, which she did tolerate well previously and I have placed that prescription in for her.  She is overdue for intensified screening.  She will have a breast MRI in the next 2 weeks or so and then she will have mammography 6 months later.  She will let me know if she has any side effects from the tamoxifen or any other complications.  Otherwise she will return for her next visit in March 2023  Total encounter time 35 minutes.Chauncey Cruel, MD   10/12/2020 6:22 PM Medical Oncology and Hematology [facility] [place], [place] [address] Tel. [phone]    Fax. [phone]   This document serves as a record of services personally performed by Lurline Del, MD. It was created on his behalf by Wilburn Mylar, a trained medical scribe. The creation of this record is based on the scribe's personal observations and the provider's statements to them.   I, Lurline Del MD, have reviewed the above documentation for accuracy and completeness, and I agree with the above.   *Total Encounter Time as defined by the Centers for Medicare and Medicaid Services includes, in addition to the face-to-face time of a patient visit (documented in the note above) non-face-to-face time: obtaining and reviewing outside history, ordering and reviewing medications, tests or procedures, care coordination (communications with other health  care professionals or caregivers) and documentation in the medical record.

## 2020-10-12 ENCOUNTER — Other Ambulatory Visit: Payer: Self-pay

## 2020-10-12 ENCOUNTER — Inpatient Hospital Stay: Payer: Medicaid Other | Attending: Oncology | Admitting: Oncology

## 2020-10-12 VITALS — BP 102/68 | HR 95 | Temp 97.8°F | Resp 18 | Ht 62.0 in | Wt 184.3 lb

## 2020-10-12 DIAGNOSIS — Z8616 Personal history of COVID-19: Secondary | ICD-10-CM | POA: Insufficient documentation

## 2020-10-12 DIAGNOSIS — Z803 Family history of malignant neoplasm of breast: Secondary | ICD-10-CM | POA: Diagnosis not present

## 2020-10-12 DIAGNOSIS — Z79810 Long term (current) use of selective estrogen receptor modulators (SERMs): Secondary | ICD-10-CM | POA: Diagnosis not present

## 2020-10-12 DIAGNOSIS — Z9189 Other specified personal risk factors, not elsewhere classified: Secondary | ICD-10-CM

## 2020-10-12 DIAGNOSIS — Z1239 Encounter for other screening for malignant neoplasm of breast: Secondary | ICD-10-CM

## 2020-10-12 DIAGNOSIS — Z1501 Genetic susceptibility to malignant neoplasm of breast: Secondary | ICD-10-CM | POA: Insufficient documentation

## 2020-10-12 MED ORDER — TAMOXIFEN CITRATE 20 MG PO TABS: 20.0000 mg | ORAL_TABLET | Freq: Every day | ORAL | 4 refills | Status: AC

## 2020-10-12 NOTE — Progress Notes (Signed)
mm

## 2020-10-13 ENCOUNTER — Telehealth: Payer: Self-pay | Admitting: Oncology

## 2020-10-13 NOTE — Telephone Encounter (Signed)
Scheduled appts per 8/8 sch msg. Pt aware.  

## 2020-11-02 ENCOUNTER — Other Ambulatory Visit: Payer: Self-pay

## 2020-11-02 ENCOUNTER — Ambulatory Visit
Admission: RE | Admit: 2020-11-02 | Discharge: 2020-11-02 | Disposition: A | Discharge status: Home / Self Care | Payer: 59 | Source: Ambulatory Visit | Attending: Oncology | Admitting: Oncology

## 2020-11-02 DIAGNOSIS — Z1239 Encounter for other screening for malignant neoplasm of breast: Secondary | ICD-10-CM

## 2020-11-02 DIAGNOSIS — Z9189 Other specified personal risk factors, not elsewhere classified: Secondary | ICD-10-CM

## 2020-11-02 MED ORDER — GADOBUTROL 1 MMOL/ML IV SOLN
8.0000 mL | Freq: Once | INTRAVENOUS | Status: AC | PRN
  Administered 2020-11-02: 8 mL via INTRAVENOUS

## 2020-12-04 ENCOUNTER — Ambulatory Visit: Payer: 59 | Admitting: Family Medicine

## 2020-12-08 ENCOUNTER — Other Ambulatory Visit (HOSPITAL_COMMUNITY): Payer: Self-pay

## 2020-12-18 ENCOUNTER — Encounter: Payer: Self-pay | Admitting: Family Medicine

## 2020-12-18 ENCOUNTER — Other Ambulatory Visit: Payer: Self-pay

## 2020-12-18 ENCOUNTER — Ambulatory Visit (INDEPENDENT_AMBULATORY_CARE_PROVIDER_SITE_OTHER): Payer: Medicaid Other | Admitting: Family Medicine

## 2020-12-18 VITALS — BP 110/80 | HR 103 | Temp 98.8°F | Resp 16 | Wt 188.2 lb

## 2020-12-18 DIAGNOSIS — E669 Obesity, unspecified: Secondary | ICD-10-CM

## 2020-12-18 DIAGNOSIS — E8881 Metabolic syndrome: Secondary | ICD-10-CM

## 2020-12-18 DIAGNOSIS — Z8632 Personal history of gestational diabetes: Secondary | ICD-10-CM

## 2020-12-18 MED ORDER — OZEMPIC (0.25 OR 0.5 MG/DOSE) 2 MG/1.5ML ~~LOC~~ SOPN: 0.2500 mg | PEN_INJECTOR | SUBCUTANEOUS | 3 refills | Status: DC

## 2020-12-18 NOTE — Assessment & Plan Note (Signed)
Pt has had this w/ at least 2 of her pregnancies.  This places her at increased risk for DMII in the future.  Will watch carefully.

## 2020-12-18 NOTE — Progress Notes (Signed)
   Subjective:    Patient ID: Michele Mcbride, female    DOB: 20-Mar-1985, 35 y.o.   MRN: [id number]  HPI Obesity- pt is now 188 lbs and BMI is 34.42.  Pt is frustrated b/c she had COVID weight gain, pregnancy weight gain.  Currently weighs more than when she went in for delivery.  Not currently breastfeeding.  Pt is very active w/ 3 children but no formal exercise.  Pt is attempting to eat 3 meals w/ small snacks throughout the day.  Had gestational DM w/ pregnancy.  A1C normalized after delivery.  Pt has some soda intake.  Pt reports increased appetite.  Pt is very upset about her weight and tearful today.  She is also fearful that her weight will lead to medical complications down the road.  She already has PCOS and metabolic syndrome   Review of Systems For ROS see HPI   This visit occurred during the SARS-CoV-2 public health emergency.  Safety protocols were in place, including screening questions prior to the visit, additional usage of staff PPE, and extensive cleaning of exam room while observing appropriate contact time as indicated for disinfecting solutions.      Objective:   Physical Exam Vitals reviewed.  Constitutional:      General: She is not in acute distress.    Appearance: She is well-developed. She is obese. She is not ill-appearing.  HENT:     Head: Normocephalic and atraumatic.  Eyes:     Conjunctiva/sclera: Conjunctivae normal.     Pupils: Pupils are equal, round, and reactive to light.  Neck:     Thyroid: No thyromegaly.  Cardiovascular:     Rate and Rhythm: Normal rate and regular rhythm.     Pulses: Normal pulses.     Heart sounds: Normal heart sounds. No murmur heard. Pulmonary:     Effort: Pulmonary effort is normal. No respiratory distress.     Breath sounds: Normal breath sounds.  Abdominal:     General: There is no distension.     Palpations: Abdomen is soft.     Tenderness: There is no abdominal tenderness.  Musculoskeletal:     Cervical back: Normal  range of motion and neck supple.     Right lower leg: No edema.     Left lower leg: No edema.  Lymphadenopathy:     Cervical: No cervical adenopathy.  Skin:    General: Skin is warm and dry.  Neurological:     Mental Status: She is alert and oriented to person, place, and time.  Psychiatric:        Mood and Affect: Mood normal.        Behavior: Behavior normal.     Comments: tearful          Assessment & Plan:

## 2020-12-18 NOTE — Assessment & Plan Note (Signed)
New.  Pt has hx of gestational DM (elevated blood sugar), last LDL was 110 (in 2018 before she gained the weight), and she has central obesity.  Based on this, it makes sense to start her on Ozempic to facilitate weight loss and improve her sugars.  Will check labs today to risk stratify and follow closely.

## 2020-12-18 NOTE — Patient Instructions (Signed)
Follow up in 6-8 weeks to recheck weight loss We'll notify you of your lab results and make any changes if needed START the Ozempic once weekly Continue to work on healthy diet and regular exercise- you can do it! Call with any questions or concerns Hang in there!  You can do it!!

## 2020-12-18 NOTE — Assessment & Plan Note (Signed)
Deteriorated.  Pt is distraught today b/c this is the most she has ever weighed.  She does not have any extra money to join a weight loss program like [employer] or [employer] and w/ 3 kids (28, 10, and 7 months) she does not have time to exercise.  We discussed that there are medication options to help w/ weight loss but that needs to be in addition to diet and exercise.  We elected to go w/ Ozempic due to her hx of GDM.  Discussed appropriate use, possible side effects, and expected results.  Will check labs to risk stratify and will follow closely.  Pt expressed understanding and is in agreement w/ plan.

## 2020-12-19 LAB — CBC WITH DIFFERENTIAL/PLATELET
Absolute Monocytes: 556 cells/uL (ref 200–950)
Basophils Absolute: 33 cells/uL (ref 0–200)
Basophils Relative: 0.4 %
Eosinophils Absolute: 50 cells/uL (ref 15–500)
Eosinophils Relative: 0.6 %
HCT: 44.7 % (ref 35.0–45.0)
Hemoglobin: 15.3 g/dL (ref 11.7–15.5)
Lymphs Abs: 1627 cells/uL (ref 850–3900)
MCH: 31.2 pg (ref 27.0–33.0)
MCHC: 34.2 g/dL (ref 32.0–36.0)
MCV: 91 fL (ref 80.0–100.0)
MPV: 11.5 fL (ref 7.5–12.5)
Monocytes Relative: 6.7 %
Neutro Abs: 6034 cells/uL (ref 1500–7800)
Neutrophils Relative %: 72.7 %
Platelets: 279 10*3/uL (ref 140–400)
RBC: 4.91 10*6/uL (ref 3.80–5.10)
RDW: 12.1 % (ref 11.0–15.0)
Total Lymphocyte: 19.6 %
WBC: 8.3 10*3/uL (ref 3.8–10.8)

## 2020-12-19 LAB — LIPID PANEL
Cholesterol: 209 mg/dL — ABNORMAL HIGH (ref <200)
HDL: 54 mg/dL (ref >=50)
LDL Cholesterol (Calc): 136 mg/dL (calc) — ABNORMAL HIGH
Non-HDL Cholesterol (Calc): 155 mg/dL (calc) — ABNORMAL HIGH (ref <130)
Total CHOL/HDL Ratio: 3.9 (calc) (ref <5.0)
Triglycerides: 87 mg/dL (ref <150)

## 2020-12-19 LAB — BASIC METABOLIC PANEL
BUN: 14 mg/dL (ref 7–25)
CO2: 26 mmol/L (ref 20–32)
Calcium: 9.7 mg/dL (ref 8.6–10.2)
Chloride: 106 mmol/L (ref 98–110)
Creat: 0.87 mg/dL (ref 0.50–0.97)
Glucose, Bld: 99 mg/dL (ref 65–99)
Potassium: 3.8 mmol/L (ref 3.5–5.3)
Sodium: 142 mmol/L (ref 135–146)

## 2020-12-19 LAB — HEMOGLOBIN A1C
Hgb A1c MFr Bld: 5.5 % of total Hgb (ref <5.7)
Mean Plasma Glucose: 111 mg/dL
eAG (mmol/L): 6.2 mmol/L

## 2020-12-19 LAB — HEPATIC FUNCTION PANEL
AG Ratio: 1.7 (calc) (ref 1.0–2.5)
ALT: 10 U/L (ref 6–29)
AST: 11 U/L (ref 10–30)
Albumin: 4.6 g/dL (ref 3.6–5.1)
Alkaline phosphatase (APISO): 121 U/L (ref 31–125)
Bilirubin, Direct: 0.1 mg/dL (ref 0.0–0.2)
Globulin: 2.7 g/dL (calc) (ref 1.9–3.7)
Indirect Bilirubin: 0.2 mg/dL (calc) (ref 0.2–1.2)
Total Bilirubin: 0.3 mg/dL (ref 0.2–1.2)
Total Protein: 7.3 g/dL (ref 6.1–8.1)

## 2020-12-19 LAB — TSH: TSH: 1.36 mIU/L

## 2020-12-19 LAB — VITAMIN D 25 HYDROXY (VIT D DEFICIENCY, FRACTURES): Vit D, 25-Hydroxy: 23 ng/mL — ABNORMAL LOW (ref 30–100)

## 2020-12-21 ENCOUNTER — Encounter: Payer: Self-pay | Admitting: Family Medicine

## 2020-12-22 ENCOUNTER — Other Ambulatory Visit (HOSPITAL_COMMUNITY): Payer: Self-pay

## 2020-12-22 ENCOUNTER — Other Ambulatory Visit: Payer: Self-pay

## 2020-12-22 DIAGNOSIS — E559 Vitamin D deficiency, unspecified: Secondary | ICD-10-CM

## 2020-12-22 MED ORDER — VITAMIN D (ERGOCALCIFEROL) 1.25 MG (50000 UNIT) PO CAPS
50000.0000 [IU] | ORAL_CAPSULE | ORAL | 0 refills | Status: DC
  Filled 2020-12-22: qty 12, 84d supply, fill #0

## 2020-12-23 ENCOUNTER — Other Ambulatory Visit: Payer: Self-pay

## 2020-12-23 DIAGNOSIS — E559 Vitamin D deficiency, unspecified: Secondary | ICD-10-CM

## 2020-12-24 MED ORDER — PHENTERMINE HCL 37.5 MG PO CAPS: 37.5000 mg | ORAL_CAPSULE | ORAL | 0 refills | Status: DC

## 2020-12-25 ENCOUNTER — Other Ambulatory Visit: Payer: Self-pay

## 2020-12-25 ENCOUNTER — Other Ambulatory Visit (HOSPITAL_COMMUNITY): Payer: Self-pay

## 2020-12-25 DIAGNOSIS — E559 Vitamin D deficiency, unspecified: Secondary | ICD-10-CM

## 2020-12-25 MED ORDER — VITAMIN D (ERGOCALCIFEROL) 1.25 MG (50000 UNIT) PO CAPS: 50000.0000 [IU] | ORAL_CAPSULE | ORAL | 0 refills | Status: DC

## 2021-01-16 ENCOUNTER — Telehealth: Payer: Medicaid Other | Admitting: Nurse Practitioner

## 2021-01-16 DIAGNOSIS — B3731 Acute candidiasis of vulva and vagina: Secondary | ICD-10-CM | POA: Diagnosis not present

## 2021-01-16 MED ORDER — FLUCONAZOLE 150 MG PO TABS: 150.0000 mg | ORAL_TABLET | Freq: Once | ORAL | 1 refills | Status: AC

## 2021-01-16 NOTE — Progress Notes (Signed)
I have spent 5 minutes in review of e-visit questionnaire, review and updating patient chart, medical decision making and response to patient.  ° °Rushil Kimbrell W Verla Bryngelson, NP ° °  °

## 2021-01-16 NOTE — Progress Notes (Signed)

## 2021-02-05 ENCOUNTER — Ambulatory Visit: Payer: 59 | Admitting: Family Medicine

## 2021-02-19 ENCOUNTER — Ambulatory Visit (INDEPENDENT_AMBULATORY_CARE_PROVIDER_SITE_OTHER): Payer: Medicaid Other | Admitting: Family Medicine

## 2021-02-19 ENCOUNTER — Encounter: Payer: Self-pay | Admitting: Family Medicine

## 2021-02-19 VITALS — BP 118/82 | HR 83 | Temp 99.5°F | Resp 16 | Wt 174.0 lb

## 2021-02-19 DIAGNOSIS — R590 Localized enlarged lymph nodes: Secondary | ICD-10-CM | POA: Diagnosis not present

## 2021-02-19 DIAGNOSIS — F419 Anxiety disorder, unspecified: Secondary | ICD-10-CM

## 2021-02-19 DIAGNOSIS — E669 Obesity, unspecified: Secondary | ICD-10-CM

## 2021-02-19 MED ORDER — ALPRAZOLAM 0.5 MG PO TABS: 0.5000 mg | ORAL_TABLET | Freq: Two times a day (BID) | ORAL | 1 refills | Status: DC | PRN

## 2021-02-19 NOTE — Assessment & Plan Note (Signed)
Deteriorated.  Pt is having a hard time at work and having some financial issues w/ her mortgage and the fact that the loan holder is her ex-inlaws.  She doesn't feel like she needs a daily medication but would like to have Alprazolam on hand again to use as needed.  Refill provided.

## 2021-02-19 NOTE — Assessment & Plan Note (Signed)
New.  Firm, mobile, no TTP.  Pt denies recent illness or infxn.  Suspect this is reactive LAD and should improve w/ time.  Will watch for the next few weeks and if not improving will get [place] to assess.  Pt expressed understanding and is in agreement w/ plan.

## 2021-02-19 NOTE — Progress Notes (Signed)
Subjective:    Patient ID: KAMY POINSETT, female    DOB: 07/01/1985, 35 y.o.   MRN: [id number]  HPI Obesity- pt is down 14 lbs since last visit due to starting phentermine.  No palpitations, insomnia.  Some dry mouth but this increases her water intake.  Pt is not following any particular diet plan.  Has tried to limit sweets and work on portion control.  Has been trying to walk regularly and now that weather is cold plans to start [place] videos.  No CP, SOB, HAs, visual changes.  R submandibular lump- first noticed a few weeks ago.  Not painful.  No recent illness or infxn.  Not enlarging in size but not shrinking either.  Anxiety- pt reports sxs have worsened recently.  Husband's grandmother passed away and she was the lender on her mortgage.  This is pt's ex-husband so this makes things complicated.  Work is very [employer].  Pt is hesitant to start medication on a daily basis b/c 'i don't feel it every day'.  Pt worries that she is becoming more irritable and is fearful of snapping at kids.  She has been on Alprazolam previously.  Review of Systems For ROS see HPI   This visit occurred during the SARS-CoV-2 public health emergency.  Safety protocols were in place, including screening questions prior to the visit, additional usage of staff PPE, and extensive cleaning of exam room while observing appropriate contact time as indicated for disinfecting solutions.      Objective:   Physical Exam Vitals reviewed.  Constitutional:      General: She is not in acute distress.    Appearance: Normal appearance. She is well-developed. She is not ill-appearing.  HENT:     Head: Normocephalic and atraumatic.  Eyes:     Conjunctiva/sclera: Conjunctivae normal.     Pupils: Pupils are equal, round, and reactive to light.  Neck:     Thyroid: No thyromegaly.  Cardiovascular:     Rate and Rhythm: Normal rate and regular rhythm.     Heart sounds: Normal heart sounds. No murmur heard. Pulmonary:      Effort: Pulmonary effort is normal. No respiratory distress.     Breath sounds: Normal breath sounds.  Abdominal:     General: There is no distension.     Palpations: Abdomen is soft.     Tenderness: There is no abdominal tenderness.  Musculoskeletal:     Cervical back: Normal range of motion and neck supple.  Lymphadenopathy:     Head:     Right side of head: Submandibular adenopathy present. No submental, tonsillar, preauricular, posterior auricular or occipital adenopathy.     Left side of head: No submental, submandibular, tonsillar, preauricular, posterior auricular or occipital adenopathy.     Cervical: No cervical adenopathy.     Right cervical: No superficial, deep or posterior cervical adenopathy.    Left cervical: No superficial, deep or posterior cervical adenopathy.     Upper Body:     Right upper body: No supraclavicular adenopathy.     Left upper body: No supraclavicular adenopathy.  Skin:    General: Skin is warm and dry.  Neurological:     General: No focal deficit present.     Mental Status: She is alert and oriented to person, place, and time.  Psychiatric:        Mood and Affect: Mood normal.        Behavior: Behavior normal.  Assessment & Plan:

## 2021-02-19 NOTE — Assessment & Plan Note (Signed)
Pt is down 14 lbs since starting Phentermine.  Denies palpitations, insomnia.  Is trying to be more physically active and improve both her diet content and portions.  Applauded her efforts.  Will continue to follow.

## 2021-02-19 NOTE — Patient Instructions (Addendum)
Schedule your complete physical in 3 months Continue to work on healthy diet and regular exercise- you're doing great!! If the lump is still present in the new year, let me know and we'll get an ultrasound to assess Use the Alprazolam as needed for anxiety Call with any questions or concerns Stay Safe!  Stay Healthy! Happy Holidays!!!

## 2021-03-05 ENCOUNTER — Telehealth: Payer: Medicaid Other | Admitting: Physician Assistant

## 2021-03-05 DIAGNOSIS — M545 Low back pain, unspecified: Secondary | ICD-10-CM | POA: Diagnosis not present

## 2021-03-05 MED ORDER — NAPROXEN 500 MG PO TABS: 500.0000 mg | ORAL_TABLET | Freq: Two times a day (BID) | ORAL | 0 refills | Status: DC

## 2021-03-05 MED ORDER — CYCLOBENZAPRINE HCL 10 MG PO TABS: 10.0000 mg | ORAL_TABLET | Freq: Three times a day (TID) | ORAL | 0 refills | Status: DC | PRN

## 2021-03-05 NOTE — Progress Notes (Signed)

## 2021-03-05 NOTE — Progress Notes (Signed)
I have spent 5 minutes in review of e-visit questionnaire, review and updating patient chart, medical decision making and response to patient.   Ava Deguire Cody Elba Schaber, PA-C    

## 2021-03-09 ENCOUNTER — Encounter: Payer: Self-pay | Admitting: Family Medicine

## 2021-03-09 DIAGNOSIS — R590 Localized enlarged lymph nodes: Secondary | ICD-10-CM

## 2021-03-19 ENCOUNTER — Other Ambulatory Visit: Payer: Medicaid Other

## 2021-03-19 ENCOUNTER — Encounter: Payer: Self-pay | Admitting: Family Medicine

## 2021-03-19 MED ORDER — PROMETHAZINE HCL 25 MG PO TABS: 25.0000 mg | ORAL_TABLET | Freq: Three times a day (TID) | ORAL | 0 refills | Status: DC | PRN

## 2021-03-30 ENCOUNTER — Encounter: Payer: Self-pay | Admitting: Neurology

## 2021-04-12 ENCOUNTER — Other Ambulatory Visit: Payer: Self-pay

## 2021-04-12 ENCOUNTER — Ambulatory Visit
Admission: RE | Admit: 2021-04-12 | Discharge: 2021-04-12 | Disposition: A | Discharge status: Home / Self Care | Payer: Medicaid Other | Source: Ambulatory Visit | Attending: Family Medicine | Admitting: Family Medicine

## 2021-04-12 DIAGNOSIS — R590 Localized enlarged lymph nodes: Secondary | ICD-10-CM

## 2021-04-13 ENCOUNTER — Other Ambulatory Visit: Payer: Self-pay | Admitting: Family Medicine

## 2021-04-13 DIAGNOSIS — R221 Localized swelling, mass and lump, neck: Secondary | ICD-10-CM

## 2021-04-15 ENCOUNTER — Other Ambulatory Visit: Payer: Self-pay

## 2021-04-15 ENCOUNTER — Encounter: Payer: Self-pay | Admitting: Family Medicine

## 2021-04-15 ENCOUNTER — Ambulatory Visit
Admission: RE | Admit: 2021-04-15 | Discharge: 2021-04-15 | Disposition: A | Discharge status: Home / Self Care | Payer: 59 | Source: Ambulatory Visit | Attending: Oncology | Admitting: Oncology

## 2021-04-15 DIAGNOSIS — Z1239 Encounter for other screening for malignant neoplasm of breast: Secondary | ICD-10-CM

## 2021-04-15 DIAGNOSIS — Z9189 Other specified personal risk factors, not elsewhere classified: Secondary | ICD-10-CM

## 2021-04-16 ENCOUNTER — Other Ambulatory Visit: Payer: Medicaid Other

## 2021-04-16 ENCOUNTER — Ambulatory Visit: Payer: 59 | Admitting: Hematology and Oncology

## 2021-04-16 ENCOUNTER — Other Ambulatory Visit: Payer: 59

## 2021-04-16 ENCOUNTER — Ambulatory Visit: Payer: Self-pay | Admitting: Hematology and Oncology

## 2021-04-26 ENCOUNTER — Ambulatory Visit
Admission: RE | Admit: 2021-04-26 | Discharge: 2021-04-26 | Disposition: A | Discharge status: Home / Self Care | Payer: Medicaid Other | Source: Ambulatory Visit | Attending: Family Medicine | Admitting: Family Medicine

## 2021-04-26 DIAGNOSIS — T8130XA Disruption of wound, unspecified, initial encounter: Secondary | ICD-10-CM | POA: Diagnosis not present

## 2021-04-26 DIAGNOSIS — E041 Nontoxic single thyroid nodule: Secondary | ICD-10-CM | POA: Diagnosis not present

## 2021-04-26 DIAGNOSIS — R221 Localized swelling, mass and lump, neck: Secondary | ICD-10-CM

## 2021-04-26 MED ORDER — IOPAMIDOL (ISOVUE-300) INJECTION 61%
75.0000 mL | Freq: Once | INTRAVENOUS | Status: AC | PRN
  Administered 2021-04-26: 75 mL via INTRAVENOUS

## 2021-04-30 ENCOUNTER — Other Ambulatory Visit: Payer: Self-pay | Admitting: *Deleted

## 2021-04-30 DIAGNOSIS — Z1239 Encounter for other screening for malignant neoplasm of breast: Secondary | ICD-10-CM

## 2021-04-30 NOTE — Progress Notes (Signed)
Patient Care Team: Midge Minium, MD as PCP - General (Family Medicine) Lahoma Crocker, MD as Consulting Physician (Obstetrics and Gynecology) Magrinat, Virgie Dad, MD (Inactive) as Consulting Physician (Oncology) Harold Hedge, Darrick Grinder, MD as Consulting Physician (Allergy and Immunology) Melvenia Beam, MD as Consulting Physician (Neurology) Lahoma Crocker, MD as Consulting Physician (Obstetrics and Gynecology) Eyvonne Mechanic as Counselor (Licensed Clinical Social Worker)  DIAGNOSIS:    ICD-10-CM   1. 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 with above-mentioned history of high risk for breast cancer. Mammogram on 04/15/2021 showed no evidence of malignancy. She presents to the clinic today for follow-up.  She has been tolerating tamoxifen extremely well without any problems or concerns but denies any hot flashes or arthralgias or myalgias. Tamoxifen was held until she had her baby.  Her child is now almost-year-old.  ALLERGIES:  is allergic to strawberry extract and latex.  MEDICATIONS:  Current Outpatient Medications  Medication Sig Dispense Refill   albuterol (VENTOLIN HFA) 108 (90 Base) MCG/ACT inhaler Inhale 2 puffs into the lungs every 6 (six) hours as needed for wheezing or shortness of breath. 8 g 0   ALPRAZolam (XANAX) 0.5 MG tablet Take 1 tablet (0.5 mg total) by mouth 2 (two) times daily as needed for anxiety. 30 tablet 1   cyclobenzaprine (FLEXERIL) 10 MG tablet Take 1 tablet (10 mg total) by mouth 3 (three) times daily as needed for muscle spasms. 15 tablet 0   fluticasone (FLONASE) 50 MCG/ACT nasal spray Place 2 sprays into both nostrils daily. 16 g 6   naproxen (NAPROSYN) 500 MG tablet Take 1 tablet (500 mg total) by mouth 2 (two) times daily with a meal. 20 tablet 0    norethindrone (MICRONOR) 0.35 MG tablet Take 1 tablet by mouth daily 84 tablet 3   phentermine 37.5 MG capsule Take 1 capsule (37.5 mg total) by mouth every morning. 90 capsule 0   promethazine (PHENERGAN) 25 MG tablet Take 1 tablet (25 mg total) by mouth every 8 (eight) hours as needed for nausea or vomiting. 20 tablet 0   tamoxifen (NOLVADEX) 20 MG tablet Take 20 mg by mouth daily.     Vitamin D, Ergocalciferol, (DRISDOL) 1.25 MG (50000 UNIT) CAPS capsule Take 1 capsule by mouth every 7 days. 12 capsule 0   Current Facility-Administered Medications  Medication Dose Route Frequency Provider Last Rate Last Admin   terbinafine (LAMISIL) 1 % cream   Topical BID Hedges, Dellis Filbert, PA-C        PHYSICAL EXAMINATION: ECOG PERFORMANCE STATUS: 1 - Symptomatic but completely ambulatory  Vitals:   05/03/21 1443  BP: 128/75  Pulse: 92  Resp: 18  Temp: 97.7 F (36.5 C)  SpO2: 99%   Filed Weights   05/03/21 1443  Weight: 167 lb 12.8 oz (76.1 kg)      LABORATORY DATA:  I have reviewed the data as listed CMP Latest Ref Rng & Units 05/03/2021 12/18/2020 11/14/2019  Glucose 70 - 99 mg/dL 95 99 122(H)  BUN 6 - 20 mg/dL 11 14 <5(L)  Creatinine 0.44 - 1.00 mg/dL 0.97 0.87 0.55  Sodium 135 - 145 mmol/L 145 142 138  Potassium 3.5 - 5.1 mmol/L 3.7 3.8 3.3(L)  Chloride 98 - 111 mmol/L 112(H) 106 105  CO2 22 -  32 mmol/L '26 26 24  ' Calcium 8.9 - 10.3 mg/dL 9.4 9.7 9.3  Total Protein 6.5 - 8.1 g/dL 7.3 7.3 6.6  Total Bilirubin 0.3 - 1.2 mg/dL 0.4 0.3 0.2(L)  Alkaline Phos 38 - 126 U/L 78 - 68  AST 15 - 41 U/L 11(L) 11 17  ALT 0 - 44 U/L '12 10 14    ' Lab Results  Component Value Date   WBC 6.3 05/03/2021   HGB 13.9 05/03/2021   HCT 41.4 05/03/2021   MCV 92.6 05/03/2021   PLT 235 05/03/2021   NEUTROABS 3.9 05/03/2021    ASSESSMENT & PLAN:  Breast cancer screening, high risk patient CHEK2 gene, called c.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  Breast MRI 11/03/2020: Benign breast density  category B 2. 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  Her baby is almost a-year-old.  She resumed tamoxifen in August 2022.   04/13/2021: Evaluation of palpable right submandibular mass: Complex lesion 1.8 cm (possible celebrity mass versus abnormally enlarged lymph node)   Return to clinic in 1 year for follow-up.    No orders of the defined types were placed in this encounter.  The patient has a good understanding of the overall plan. she agrees with it. she will call with any problems that may develop before the next visit here.  Total time spent: 30 mins including face to face time and time spent for planning, charting and coordination of care  Rulon Eisenmenger, MD, MPH 05/03/2021  I, Thana Ates, am acting as scribe for Dr. Nicholas Lose.  I have reviewed the above documentation for accuracy and completeness, and I agree with the above.

## 2021-05-03 ENCOUNTER — Inpatient Hospital Stay (HOSPITAL_BASED_OUTPATIENT_CLINIC_OR_DEPARTMENT_OTHER): Payer: 59 | Admitting: Hematology and Oncology

## 2021-05-03 ENCOUNTER — Inpatient Hospital Stay: Payer: 59 | Attending: Hematology and Oncology

## 2021-05-03 ENCOUNTER — Other Ambulatory Visit: Payer: Self-pay

## 2021-05-03 DIAGNOSIS — Z1239 Encounter for other screening for malignant neoplasm of breast: Secondary | ICD-10-CM

## 2021-05-03 DIAGNOSIS — Z79810 Long term (current) use of selective estrogen receptor modulators (SERMs): Secondary | ICD-10-CM | POA: Insufficient documentation

## 2021-05-03 LAB — CBC WITH DIFFERENTIAL (CANCER CENTER ONLY)
Abs Immature Granulocytes: 0.01 K/uL (ref 0.00–0.07)
Basophils Absolute: 0 K/uL (ref 0.0–0.1)
Basophils Relative: 0 %
Eosinophils Absolute: 0.1 K/uL (ref 0.0–0.5)
Eosinophils Relative: 1 %
HCT: 41.4 % (ref 36.0–46.0)
Hemoglobin: 13.9 g/dL (ref 12.0–15.0)
Immature Granulocytes: 0 %
Lymphocytes Relative: 30 %
Lymphs Abs: 1.9 K/uL (ref 0.7–4.0)
MCH: 31.1 pg (ref 26.0–34.0)
MCHC: 33.6 g/dL (ref 30.0–36.0)
MCV: 92.6 fL (ref 80.0–100.0)
Monocytes Absolute: 0.4 K/uL (ref 0.1–1.0)
Monocytes Relative: 6 %
Neutro Abs: 3.9 K/uL (ref 1.7–7.7)
Neutrophils Relative %: 63 %
Platelet Count: 235 K/uL (ref 150–400)
RBC: 4.47 MIL/uL (ref 3.87–5.11)
RDW: 13.2 % (ref 11.5–15.5)
WBC Count: 6.3 K/uL (ref 4.0–10.5)
nRBC: 0 % (ref 0.0–0.2)

## 2021-05-03 LAB — CMP (CANCER CENTER ONLY)
ALT: 12 U/L (ref 0–44)
AST: 11 U/L — ABNORMAL LOW (ref 15–41)
Albumin: 4.3 g/dL (ref 3.5–5.0)
Alkaline Phosphatase: 78 U/L (ref 38–126)
Anion gap: 7 (ref 5–15)
BUN: 11 mg/dL (ref 6–20)
CO2: 26 mmol/L (ref 22–32)
Calcium: 9.4 mg/dL (ref 8.9–10.3)
Chloride: 112 mmol/L — ABNORMAL HIGH (ref 98–111)
Creatinine: 0.97 mg/dL (ref 0.44–1.00)
GFR, Estimated: >60 mL/min
Glucose, Bld: 95 mg/dL (ref 70–99)
Potassium: 3.7 mmol/L (ref 3.5–5.1)
Sodium: 145 mmol/L (ref 135–145)
Total Bilirubin: 0.4 mg/dL (ref 0.3–1.2)
Total Protein: 7.3 g/dL (ref 6.5–8.1)

## 2021-05-03 MED ORDER — TAMOXIFEN CITRATE 20 MG PO TABS: 20.0000 mg | ORAL_TABLET | Freq: Every day | ORAL | 3 refills | Status: DC

## 2021-05-03 NOTE — Assessment & Plan Note (Signed)
CHEK2 gene, called c.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  Breast MRI 11/03/2020: Benign breast density category B 2. 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

## 2021-05-04 ENCOUNTER — Telehealth: Payer: Self-pay | Admitting: Hematology and Oncology

## 2021-05-04 NOTE — Telephone Encounter (Signed)
Scheduled appointment per 2/27 los. Patient is aware.

## 2021-05-18 ENCOUNTER — Telehealth: Payer: Self-pay | Admitting: *Deleted

## 2021-05-18 ENCOUNTER — Other Ambulatory Visit (HOSPITAL_COMMUNITY): Payer: Self-pay

## 2021-05-18 ENCOUNTER — Ambulatory Visit (INDEPENDENT_AMBULATORY_CARE_PROVIDER_SITE_OTHER): Payer: 59 | Admitting: Neurology

## 2021-05-18 ENCOUNTER — Telehealth: Payer: Self-pay | Admitting: Neurology

## 2021-05-18 ENCOUNTER — Encounter: Payer: Self-pay | Admitting: Neurology

## 2021-05-18 VITALS — BP 130/91 | HR 75 | Ht 62.0 in | Wt 165.5 lb

## 2021-05-18 DIAGNOSIS — G43009 Migraine without aura, not intractable, without status migrainosus: Secondary | ICD-10-CM

## 2021-05-18 MED ORDER — CYCLOBENZAPRINE HCL 10 MG PO TABS
10.0000 mg | ORAL_TABLET | Freq: Three times a day (TID) | ORAL | 3 refills | Status: DC | PRN
  Filled 2021-05-18: qty 90, 30d supply, fill #0
  Filled 2021-06-17: qty 90, 30d supply, fill #1
  Filled 2021-08-18: qty 90, 30d supply, fill #2
  Filled 2021-10-25: qty 90, 30d supply, fill #3

## 2021-05-18 MED ORDER — UBRELVY 100 MG PO TABS
100.0000 mg | ORAL_TABLET | ORAL | 11 refills | Status: DC | PRN
  Filled 2021-05-18: qty 16, 30d supply, fill #0
  Filled 2021-06-17: qty 16, 30d supply, fill #1
  Filled 2021-07-24: qty 16, 30d supply, fill #2
  Filled 2021-08-18: qty 16, 30d supply, fill #3
  Filled 2021-09-15: qty 16, 30d supply, fill #4
  Filled 2021-10-25: qty 16, 30d supply, fill #5
  Filled 2021-11-27: qty 16, 30d supply, fill #6
  Filled 2022-01-18: qty 16, 30d supply, fill #7
  Filled 2022-02-25: qty 16, 30d supply, fill #8
  Filled 2022-04-11: qty 16, 30d supply, fill #9

## 2021-05-18 MED ORDER — AIMOVIG 140 MG/ML ~~LOC~~ SOAJ
140.0000 mg | SUBCUTANEOUS | 0 refills | Status: DC
Start: 2021-05-18 — End: 2021-05-21

## 2021-05-18 NOTE — Progress Notes (Signed)
?[facility] ? ? ? ?Provider:  Dr Jaynee Eagles ?Referring Provider: Dr. Patrecia Pour ?Primary Care Physician:  Dr. Patrecia Pour ? ?CC:  Migraines ? ?Interval history 05/18/2021: Patient returns today for follow-up, have not seen her since 2021 when she was pregnant and had stopped most of her medications.  Prior to that she was on Topamax and Aimovig.  She has had headaches/migraines since the age of 60, started medications at the age of 3 and patient has been to multiple neurologist.  We started her on the CGRP medications and she did extremely well.  She was very happy with Aimovig and Trokendi. She did well the year after pregnancy, she breast fed for 6 months. She has had more stress in the last 3 months and migraines have worsened. She was out of work last Friday for a terrible migraine. In the last 2 weeks she has had 2 migraines, but she has had ncreasing headache frequency every day for 2 weeks, prior to the last 2 weeks it was more like 4-6 total headache days a month. Currently 4 migraine days a month + headaches = between 4-14 total headache days a month. Discussed options, prevention vs acute. ? ?Patient complains of symptoms per HPI as well as the following symptoms: stress . Pertinent negatives and positives per HPI. All others negative ? ? ?Interval history 10/14/2019; patient is pregnant and due in March. She had stopped most of her medications including the topamax and aimovig, in fact most medications were stopped in April. She is [redacted] weeks pregnant and have seen the ultrasound, this was a surprise due to her pcos. In July she had several days of headaches, having 5 headache days. We discussed pregnancy and migraines, no medictaion is safe, I gave her literature and we reviewed the possible migraine preventatives and acute medications and risks in pregnancy. ? ?Patient complains of symptoms per HPI as well as the following symptoms: headache, fatigue, stress, pregnancy . Pertinent negatives and  positives per HPI. All others negative ? ? ?Interval history 10/08/2018: This is a 36 year old patient who is here for follow-up of migraines.  She has had migraine since the age of 67.  Started medications at the age of 38 and been to multiple neurologists.  She has been doing very well on CGRP medications the last year.  She has been under stress in the past.  Aimovig and Trokendi have been very successful.  We did fill out her FMLA forms and we will repeat those for the next year. Lots of stress but different than last year. 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  ? ?Treid: axert, maxalt, sumatriptan, amerge worked the best, trig zomig po and nasal, topiramate, amitriptyline and nortriptyline,Topamax currently, propranolol, fioricetl, exapro, zofran, imitrex, axert, relpax, has been on Topamax 245m, reglan, robaxin, tizanidine, Lexapro ? ?Interval history 12/11/2017: She is under a lot of stress, her parents moved in with her. She supports 5 people. Her ex-husband lost his job and so decreased child support. She is on Aimovig. She does not want to have botox completed. Discussed strategies for stress management. She feels the Aimovig is improving her migraines for now. Will continue on the Trokendi. ? ?HPI:  Michele MICHNAis a 36y.o. female here as a referral from Dr. TBirdie Riddlefor migraines. Suffering since the age of n73 Started medications at the age of 89 She has seen previous neurologists. She wakes with migraines and worse bending over  and with valsalva. Can take her out of work for 3 days. She has light sensitivity, sound sensitivity, nausea, vomiting, worse with movement, its unilateral either side may spread to the whole head, pounding throbbing, can be severe. 22 headache days a month, 5 severe migraine days, 5-7 moderately severe migraines, 10 mild migraines or dull headaches. Can worsen with bending over and valsalva. No aura, + vision changes, no  weakness or numbness. No medication overuse. This frequency and severity has been ongoing for a year and intractable. Mother, sister,2 aunts, cousin, grandmother have migraines. She has associated neck pain/ No other focal neurologic deficits, associated symptoms, inciting events or modifiable factors. She has examined food triggers, red wine trigers, garlic, sleeping well and getting enough sleep, exercises. No other focal neurologic deficits, associated symptoms, inciting events or modifiable factors. ? ?Medications tried: Topamax currently, propranolol, fioricetl, exapro, zofran, imitrex, axert, relpax, has been on Topamax 236m, reglan, robaxin, tizanidine, Lexapro ? ?Reviewed notes, labs and imaging from outside physicians, which showed: ? ?TSH normal ? ?Dg cervical spine 2004: reviewed report: ? ?IMPRESSION ?NEGATIVE FOR FRACTURE. ?LUMBAR SPINE 4 VIEWS ?THE PATIENT HAS FIVE NON-RIB-BEARING LUMBAR VERTEBRAE.  THE ALIGNMENT IS ANATOMIC.  POSTERIOR ?ELEMENTS ARE INTACT.  SACROILIAC JOINTS ARE SYMMETRIC.  THE SACRUM IS NORMAL. ?IMPRESSION ?NEGATIVE FOR FRACTURE. ? ?Review of Systems: ?Patient complains of symptoms per HPI as well as the following symptoms: headache. Pertinent negatives and positives per HPI. All others negative. ? ? ?Social History  ? ?Socioeconomic History  ? Marital status: Divorced  ?  Spouse name: Not on file  ? Number of children: 2  ? Years of education: LPN  ? Highest education level: Not on file  ?Occupational History  ? Not on file  ?Tobacco Use  ? Smoking status: Never  ? Smokeless tobacco: Never  ?Vaping Use  ? Vaping Use: Never used  ?Substance and Sexual Activity  ? Alcohol use: No  ? Drug use: No  ? Sexual activity: Yes  ?Other Topics Concern  ? Not on file  ?Social History Narrative  ? Lives at home with her 2 children  ? Right handed  ? Caffeine: 2 cups daily at max  ? ?Social Determinants of Health  ? ?Financial Resource Strain: Not on file  ?Food Insecurity: Not on file   ?Transportation Needs: Not on file  ?Physical Activity: Not on file  ?Stress: Not on file  ?Social Connections: Not on file  ?Intimate Partner Violence: Not on file  ? ? ?Family History  ?Problem Relation Age of Onset  ? Hyperlipidemia Mother   ? Neuropathy Mother   ? Migraines Mother   ? Breast cancer Mother 318 ? Migraines Other   ?     sisters, aunts, and grandmother had migraines on mother's side  ? Breast cancer Paternal Aunt   ? ? ?Past Medical History:  ?Diagnosis Date  ? Anxiety   ? Asthma   ? Family history of breast cancer   ? Gestational diabetes   ? metformin  ? Migraine   ? Monoallelic mutation of CHEK2 gene in female patient   ? PCOS (polycystic ovarian syndrome)   ? Sinus infection   ? ? ?Past Surgical History:  ?Procedure Laterality Date  ? BREAST LUMPECTOMY WITH RADIOACTIVE SEED LOCALIZATION Bilateral 06/20/2019  ? Procedure: BILATERAL BREAST LUMPECTOMY WITH RADIOACTIVE SEED LOCALIZATION, Right x 2 and Left x1;  Surgeon: CErroll Luna MD;  Location: [facility]  Service: General;  Laterality: Bilateral;  ? WISDOM  TOOTH EXTRACTION    ? ? ?Current Outpatient Medications  ?Medication Sig Dispense Refill  ? albuterol (VENTOLIN HFA) 108 (90 Base) MCG/ACT inhaler Inhale 2 puffs into the lungs every 6 (six) hours as needed for wheezing or shortness of breath. 8 g 0  ? ALPRAZolam (XANAX) 0.5 MG tablet Take 1 tablet (0.5 mg total) by mouth 2 (two) times daily as needed for anxiety. 30 tablet 1  ? fluticasone (FLONASE) 50 MCG/ACT nasal spray Place 2 sprays into both nostrils daily. 16 g 6  ? norethindrone (MICRONOR) 0.35 MG tablet Take 1 tablet by mouth daily 84 tablet 3  ? promethazine (PHENERGAN) 25 MG tablet Take 1 tablet (25 mg total) by mouth every 8 (eight) hours as needed for nausea or vomiting. 20 tablet 0  ? tamoxifen (NOLVADEX) 20 MG tablet Take 1 tablet (20 mg total) by mouth daily. 90 tablet 3  ? VITAMIN D PO Take 2,000 Units by mouth daily. OTC    ? ?Current  Facility-Administered Medications  ?Medication Dose Route Frequency Provider Last Rate Last Admin  ? terbinafine (LAMISIL) 1 % cream   Topical BID Okey Regal, PA-C      ? ? ?Allergies as of 05/18/2021 - Review Complete 05/18/2021  ?

## 2021-05-18 NOTE — Telephone Encounter (Signed)
Completed Roselyn Meier PA on Cover My Meds. Key: [id number]. Awaiting determination from [place].  ?

## 2021-05-18 NOTE — Patient Instructions (Addendum)
Start Ubrelvy prn ?If needed start Aimovig and mychart me for prescription ? ?Erenumab injection ?What is this medication? ?ERENUMAB (e REN ue mab) is used to prevent migraine headaches. ?This medicine may be used for other purposes; ask your health care provider or pharmacist if you have questions. ?COMMON BRAND NAME(S): Aimovig ?What should I tell my care team before I take this medication? ?They need to know if you have any of these conditions: ?an unusual or allergic reaction to erenumab, latex, other medicines, foods, dyes, or preservatives ?high blood pressure ?pregnant or trying to get pregnant ?breast-feeding ?How should I use this medication? ?This medicine is for injection under the skin. You will be taught how to prepare and give this medicine. Use exactly as directed. Take your medicine at regular intervals. Do not take your medicine more often than directed. ?It is important that you put your used needles and syringes in a special sharps container. Do not put them in a trash can. If you do not have a sharps container, call your pharmacist or healthcare provider to get one. ?Talk to your pediatrician regarding the use of this medicine in children. Special care may be needed. ?Overdosage: If you think you have taken too much of this medicine contact a poison control center or emergency room at once. ?NOTE: This medicine is only for you. Do not share this medicine with others. ?What if I miss a dose? ?If you miss a dose, take it as soon as you can. If it is almost time for your next dose, take only that dose. Do not take double or extra doses. ?What may interact with this medication? ?Interactions are not expected. ?This list may not describe all possible interactions. Give your health care provider a list of all the medicines, herbs, non-prescription drugs, or dietary supplements you use. Also tell them if you smoke, drink alcohol, or use illegal drugs. Some items may interact with your medicine. ?What  should I watch for while using this medication? ?Tell your doctor or healthcare professional if your symptoms do not start to get better or if they get worse. ?What side effects may I notice from receiving this medication? ?Side effects that you should report to your doctor or health care professional as soon as possible: ?allergic reactions like skin rash, itching or hives, swelling of the face, lips, or tongue ?chest pain ?fast, irregular heartbeat ?feeling faint or lightheaded ?palpitations ?Side effects that usually do not require medical attention (report these to your doctor or health care professional if they continue or are bothersome): ?constipation ?muscle cramps ?pain, redness, or irritation at site where injected ?This list may not describe all possible side effects. Call your doctor for medical advice about side effects. You may report side effects to FDA at 1-800-FDA-1088. ?Where should I keep my medication? ?Keep out of the reach of children. ?You will be instructed on how to store this medicine. Throw away any unused medicine after the expiration date on the label. ?NOTE: This sheet is a summary. It may not cover all possible information. If you have questions about this medicine, talk to your doctor, pharmacist, or health care provider. ?? 2022 Elsevier/Gold Standard (2018-07-10 00:00:00) ? ?Ubrogepant tablets ?What is this medication? ?UBROGEPANT (ue BROE je pant) is used to treat migraine headaches with or without aura. An aura is a strange feeling or visual disturbance that warns you of an attack. It is not used to prevent migraines. ?This medicine may be used for other purposes; ask your  health care provider or pharmacist if you have questions. ?COMMON BRAND NAME(S): Ubrelvy ?What should I tell my care team before I take this medication? ?They need to know if you have any of these conditions: ?kidney disease ?liver disease ?an unusual or allergic reaction to ubrogepant, other medicines, foods,  dyes, or preservatives ?pregnant or trying to get pregnant ?breast-feeding ?How should I use this medication? ?Take this medicine by mouth with a glass of water. Follow the directions on the prescription label. You can take it with or without food. If it upsets your stomach, take it with food. Take your medicine at regular intervals. Do not take it more often than directed. Do not stop taking except on your doctor's advice. ?Talk to your pediatrician about the use of this medicine in children. Special care may be needed. ?Overdosage: If you think you have taken too much of this medicine contact a poison control center or emergency room at once. ?NOTE: This medicine is only for you. Do not share this medicine with others. ?What if I miss a dose? ?This does not apply. This medicine is not for regular use. ?What may interact with this medication? ?Do not take this medicine with any of the following medicines: ?ceritinib ?certain antibiotics like chloramphenicol, clarithromycin, telithromycin ?certain antivirals for HIV like atazanavir, cobicistat, darunavir, delavirdine, fosamprenavir, indinavir, ritonavir ?certain medicines for fungal infections like itraconazole, ketoconazole, posaconazole, voriconazole ?conivaptan ?grapefruit ?idelalisib ?mifepristone ?nefazodone ?ribociclib ?This medicine may also interact with the following medications: ?carvedilol ?certain medicines for seizures like phenobarbital, phenytoin ?ciprofloxacin ?cyclosporine ?eltrombopag ?fluconazole ?fluvoxamine ?quinidine ?rifampin ?St. John's wort ?verapamil ?This list may not describe all possible interactions. Give your health care provider a list of all the medicines, herbs, non-prescription drugs, or dietary supplements you use. Also tell them if you smoke, drink alcohol, or use illegal drugs. Some items may interact with your medicine. ?What should I watch for while using this medication? ?Visit your health care professional for regular checks  on your progress. Tell your health care professional if your symptoms do not start to get better or if they get worse. ?Your mouth may get dry. Chewing sugarless gum or sucking hard candy and drinking plenty of water may help. Contact your health care professional if the problem does not go away or is severe. ?What side effects may I notice from receiving this medication? ?Side effects that you should report to your doctor or health care professional as soon as possible: ?allergic reactions like skin rash, itching or hives; swelling of the face, lips, or tongue ?Side effects that usually do not require medical attention (report these to your doctor or health care professional if they continue or are bothersome): ?drowsiness ?dry mouth ?nausea ?tiredness ?This list may not describe all possible side effects. Call your doctor for medical advice about side effects. You may report side effects to FDA at 1-800-FDA-1088. ?Where should I keep my medication? ?Keep out of the reach of children. Store at room temperature between 15 and 30 degrees C (59 and 86 degrees F). Throw away any unused medicine after the expiration date. ?NOTE: This sheet is a summary. It may not cover all possible information. If you have questions about this medicine, talk to your doctor, pharmacist, or health care provider. ?? 2022 Elsevier/Gold Standard (2018-05-10 00:00:00) ? ?

## 2021-05-18 NOTE — Telephone Encounter (Signed)
Patient asked me to let nurses know that her pharmacy has already called her and she will need a PA for her Roselyn Meier prescription.  ?

## 2021-05-18 NOTE — Telephone Encounter (Signed)
Approved today ?The request has been approved. The authorization is effective for a maximum of 6 fills from 05/18/2021 to 11/17/2021, as long as the member is enrolled in their current health plan. The request was approved with a quantity restriction. This has been approved for a max daily dosage of 0. A written notification letter will follow with additional details.  ? ?Updated pt and faxed approval letter to pharmacy. Received a receipt of confirmation. ? ?

## 2021-05-21 ENCOUNTER — Ambulatory Visit (INDEPENDENT_AMBULATORY_CARE_PROVIDER_SITE_OTHER): Payer: Medicaid Other | Admitting: Family Medicine

## 2021-05-21 ENCOUNTER — Encounter: Payer: Self-pay | Admitting: Family Medicine

## 2021-05-21 ENCOUNTER — Other Ambulatory Visit (HOSPITAL_COMMUNITY): Payer: Self-pay

## 2021-05-21 VITALS — BP 114/80 | HR 75 | Temp 98.4°F | Resp 16 | Ht 61.5 in | Wt 165.0 lb

## 2021-05-21 DIAGNOSIS — J4541 Moderate persistent asthma with (acute) exacerbation: Secondary | ICD-10-CM | POA: Diagnosis not present

## 2021-05-21 DIAGNOSIS — E669 Obesity, unspecified: Secondary | ICD-10-CM

## 2021-05-21 DIAGNOSIS — E559 Vitamin D deficiency, unspecified: Secondary | ICD-10-CM | POA: Diagnosis not present

## 2021-05-21 DIAGNOSIS — Z0000 Encounter for general adult medical examination without abnormal findings: Secondary | ICD-10-CM

## 2021-05-21 LAB — CBC WITH DIFFERENTIAL/PLATELET
Basophils Absolute: 0 10*3/uL (ref 0.0–0.1)
Basophils Relative: 0.2 % (ref 0.0–3.0)
Eosinophils Absolute: 0 10*3/uL (ref 0.0–0.7)
Eosinophils Relative: 0.6 % (ref 0.0–5.0)
HCT: 42.6 % (ref 36.0–46.0)
Hemoglobin: 14.1 g/dL (ref 12.0–15.0)
Lymphocytes Relative: 29 % (ref 12.0–46.0)
Lymphs Abs: 2.1 10*3/uL (ref 0.7–4.0)
MCHC: 33 g/dL (ref 30.0–36.0)
MCV: 92.4 fl (ref 78.0–100.0)
Monocytes Absolute: 0.4 10*3/uL (ref 0.1–1.0)
Monocytes Relative: 5.6 % (ref 3.0–12.0)
Neutro Abs: 4.8 10*3/uL (ref 1.4–7.7)
Neutrophils Relative %: 64.6 % (ref 43.0–77.0)
Platelets: 186 10*3/uL (ref 150.0–400.0)
RBC: 4.61 Mil/uL (ref 3.87–5.11)
RDW: 13.3 % (ref 11.5–15.5)
WBC: 7.4 10*3/uL (ref 4.0–10.5)

## 2021-05-21 LAB — LIPID PANEL
Cholesterol: 220 mg/dL — ABNORMAL HIGH (ref 0–200)
HDL: 43.2 mg/dL (ref >39.00)
LDL Cholesterol: 152 mg/dL — ABNORMAL HIGH (ref 0–99)
NonHDL: 176.42
Total CHOL/HDL Ratio: 5
Triglycerides: 121 mg/dL (ref 0.0–149.0)
VLDL: 24.2 mg/dL (ref 0.0–40.0)

## 2021-05-21 LAB — BASIC METABOLIC PANEL
BUN: 10 mg/dL (ref 6–23)
CO2: 27 mEq/L (ref 19–32)
Calcium: 9.4 mg/dL (ref 8.4–10.5)
Chloride: 106 mEq/L (ref 96–112)
Creatinine, Ser: 0.91 mg/dL (ref 0.40–1.20)
GFR: 80.91 mL/min (ref >60.00)
Glucose, Bld: 79 mg/dL (ref 70–99)
Potassium: 3.5 mEq/L (ref 3.5–5.1)
Sodium: 142 mEq/L (ref 135–145)

## 2021-05-21 LAB — HEPATIC FUNCTION PANEL
ALT: 10 U/L (ref 0–35)
AST: 11 U/L (ref 0–37)
Albumin: 4.5 g/dL (ref 3.5–5.2)
Alkaline Phosphatase: 86 U/L (ref 39–117)
Bilirubin, Direct: 0.1 mg/dL (ref 0.0–0.3)
Total Bilirubin: 0.5 mg/dL (ref 0.2–1.2)
Total Protein: 7 g/dL (ref 6.0–8.3)

## 2021-05-21 LAB — VITAMIN D 25 HYDROXY (VIT D DEFICIENCY, FRACTURES): VITD: 37.3 ng/mL (ref 30.00–100.00)

## 2021-05-21 LAB — TSH: TSH: 1.25 u[IU]/mL (ref 0.35–5.50)

## 2021-05-21 MED ORDER — CICLOPIROX 8 % EX SOLN
Freq: Every day | CUTANEOUS | 0 refills | Status: DC
  Filled 2021-05-21: qty 6.6, fill #0

## 2021-05-21 MED ORDER — ALBUTEROL SULFATE HFA 108 (90 BASE) MCG/ACT IN AERS
2.0000 | INHALATION_SPRAY | Freq: Four times a day (QID) | RESPIRATORY_TRACT | 6 refills | Status: DC | PRN
  Filled 2021-05-21: qty 18, 25d supply, fill #0
  Filled 2021-06-17: qty 18, 25d supply, fill #1
  Filled 2021-07-19: qty 18, 25d supply, fill #2
  Filled 2021-09-15: qty 18, 25d supply, fill #3
  Filled 2021-10-25: qty 18, 25d supply, fill #4
  Filled 2022-01-04: qty 18, 25d supply, fill #5
  Filled 2022-04-20: qty 18, 25d supply, fill #6

## 2021-05-21 NOTE — Patient Instructions (Addendum)
Follow up in 1 year or as needed ?We'll notify you of your lab results and make any changes if needed ?Keep up the good work on healthy diet and regular exercise- you're doing great!!! ?Call with any questions or concerns ?Stay Safe!  Stay Healthy! ?Happy Early Birthday!!! ?

## 2021-05-21 NOTE — Assessment & Plan Note (Signed)
Encouraged healthy diet and regular exercise.  Check labs to risk stratify.  Will follow. ?

## 2021-05-21 NOTE — Progress Notes (Signed)
? ?  Subjective:  ? ? Patient ID: Michele Mcbride, female    DOB: 1985-09-22, 36 y.o.   MRN: [id number] ? ?HPI ?CPE- UTD on pap (next scheduled for June), mammo ? ?Patient Care Team  ?  Relationship Specialty Notifications Start End  ?Midge Minium, MD PCP - General Family Medicine  03/29/13   ? Comment: Merged Network engineer)  ?Lahoma Crocker, MD Consulting Physician Obstetrics and Gynecology  04/29/16   ?Magrinat, Virgie Dad, MD (Inactive) Consulting Physician Oncology  01/14/19   ?Harold Hedge, Darrick Grinder, MD Consulting Physician Allergy and Immunology  01/14/19   ?Melvenia Beam, MD Consulting Physician Neurology  01/14/19   ?Lahoma Crocker, MD Consulting Physician Obstetrics and Gynecology  01/14/19   ?Faith Rogue T Counselor Licensed Clinical Social Worker  01/14/19   ?  ? ?Health Maintenance  ?Topic Date Due  ? COVID-19 Vaccine (1) Never done  ? URINE MICROALBUMIN  Never done  ? TETANUS/TDAP  12/18/2021 (Originally 12/15/2020)  ? PAP SMEAR-Modifier  10/13/2021  ? INFLUENZA VACCINE  Completed  ? Hepatitis C Screening  Completed  ? HIV Screening  Completed  ? HPV VACCINES  Aged Out  ?  ? ? ?Review of Systems ?Patient reports no vision/ hearing changes, adenopathy,fever, weight change,  persistant/recurrent hoarseness , swallowing issues, chest pain, palpitations, edema, persistant/recurrent cough, hemoptysis, dyspnea (rest/exertional/paroxysmal nocturnal), gastrointestinal bleeding (melena, rectal bleeding), abdominal pain, significant heartburn, bowel changes, GU symptoms (dysuria, hematuria, incontinence), Gyn symptoms (abnormal  bleeding, pain),  syncope, focal weakness, memory loss, numbness & tingling, skin/hair changes, abnormal bruising or bleeding, anxiety, or depression.  ? ?+ fungal toenail infxn- no relief w/ OTC medications. ? ?This visit occurred during the SARS-CoV-2 public health emergency.  Safety protocols were in place, including screening questions prior to the visit, additional usage of staff  PPE, and extensive cleaning of exam room while observing appropriate contact time as indicated for disinfecting solutions.   ?   ?Objective:  ? Physical Exam ?General Appearance:    Alert, cooperative, no distress, appears stated age  ?Head:    Normocephalic, without obvious abnormality, atraumatic  ?Eyes:    PERRL, conjunctiva/corneas clear, EOM's intact, fundi  ?  benign, both eyes  ?Ears:    Normal TM's and external ear canals, both ears  ?Nose:   Deferred due to COVID  ?Throat:   ?Neck:   Supple, symmetrical, trachea midline, no adenopathy;  ?  Thyroid: no enlargement/tenderness/nodules  ?Back:     Symmetric, no curvature, ROM normal, no CVA tenderness  ?Lungs:     Clear to auscultation bilaterally, respirations unlabored  ?Chest Wall:    No tenderness or deformity  ? Heart:    Regular rate and rhythm, S1 and S2 normal, no murmur, rub ?  or gallop  ?Breast Exam:    Deferred to GYN  ?Abdomen:     Soft, non-tender, bowel sounds active all four quadrants,  ?  no masses, no organomegaly  ?Genitalia:    Deferred to GYN  ?Rectal:    ?Extremities:   Extremities normal, atraumatic, no cyanosis or edema  ?Pulses:   2+ and symmetric all extremities  ?Skin:   Skin color, texture, turgor normal, no rashes or lesions  ?Lymph nodes:   Cervical, supraclavicular, and axillary nodes normal  ?Neurologic:   CNII-XII intact, normal strength, sensation and reflexes  ?  throughout  ?  ? ? ? ?   ?Assessment & Plan:  ? ? ?

## 2021-05-21 NOTE — Assessment & Plan Note (Signed)
Pt's PE WNL w/ exception of BMI.  UTD on pap, mammo.  Check labs.  Anticipatory guidance provided.  ?

## 2021-05-23 ENCOUNTER — Encounter: Payer: Self-pay | Admitting: Family Medicine

## 2021-05-24 ENCOUNTER — Other Ambulatory Visit (HOSPITAL_COMMUNITY): Payer: Self-pay

## 2021-05-24 MED ORDER — TERBINAFINE HCL 250 MG PO TABS
250.0000 mg | ORAL_TABLET | Freq: Every day | ORAL | 0 refills | Status: DC
  Filled 2021-05-24: qty 30, 30d supply, fill #0
  Filled 2021-06-17: qty 30, 30d supply, fill #1
  Filled 2021-07-19: qty 30, 30d supply, fill #2

## 2021-06-09 ENCOUNTER — Other Ambulatory Visit (HOSPITAL_COMMUNITY): Payer: Self-pay

## 2021-06-09 MED ORDER — ORAMAGICRX MT SUSR
OROMUCOSAL | 0 refills | Status: DC
  Filled 2021-06-09: qty 315, 15d supply, fill #0

## 2021-06-09 MED ORDER — DEXAMETHASONE 0.5 MG/5ML PO SOLN
ORAL | 0 refills | Status: DC
  Filled 2021-06-09: qty 300, 10d supply, fill #0

## 2021-06-17 ENCOUNTER — Other Ambulatory Visit: Payer: Self-pay | Admitting: Registered Nurse

## 2021-06-17 NOTE — Telephone Encounter (Signed)
Would defer to Dr. Birdie Riddle - not medically urgent, and I do not see the problem for which she has taken this listed in her current problem list. I would prefer to defer ? ?Thanks, ? ?Rich

## 2021-06-18 ENCOUNTER — Other Ambulatory Visit (HOSPITAL_COMMUNITY): Payer: Self-pay

## 2021-06-21 ENCOUNTER — Other Ambulatory Visit (HOSPITAL_COMMUNITY): Payer: Self-pay

## 2021-06-21 MED ORDER — SPIRONOLACTONE 100 MG PO TABS
100.0000 mg | ORAL_TABLET | Freq: Every day | ORAL | 3 refills | Status: DC
Start: 2021-06-21 — End: 2021-10-13
  Filled 2021-06-21: qty 30, 30d supply, fill #0
  Filled 2021-07-19: qty 30, 30d supply, fill #1
  Filled 2021-08-18: qty 30, 30d supply, fill #2
  Filled 2021-09-15: qty 30, 30d supply, fill #3

## 2021-06-21 NOTE — Addendum Note (Signed)
Addended by: Midge Minium on: 06/21/2021 07:57 AM ? ? Modules accepted: Orders ? ?

## 2021-06-23 ENCOUNTER — Other Ambulatory Visit (HOSPITAL_COMMUNITY): Payer: Self-pay

## 2021-06-23 MED ORDER — PHENTERMINE HCL 37.5 MG PO CAPS
37.5000 mg | ORAL_CAPSULE | ORAL | 0 refills | Status: DC
  Filled 2021-06-23: qty 90, 90d supply, fill #0

## 2021-06-23 NOTE — Addendum Note (Signed)
Addended by: Midge Minium on: 06/23/2021 03:47 PM ? ? Modules accepted: Orders ? ?

## 2021-06-24 ENCOUNTER — Other Ambulatory Visit (HOSPITAL_COMMUNITY): Payer: Self-pay

## 2021-07-11 ENCOUNTER — Telehealth: Payer: 59 | Admitting: Nurse Practitioner

## 2021-07-11 DIAGNOSIS — B3731 Acute candidiasis of vulva and vagina: Secondary | ICD-10-CM | POA: Diagnosis not present

## 2021-07-11 MED ORDER — FLUCONAZOLE 150 MG PO TABS: 150.0000 mg | ORAL_TABLET | Freq: Once | ORAL | 0 refills | Status: AC

## 2021-07-11 NOTE — Progress Notes (Signed)

## 2021-07-11 NOTE — Progress Notes (Signed)
I have spent 5 minutes in review of e-visit questionnaire, review and updating patient chart, medical decision making and response to patient.  ° °Shauntell Iglesia W Ashari Llewellyn, NP ° °  °

## 2021-07-16 ENCOUNTER — Telehealth: Payer: Medicaid Other | Admitting: Family Medicine

## 2021-07-16 ENCOUNTER — Other Ambulatory Visit (HOSPITAL_COMMUNITY): Payer: Self-pay

## 2021-07-16 DIAGNOSIS — B9689 Other specified bacterial agents as the cause of diseases classified elsewhere: Secondary | ICD-10-CM

## 2021-07-16 MED ORDER — METRONIDAZOLE 500 MG PO TABS
500.0000 mg | ORAL_TABLET | Freq: Two times a day (BID) | ORAL | 0 refills | Status: AC
  Filled 2021-07-16: qty 14, 7d supply, fill #0

## 2021-07-16 NOTE — Progress Notes (Signed)
E-Visit for Vaginal Symptoms ? ?We are sorry that you are not feeling well. Here is how we plan to help! ?Based on what you shared with me it looks like you: May have a vaginosis due to bacteria ? ?Vaginosis is an inflammation of the vagina that can result in discharge, itching and pain. The cause is usually a change in the normal balance of vaginal bacteria or an infection. Vaginosis can also result from reduced estrogen levels after menopause. ? ?The most common causes of vaginosis are: ? ? Bacterial vaginosis which results from an overgrowth of one on several organisms that are normally present in your vagina. ? ? Yeast infections which are caused by a naturally occurring fungus called candida. ? ? Vaginal atrophy (atrophic vaginosis) which results from the thinning of the vagina from reduced estrogen levels after menopause. ? ? Trichomoniasis which is caused by a parasite and is commonly transmitted by sexual intercourse. ? ?Factors that increase your risk of developing vaginosis include: ?Medications, such as antibiotics and steroids ?Uncontrolled diabetes ?Use of hygiene products such as bubble bath, vaginal spray or vaginal deodorant ?Douching ?Wearing damp or tight-fitting clothing ?Using an intrauterine device (IUD) for birth control ?Hormonal changes, such as those associated with pregnancy, birth control pills or menopause ?Sexual activity ?Having a sexually transmitted infection ? ?Your treatment plan is Metronidazole or Flagyl 500mg twice a day for 7 days.  I have electronically sent this prescription into the pharmacy that you have chosen. ? ?Be sure to take all of the medication as directed. Stop taking any medication if you develop a rash, tongue swelling or shortness of breath. Mothers who are breast feeding should consider pumping and discarding their breast milk while on these antibiotics. However, there is no consensus that infant exposure at these doses would be harmful.  ?Remember that  medication creams can weaken latex condoms. ?. ? ? ?HOME CARE: ? ?Good hygiene may prevent some types of vaginosis from recurring and may relieve some symptoms: ? ?Avoid baths, hot tubs and whirlpool spas. Rinse soap from your outer genital area after a shower, and dry the area well to prevent irritation. Don't use scented or harsh soaps, such as those with deodorant or antibacterial action. ?Avoid irritants. These include scented tampons and pads. ?Wipe from front to back after using the toilet. Doing so avoids spreading fecal bacteria to your vagina. ? ?Other things that may help prevent vaginosis include: ? ?Don't douche. Your vagina doesn't require cleansing other than normal bathing. Repetitive douching disrupts the normal organisms that reside in the vagina and can actually increase your risk of vaginal infection. Douching won't clear up a vaginal infection. ?Use a latex condom. Both female and female latex condoms may help you avoid infections spread by sexual contact. ?Wear cotton underwear. Also wear pantyhose with a cotton crotch. If you feel comfortable without it, skip wearing underwear to bed. Yeast thrives in moist environments ?Your symptoms should improve in the next day or two. ? ?GET HELP RIGHT AWAY IF: ? ?You have pain in your lower abdomen ( pelvic area or over your ovaries) ?You develop nausea or vomiting ?You develop a fever ?Your discharge changes or worsens ?You have persistent pain with intercourse ?You develop shortness of breath, a rapid pulse, or you faint. ? ?These symptoms could be signs of problems or infections that need to be evaluated by a medical provider now. ? ?MAKE SURE YOU  ? ?Understand these instructions. ?Will watch your condition. ?Will get help right   away if you are not doing well or get worse.  Thank you for choosing an e-visit.  Your e-visit answers were reviewed by a board certified advanced clinical practitioner to complete your personal care plan. Depending upon the  condition, your plan could have included both over the counter or prescription medications.  Please review your pharmacy choice. Make sure the pharmacy is open so you can pick up prescription now. If there is a problem, you may contact your provider through MyChart messaging and have the prescription routed to another pharmacy.  Your safety is important to us. If you have drug allergies check your prescription carefully.   For the next 24 hours you can use MyChart to ask questions about today's visit, request a non-urgent call back, or ask for a work or school excuse. You will get an email in the next two days asking about your experience. I hope that your e-visit has been valuable and will speed your recovery.   I have provided 5 minutes of non face to face time during this encounter for chart review and documentation.   

## 2021-07-19 ENCOUNTER — Other Ambulatory Visit (HOSPITAL_COMMUNITY): Payer: Self-pay

## 2021-07-24 ENCOUNTER — Other Ambulatory Visit (HOSPITAL_COMMUNITY): Payer: Self-pay

## 2021-08-03 ENCOUNTER — Encounter: Payer: Self-pay | Admitting: Family Medicine

## 2021-08-03 ENCOUNTER — Ambulatory Visit (INDEPENDENT_AMBULATORY_CARE_PROVIDER_SITE_OTHER): Payer: 59 | Admitting: Family Medicine

## 2021-08-03 VITALS — BP 118/62 | HR 112 | Temp 97.8°F | Resp 16 | Ht 61.5 in | Wt 161.2 lb

## 2021-08-03 DIAGNOSIS — E669 Obesity, unspecified: Secondary | ICD-10-CM

## 2021-08-03 DIAGNOSIS — E785 Hyperlipidemia, unspecified: Secondary | ICD-10-CM

## 2021-08-03 LAB — BASIC METABOLIC PANEL
BUN: 13 mg/dL (ref 6–23)
CO2: 28 mEq/L (ref 19–32)
Calcium: 9.4 mg/dL (ref 8.4–10.5)
Chloride: 103 mEq/L (ref 96–112)
Creatinine, Ser: 0.97 mg/dL (ref 0.40–1.20)
GFR: 74.84 mL/min (ref >60.00)
Glucose, Bld: 103 mg/dL — ABNORMAL HIGH (ref 70–99)
Potassium: 3.9 mEq/L (ref 3.5–5.1)
Sodium: 139 mEq/L (ref 135–145)

## 2021-08-03 LAB — HEPATIC FUNCTION PANEL
ALT: 14 U/L (ref 0–35)
AST: 13 U/L (ref 0–37)
Albumin: 4.3 g/dL (ref 3.5–5.2)
Alkaline Phosphatase: 80 U/L (ref 39–117)
Bilirubin, Direct: 0.1 mg/dL (ref 0.0–0.3)
Total Bilirubin: 0.5 mg/dL (ref 0.2–1.2)
Total Protein: 6.9 g/dL (ref 6.0–8.3)

## 2021-08-03 LAB — LIPID PANEL
Cholesterol: 230 mg/dL — ABNORMAL HIGH (ref 0–200)
HDL: 47.1 mg/dL (ref >39.00)
LDL Cholesterol: 163 mg/dL — ABNORMAL HIGH (ref 0–99)
NonHDL: 183.22
Total CHOL/HDL Ratio: 5
Triglycerides: 102 mg/dL (ref 0.0–149.0)
VLDL: 20.4 mg/dL (ref 0.0–40.0)

## 2021-08-03 NOTE — Assessment & Plan Note (Signed)
Noted at last visit.  LDL 152.  Pt has been taking OTC Red Yeast Rice since March w/o difficulty.  Check labs.  Will start statin prn.

## 2021-08-03 NOTE — Progress Notes (Signed)
   Subjective:    Patient ID: Michele Mcbride, female    DOB: Jan 12, 1986, 36 y.o.   MRN: [id number]  HPI Obesity- pt is down 4 lbs since last visit.  BMI now 29.97!  Pt feels phentermine has been helpful.  Pt has had intermittent palpitations on phentermine but has not had anything that persisted or she found concerning.  No change in sleep.    Hyperlipidemia- pt's last LDL 152.  Pt has been taking OTC Red Yeast Rice.  No CP, SOB, abd pain, N/V.   Review of Systems For ROS see HPI     Objective:   Physical Exam Vitals reviewed.  Constitutional:      General: She is not in acute distress.    Appearance: Normal appearance. She is well-developed. She is not ill-appearing.  HENT:     Head: Normocephalic and atraumatic.  Eyes:     Conjunctiva/sclera: Conjunctivae normal.     Pupils: Pupils are equal, round, and reactive to light.  Neck:     Thyroid: No thyromegaly.  Cardiovascular:     Rate and Rhythm: Normal rate and regular rhythm.     Pulses: Normal pulses.     Heart sounds: Normal heart sounds. No murmur heard. Pulmonary:     Effort: Pulmonary effort is normal. No respiratory distress.     Breath sounds: Normal breath sounds.  Abdominal:     General: There is no distension.     Palpations: Abdomen is soft.     Tenderness: There is no abdominal tenderness.  Musculoskeletal:     Cervical back: Normal range of motion and neck supple.     Right lower leg: No edema.     Left lower leg: No edema.  Lymphadenopathy:     Cervical: No cervical adenopathy.  Skin:    General: Skin is warm and dry.  Neurological:     Mental Status: She is alert and oriented to person, place, and time.  Psychiatric:        Behavior: Behavior normal.          Assessment & Plan:

## 2021-08-03 NOTE — Assessment & Plan Note (Signed)
Pt is no longer in the obese range!!  BMI is now 29.97!  Applauded her efforts at low carb diet and regular exercise.  She is tolerating Phentermine w/o difficulty.  Check labs.  Adjust meds prn

## 2021-08-03 NOTE — Patient Instructions (Signed)
Schedule your complete physical for March We'll notify you of your lab results and make any changes if needed Continue to work on healthy diet and regular exercise- you're doing great! Call with any questions or concerns Stay Safe!  Stay Healthy! Have a great summer!!!

## 2021-08-04 ENCOUNTER — Other Ambulatory Visit (HOSPITAL_COMMUNITY): Payer: Self-pay

## 2021-08-04 ENCOUNTER — Encounter: Payer: Self-pay | Admitting: Family Medicine

## 2021-08-04 MED ORDER — ROSUVASTATIN CALCIUM 10 MG PO TABS
10.0000 mg | ORAL_TABLET | Freq: Every day | ORAL | 3 refills | Status: DC
  Filled 2021-08-04: qty 30, 30d supply, fill #0
  Filled 2021-08-27: qty 30, 30d supply, fill #1
  Filled 2021-09-28: qty 30, 30d supply, fill #2
  Filled 2021-10-28: qty 30, 30d supply, fill #3

## 2021-08-04 NOTE — Progress Notes (Signed)
Patient reached out via my chart

## 2021-08-05 NOTE — Telephone Encounter (Signed)
Pt asking if she should continue red yeast rice. Okay to continue?

## 2021-08-18 ENCOUNTER — Other Ambulatory Visit (HOSPITAL_COMMUNITY): Payer: Self-pay

## 2021-08-18 ENCOUNTER — Other Ambulatory Visit: Payer: Self-pay | Admitting: Family Medicine

## 2021-08-18 MED ORDER — TERBINAFINE HCL 250 MG PO TABS
250.0000 mg | ORAL_TABLET | Freq: Every day | ORAL | 0 refills | Status: DC
  Filled 2021-08-18: qty 30, 30d supply, fill #0
  Filled 2021-09-15: qty 30, 30d supply, fill #1
  Filled 2021-10-13: qty 30, 30d supply, fill #2

## 2021-08-23 ENCOUNTER — Other Ambulatory Visit (HOSPITAL_COMMUNITY): Payer: Self-pay

## 2021-08-23 DIAGNOSIS — Z1509 Genetic susceptibility to other malignant neoplasm: Secondary | ICD-10-CM | POA: Diagnosis not present

## 2021-08-23 DIAGNOSIS — Z8742 Personal history of other diseases of the female genital tract: Secondary | ICD-10-CM | POA: Diagnosis not present

## 2021-08-23 DIAGNOSIS — Z1501 Genetic susceptibility to malignant neoplasm of breast: Secondary | ICD-10-CM | POA: Diagnosis not present

## 2021-08-23 DIAGNOSIS — Z1502 Genetic susceptibility to malignant neoplasm of ovary: Secondary | ICD-10-CM | POA: Diagnosis not present

## 2021-08-23 DIAGNOSIS — R87810 Cervical high risk human papillomavirus (HPV) DNA test positive: Secondary | ICD-10-CM | POA: Diagnosis not present

## 2021-08-23 DIAGNOSIS — Z01419 Encounter for gynecological examination (general) (routine) without abnormal findings: Secondary | ICD-10-CM | POA: Diagnosis not present

## 2021-08-23 LAB — HM PAP SMEAR: HM Pap smear: POSITIVE

## 2021-08-23 MED ORDER — NORETHINDRONE 0.35 MG PO TABS
1.0000 | ORAL_TABLET | Freq: Every day | ORAL | 11 refills | Status: DC
  Filled 2021-08-23 – 2021-08-31 (×2): qty 28, 28d supply, fill #0
  Filled 2021-09-28: qty 28, 28d supply, fill #1
  Filled 2021-10-28: qty 28, 28d supply, fill #2
  Filled 2021-11-23: qty 28, 28d supply, fill #3
  Filled 2022-01-04: qty 28, 28d supply, fill #4
  Filled 2022-02-17: qty 28, 28d supply, fill #5
  Filled 2022-03-18: qty 28, 28d supply, fill #6
  Filled 2022-04-20: qty 28, 28d supply, fill #7
  Filled 2022-05-20: qty 28, 28d supply, fill #8
  Filled 2022-06-22: qty 28, 28d supply, fill #9
  Filled 2022-07-17: qty 28, 28d supply, fill #10
  Filled 2022-08-12: qty 28, 28d supply, fill #11

## 2021-08-27 ENCOUNTER — Other Ambulatory Visit (HOSPITAL_COMMUNITY): Payer: Self-pay

## 2021-08-31 ENCOUNTER — Other Ambulatory Visit (HOSPITAL_COMMUNITY): Payer: Self-pay

## 2021-09-06 ENCOUNTER — Telehealth: Payer: 59 | Admitting: Neurology

## 2021-09-15 ENCOUNTER — Other Ambulatory Visit (HOSPITAL_COMMUNITY): Payer: Self-pay

## 2021-09-28 ENCOUNTER — Other Ambulatory Visit: Payer: Self-pay | Admitting: Family Medicine

## 2021-09-28 ENCOUNTER — Other Ambulatory Visit (HOSPITAL_COMMUNITY): Payer: Self-pay

## 2021-09-29 ENCOUNTER — Other Ambulatory Visit (HOSPITAL_COMMUNITY): Payer: Self-pay

## 2021-10-01 ENCOUNTER — Other Ambulatory Visit (HOSPITAL_COMMUNITY): Payer: Self-pay

## 2021-10-13 ENCOUNTER — Other Ambulatory Visit: Payer: Self-pay | Admitting: Family Medicine

## 2021-10-14 ENCOUNTER — Other Ambulatory Visit (HOSPITAL_COMMUNITY): Payer: Self-pay

## 2021-10-14 MED ORDER — SPIRONOLACTONE 100 MG PO TABS
100.0000 mg | ORAL_TABLET | Freq: Every day | ORAL | 3 refills | Status: DC
  Filled 2021-10-14: qty 30, 30d supply, fill #0
  Filled 2021-11-23: qty 30, 30d supply, fill #1
  Filled 2021-12-20: qty 30, 30d supply, fill #2
  Filled 2022-01-18: qty 30, 30d supply, fill #3

## 2021-10-14 MED ORDER — PHENTERMINE HCL 37.5 MG PO CAPS
37.5000 mg | ORAL_CAPSULE | ORAL | 0 refills | Status: DC
  Filled 2021-10-14: qty 90, 90d supply, fill #0

## 2021-10-14 NOTE — Telephone Encounter (Signed)
Last visit 5/30 Phentermine - 4/19 - 90 tablets

## 2021-10-14 NOTE — Telephone Encounter (Signed)
Last Visit 08/03/21  Aldactone 06/21/21 - 30 tablets  Phentermine - 06/23/21 - 90 tablets

## 2021-10-15 ENCOUNTER — Telehealth: Payer: Medicaid Other | Admitting: Family Medicine

## 2021-10-15 ENCOUNTER — Other Ambulatory Visit (HOSPITAL_COMMUNITY): Payer: Self-pay

## 2021-10-15 DIAGNOSIS — K121 Other forms of stomatitis: Secondary | ICD-10-CM | POA: Diagnosis not present

## 2021-10-15 MED ORDER — NYSTATIN 100000 UNIT/ML MT SUSP
5.0000 mL | Freq: Four times a day (QID) | OROMUCOSAL | 0 refills | Status: AC | PRN
  Filled 2021-10-15: qty 200, 10d supply, fill #0

## 2021-10-15 NOTE — Progress Notes (Signed)
E-Visit for Mouth Ulcers  We are sorry that you are not feeling well.  Here is how we plan to help!  Based on what you have shared with me, it appears that you do have mouth ulcer(s).     The following medications should decrease the discomfort and help with healing. Multivitamin daily. I am also sending magic mouthwash to take as directed.    Mouth ulcers are painful areas in the mouth and gums. These are also known as "canker sores".  They can occur anywhere inside the mouth. While mostly harmless, mouth ulcers can be extremely uncomfortable and may make it difficult to eat, drink, and brush your teeth.  You may have more than 1 ulcer and they can vary and change in size. Mouth ulcers are not contagious and should not be confused with cold sores.  Cold sores appear on the lip or around the outside of the mouth and often begin with a tingling, burning or itching sensation.   While the exact causes are unknown, some common causes and factors that may aggravate mouth ulcers include: Genetics - Sometimes mouth ulcers run in families High alcohol intake Acidic foods such as citrus fruits like pineapple, grapefruit, orange fruits/juices, may aggravate mouth ulcers Other foods high in acidity or spice such as coffee, chocolate, chips, pretzels, eggs, nuts, cheese Quitting smoking Injury caused by biting the tongue or inside of the cheek Diet lacking in N-82, zinc, folic acid or iron Female hormone shifts with menstruation Excessive fatigue, emotional stress or anxiety Prevention: Talk to your doctor if you are taking meds that are known to cause mouth ulcers such as:   Anti-inflammatory drugs (for example Ibuprofen, Naproxen sodium), pain killers, Beta blockers, Oral nicotine replacement drugs, Some street drugs (heroin).   Avoid allowing any tablets to dissolve in your mouth that are meant to swallowed whole Avoid foods/drinks that trigger or worsen symptoms Keep your mouth clean with daily  brushing and flossing  Home Care: The goal with treatment is to ease the pain where ulcers occur and help them heal as quickly as possible.  There is no medical treatment to prevent mouth ulcers from coming back or recurring.  Avoid spicy and acidic foods Eat soft foods and avoid rough, crunchy foods Avoid chewing gum Do not use toothpaste that contains sodium lauryl sulphite Use a straw to drink which helps avoid liquids toughing the ulcers near the front of your mouth Use a very soft toothbrush If you have dentures or dental hardware that you feel is not fitting well or contributing to his, please see your dentist. Use saltwater mouthwash which helps healing. Dissolve a  teaspoon of salt in a glass of warm water. Swish around your mouth and spit it out. This can be used as needed if it is soothing.   GET HELP RIGHT AWAY IF: Persistent ulcers require checking IN PERSON (face to face). Any mouth lesion lasting longer than a month should be seen by your DENTIST as soon as possible for evaluation for possible oral cancer. If you have a non-painful ulcer in 1 or more areas of your mouth Ulcers that are spreading, are very large or particularly painful Ulcers last longer than one week without improving on treatment If you develop a fever, swollen glands and begin to feel unwell Ulcers that developed after starting a new medication MAKE SURE YOU: Understand these instructions. Will watch your condition. Will get help right away if you are not doing well or get worse.  Thank you for choosing an e-visit.  Your e-visit answers were reviewed by a board certified advanced clinical practitioner to complete your personal care plan. Depending upon the condition, your plan could have included both over the counter or prescription medications.  Please review your pharmacy choice. Make sure the pharmacy is open so you can pick up prescription now. If there is a problem, you may contact your provider  through [employer] and have the prescription routed to another pharmacy.  Your safety is important to [place]. If you have drug allergies check your prescription carefully.   For the next 24 hours you can use MyChart to ask questions about today's visit, request a non-urgent call back, or ask for a work or school excuse. You will get an email in the next two days asking about your experience. I hope that your e-visit has been valuable and will speed your recovery.   I have provided 5 minutes of non face to face time during this encounter for chart review and documentation.

## 2021-10-25 ENCOUNTER — Other Ambulatory Visit (HOSPITAL_COMMUNITY): Payer: Self-pay

## 2021-10-26 ENCOUNTER — Other Ambulatory Visit (HOSPITAL_COMMUNITY): Payer: Self-pay

## 2021-10-28 ENCOUNTER — Other Ambulatory Visit (HOSPITAL_COMMUNITY): Payer: Self-pay

## 2021-11-05 ENCOUNTER — Ambulatory Visit
Admission: RE | Admit: 2021-11-05 | Discharge: 2021-11-05 | Disposition: A | Discharge status: Home / Self Care | Payer: 59 | Source: Ambulatory Visit | Attending: Hematology and Oncology | Admitting: Hematology and Oncology

## 2021-11-05 DIAGNOSIS — N6011 Diffuse cystic mastopathy of right breast: Secondary | ICD-10-CM | POA: Diagnosis not present

## 2021-11-05 DIAGNOSIS — Z1239 Encounter for other screening for malignant neoplasm of breast: Secondary | ICD-10-CM

## 2021-11-05 MED ORDER — GADOPICLENOL 0.5 MMOL/ML IV SOLN
7.0000 mL | Freq: Once | INTRAVENOUS | Status: AC | PRN
  Administered 2021-11-05: 7 mL via INTRAVENOUS

## 2021-11-23 ENCOUNTER — Other Ambulatory Visit (HOSPITAL_COMMUNITY): Payer: Self-pay

## 2021-11-23 ENCOUNTER — Other Ambulatory Visit: Payer: Self-pay | Admitting: Family Medicine

## 2021-11-23 MED ORDER — TERBINAFINE HCL 250 MG PO TABS
250.0000 mg | ORAL_TABLET | Freq: Every day | ORAL | 0 refills | Status: DC
  Filled 2021-11-23: qty 30, 30d supply, fill #0
  Filled 2021-12-20: qty 30, 30d supply, fill #1
  Filled 2022-01-18: qty 30, 30d supply, fill #2

## 2021-11-24 ENCOUNTER — Other Ambulatory Visit (HOSPITAL_COMMUNITY): Payer: Self-pay

## 2021-11-25 ENCOUNTER — Encounter: Payer: Self-pay | Admitting: Neurology

## 2021-11-25 DIAGNOSIS — Z0289 Encounter for other administrative examinations: Secondary | ICD-10-CM

## 2021-11-25 NOTE — Telephone Encounter (Signed)
LVM asking pt to call back for payment of FMLA paperwork

## 2021-11-27 ENCOUNTER — Other Ambulatory Visit (HOSPITAL_COMMUNITY): Payer: Self-pay

## 2021-11-27 ENCOUNTER — Other Ambulatory Visit: Payer: Self-pay | Admitting: Family Medicine

## 2021-11-29 ENCOUNTER — Other Ambulatory Visit: Payer: Self-pay | Admitting: Neurology

## 2021-11-29 ENCOUNTER — Other Ambulatory Visit (HOSPITAL_COMMUNITY): Payer: Self-pay

## 2021-11-29 ENCOUNTER — Telehealth: Payer: Self-pay

## 2021-11-29 MED ORDER — ONDANSETRON 4 MG PO TBDP
4.0000 mg | ORAL_TABLET | Freq: Three times a day (TID) | ORAL | 3 refills | Status: DC | PRN
  Filled 2021-11-29: qty 30, 5d supply, fill #0
  Filled 2022-01-04: qty 30, 5d supply, fill #1
  Filled 2022-02-25: qty 30, 5d supply, fill #2
  Filled 2022-04-20: qty 30, 5d supply, fill #3

## 2021-11-29 MED ORDER — ROSUVASTATIN CALCIUM 10 MG PO TABS
10.0000 mg | ORAL_TABLET | Freq: Every day | ORAL | 3 refills | Status: DC
  Filled 2021-11-29: qty 30, 30d supply, fill #0
  Filled 2022-01-04: qty 30, 30d supply, fill #1
  Filled 2022-02-17: qty 30, 30d supply, fill #2
  Filled 2022-03-18: qty 30, 30d supply, fill #3

## 2021-11-29 NOTE — Telephone Encounter (Signed)
PA sent to plan via CMM. Awaiting determination from [facility].

## 2021-12-01 NOTE — Telephone Encounter (Signed)
FMLA form completed, signed, and sent to medical records for processing.  ?

## 2021-12-02 NOTE — Telephone Encounter (Signed)
I called [employer]. Spoke to [employer]. [phone] pharmacy help desk.  Pt last seen 05-2021.  On ubrelvy for acute migraines.  Not chronic.  Ref # [id number] FAX form back to [id number] auth dept. (put on form not a duplicate).  Form completed and to be signed then faxed back to # listed.

## 2021-12-03 ENCOUNTER — Other Ambulatory Visit (HOSPITAL_COMMUNITY): Payer: Self-pay

## 2021-12-05 ENCOUNTER — Encounter: Payer: Self-pay | Admitting: Neurology

## 2021-12-06 NOTE — Telephone Encounter (Signed)
FMLA paperwork faxed to [employer] on 12/01/2021

## 2021-12-08 NOTE — Telephone Encounter (Signed)
[employer] patient consent form for appeal emailed to pt at [email or web address]

## 2021-12-08 NOTE — Telephone Encounter (Signed)
Received patient consent form from [facility]. This was emailed to the pt, signed, then returned from patient via fax. Form received, then faxed to [facility]. Received a receipt of confirmation.

## 2021-12-08 NOTE — Telephone Encounter (Deleted)
Received from [facility], that [place] '100mg'$  tablets was approved thry 03-07-2023, but denied for tier exception. (As the brand name product does not have a lower cost level product available).  This was relayed to pt via mychart.

## 2021-12-09 ENCOUNTER — Telehealth: Payer: Self-pay | Admitting: Orthopaedic Surgery

## 2021-12-09 ENCOUNTER — Other Ambulatory Visit (HOSPITAL_COMMUNITY): Payer: Self-pay

## 2021-12-09 ENCOUNTER — Other Ambulatory Visit: Payer: Self-pay | Admitting: Physician Assistant

## 2021-12-09 ENCOUNTER — Ambulatory Visit (INDEPENDENT_AMBULATORY_CARE_PROVIDER_SITE_OTHER): Payer: 59 | Admitting: Orthopaedic Surgery

## 2021-12-09 ENCOUNTER — Encounter: Payer: Self-pay | Admitting: Orthopaedic Surgery

## 2021-12-09 ENCOUNTER — Ambulatory Visit (INDEPENDENT_AMBULATORY_CARE_PROVIDER_SITE_OTHER): Payer: 59

## 2021-12-09 DIAGNOSIS — M5440 Lumbago with sciatica, unspecified side: Secondary | ICD-10-CM | POA: Diagnosis not present

## 2021-12-09 DIAGNOSIS — M545 Low back pain, unspecified: Secondary | ICD-10-CM

## 2021-12-09 MED ORDER — METHYLPREDNISOLONE 4 MG PO TBPK
ORAL_TABLET | ORAL | 0 refills | Status: DC
  Filled 2021-12-09: qty 21, 6d supply, fill #0

## 2021-12-09 NOTE — Progress Notes (Signed)
Office Visit Note   Patient: Michele Mcbride           Date of Birth: November 09, 1985           MRN: [id number] Visit Date: 12/09/2021              Requested by: Midge Minium, MD [address] [place] [address] [place],  [place] [address] PCP: Midge Minium, MD   Assessment & Plan: Visit Diagnoses:  1. Acute left-sided low back pain, unspecified whether sciatica present   2. Acute bilateral low back pain with sciatica, sciatica laterality unspecified     Plan: Ms. 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  She presents today with a 5-day history of posterior back pain on the left that radiates to the top of her thigh.  She also has associated burning.  She does not have any weakness and she denies any loss of bowel or bladder control.  She did not have any particular injury but said she had something similar a while back when she went on a long car ride without a break and at that time she said her whole leg went numb.  Her x-ray shows a little facet arthropathy but otherwise no listhesis or degenerative changes.  She has had a Medrol Dosepak in the past and tolerated it fine.  We will call in a Medrol Dosepak for her.  Also have given her some exercises to do for her back though we do not want her to start them until her back is calm down.  If she had continued pain early in the week we could add a muscle relaxant.  She will let us know earlier next week how she is doing  Follow-Up Instructions: Return in 1 week (on 12/16/2021), or if symptoms worsen or fail to improve.   Orders:  Orders Placed This Encounter  Procedures   XR Lumbar Spine 2-3 Views   No orders of the defined types were placed in this encounter.     Procedures: No procedures performed   Clinical Data: No additional findings.   Subjective: Chief Complaint  Patient presents with   Lower Back - Pain  Patient presents today for lower back pain. She said that it started Saturday, with no  known injury. She has pain that radiates into her left thigh. She has tried Flexeril and over the counter meds, with no relief.     Review of Systems  All other systems reviewed and are negative.    Objective: Vital Signs: There were no vitals taken for this visit.  Physical Exam Constitutional:      Appearance: Normal appearance.  Cardiovascular:     Rate and Rhythm: Normal rate.  Pulmonary:     Effort: Pulmonary effort is normal.  Skin:    General: Skin is warm and dry.  Neurological:     General: No focal deficit present.     Mental Status: She is alert.     Ortho Exam Examination of her lower back.  She does have some uncomfortable with sitting in certain positions.  She points to her pain in the posterior lower left back.  Radiates around to the front of her thigh.  Her states sensation is intact and equal bilaterally she is able to move her feet and wiggle her toes.  She has 5 out of 5 strength with resisted dorsiflexion plantarflexion of her ankles extension and flexion of her legs and flexion of  her hips.  She has a negative straight leg raise on the right on the straight leg raise on the left it does reproduce a little bit of the pain on her anterior thigh has a little bit of pain with manipulation of her hip but it is not in her groin it is in her posterior lower back Specialty Comments:  No specialty comments available.  Imaging: XR Lumbar Spine 2-3 Views  Result Date: 12/09/2021 AP and lateral of the lumbar spine were obtained.  There was very minimal i.e. 5 degree or so right long lumbar scoliosis.  No evidence of listhesis.  Disc spaces are well-maintained.  Some facet sclerosis at L4-5 and L5-S1.  No obvious bony lesions    PMFS History: Patient Active Problem List   Diagnosis Date Noted   Low back pain 12/09/2021   Hyperlipidemia 08/03/2021   Lymphadenopathy, submandibular 02/19/2021   Obesity (BMI 30-39.9) 67/34/1937   Metabolic syndrome 90/24/0973    Encounter for induction of labor 05/14/2020   History of gestational diabetes 05/14/2020   Polyhydramnios 05/14/2020   SVD (spontaneous vaginal delivery) 05/14/2020   Postpartum care following vaginal delivery (3/10) 05/14/2020   Breast cancer screening, high risk patient 01/14/2019   At high risk for breast cancer 53/29/9242   Monoallelic mutation of CHEK2 gene in female patient    Physical exam 04/29/2016   Anxiety 07/06/2007   Past Medical History:  Diagnosis Date   Anxiety    Asthma    Family history of breast cancer    Gestational diabetes    metformin   Migraine    Monoallelic mutation of CHEK2 gene in female patient    PCOS (polycystic ovarian syndrome)    Sinus infection     Family History  Problem Relation Age of Onset   Hyperlipidemia Mother    Neuropathy Mother    Migraines Mother    Breast cancer Mother 46   Migraines Other        sisters, aunts, and grandmother had migraines on mother's side   Breast cancer Paternal Aunt     Past Surgical History:  Procedure Laterality Date   BREAST LUMPECTOMY WITH RADIOACTIVE SEED LOCALIZATION Bilateral 06/20/2019   Procedure: BILATERAL BREAST LUMPECTOMY WITH RADIOACTIVE SEED LOCALIZATION, Right x 2 and Left x1;  Surgeon: Erroll Luna, MD;  Location: [facility];  Service: General;  Laterality: Bilateral;   WISDOM TOOTH EXTRACTION     Social History   Occupational History   Not on file  Tobacco Use   Smoking status: Never   Smokeless tobacco: Never  Vaping Use   Vaping Use: Never used  Substance and Sexual Activity   Alcohol use: No   Drug use: No   Sexual activity: Yes

## 2021-12-13 ENCOUNTER — Encounter: Payer: Self-pay | Admitting: Orthopaedic Surgery

## 2021-12-13 ENCOUNTER — Other Ambulatory Visit: Payer: Self-pay | Admitting: Physician Assistant

## 2021-12-13 ENCOUNTER — Other Ambulatory Visit (HOSPITAL_COMMUNITY): Payer: Self-pay

## 2021-12-13 MED ORDER — METHOCARBAMOL 500 MG PO TABS
500.0000 mg | ORAL_TABLET | Freq: Four times a day (QID) | ORAL | 0 refills | Status: DC | PRN
Start: 2021-12-13 — End: 2022-01-24
  Filled 2021-12-13: qty 30, 8d supply, fill #0

## 2021-12-14 NOTE — Telephone Encounter (Signed)
seeMary Anne's note-thanks

## 2021-12-15 ENCOUNTER — Other Ambulatory Visit (HOSPITAL_COMMUNITY): Payer: Self-pay

## 2021-12-15 NOTE — Telephone Encounter (Signed)
Received fax approval from [facility] for [place] 11-30-2021 thru 03-06-2038. #[id number].  PA # [id number], MID [id number] N .  [phone].

## 2021-12-16 ENCOUNTER — Other Ambulatory Visit (HOSPITAL_COMMUNITY): Payer: Self-pay

## 2021-12-16 DIAGNOSIS — D260 Other benign neoplasm of cervix uteri: Secondary | ICD-10-CM | POA: Diagnosis not present

## 2021-12-16 DIAGNOSIS — R8290 Unspecified abnormal findings in urine: Secondary | ICD-10-CM | POA: Diagnosis not present

## 2021-12-16 DIAGNOSIS — R87810 Cervical high risk human papillomavirus (HPV) DNA test positive: Secondary | ICD-10-CM | POA: Diagnosis not present

## 2021-12-21 ENCOUNTER — Other Ambulatory Visit (HOSPITAL_COMMUNITY): Payer: Self-pay

## 2022-01-04 ENCOUNTER — Telehealth: Payer: 59 | Admitting: Physician Assistant

## 2022-01-04 ENCOUNTER — Other Ambulatory Visit (HOSPITAL_COMMUNITY): Payer: Self-pay

## 2022-01-04 DIAGNOSIS — R3989 Other symptoms and signs involving the genitourinary system: Secondary | ICD-10-CM | POA: Diagnosis not present

## 2022-01-04 MED ORDER — CEPHALEXIN 500 MG PO CAPS
500.0000 mg | ORAL_CAPSULE | Freq: Two times a day (BID) | ORAL | 0 refills | Status: AC
  Filled 2022-01-04: qty 14, 7d supply, fill #0

## 2022-01-04 NOTE — Progress Notes (Signed)

## 2022-01-04 NOTE — Progress Notes (Signed)
I have spent 5 minutes in review of e-visit questionnaire, review and updating patient chart, medical decision making and response to patient.   Ariyan Brisendine Cody Christpher Stogsdill, PA-C    

## 2022-01-12 ENCOUNTER — Other Ambulatory Visit: Payer: Self-pay | Admitting: Physician Assistant

## 2022-01-12 ENCOUNTER — Other Ambulatory Visit: Payer: Self-pay | Admitting: Family Medicine

## 2022-01-13 ENCOUNTER — Other Ambulatory Visit: Payer: Self-pay | Admitting: Family Medicine

## 2022-01-13 ENCOUNTER — Encounter: Payer: Self-pay | Admitting: Hematology and Oncology

## 2022-01-13 ENCOUNTER — Other Ambulatory Visit: Payer: Self-pay | Admitting: Physician Assistant

## 2022-01-13 ENCOUNTER — Other Ambulatory Visit (HOSPITAL_COMMUNITY): Payer: Self-pay

## 2022-01-13 ENCOUNTER — Other Ambulatory Visit: Payer: Self-pay | Admitting: *Deleted

## 2022-01-13 MED ORDER — TAMOXIFEN CITRATE 20 MG PO TABS
20.0000 mg | ORAL_TABLET | Freq: Every day | ORAL | 3 refills | Status: DC
  Filled 2022-01-13 – 2022-01-22 (×2): qty 90, 90d supply, fill #0
  Filled 2022-04-20 – 2022-05-17 (×2): qty 90, 90d supply, fill #1
  Filled 2022-08-10: qty 90, 90d supply, fill #2
  Filled 2022-11-17: qty 90, 90d supply, fill #3

## 2022-01-13 NOTE — Telephone Encounter (Signed)
Patient is requesting a refill of the following medications: Requested Prescriptions   Pending Prescriptions Disp Refills   phentermine 37.5 MG capsule 90 capsule 0    Sig: Take 1 capsule (37.5 mg total) by mouth every morning.    Date of patient request: 01/13/22 Last office visit: 08/03/21 Date of last refill: 10/14/21 Last refill amount: 90 Follow up time period per chart: CPE March 2024

## 2022-01-14 ENCOUNTER — Other Ambulatory Visit (HOSPITAL_COMMUNITY): Payer: Self-pay

## 2022-01-18 ENCOUNTER — Other Ambulatory Visit: Payer: Self-pay | Admitting: Family Medicine

## 2022-01-18 ENCOUNTER — Encounter: Payer: Self-pay | Admitting: Family Medicine

## 2022-01-18 NOTE — Telephone Encounter (Signed)
Patient is requesting a refill of the following medications: Requested Prescriptions   Pending Prescriptions Disp Refills   phentermine 37.5 MG capsule 90 capsule 0    Sig: Take 1 capsule (37.5 mg total) by mouth every morning.    Date of patient request: 01/18/22 Last office visit: 08/03/21 Date of last refill: 10/14/21 Last refill amount: 90

## 2022-01-19 ENCOUNTER — Other Ambulatory Visit (HOSPITAL_COMMUNITY): Payer: Self-pay

## 2022-01-21 ENCOUNTER — Other Ambulatory Visit (HOSPITAL_COMMUNITY): Payer: Self-pay

## 2022-01-21 ENCOUNTER — Ambulatory Visit: Payer: Medicaid Other | Admitting: Family Medicine

## 2022-01-22 ENCOUNTER — Other Ambulatory Visit (HOSPITAL_COMMUNITY): Payer: Self-pay

## 2022-01-24 ENCOUNTER — Ambulatory Visit (INDEPENDENT_AMBULATORY_CARE_PROVIDER_SITE_OTHER): Payer: Medicaid Other | Admitting: Family Medicine

## 2022-01-24 ENCOUNTER — Other Ambulatory Visit (HOSPITAL_COMMUNITY): Payer: Self-pay

## 2022-01-24 ENCOUNTER — Encounter: Payer: Self-pay | Admitting: Family Medicine

## 2022-01-24 VITALS — BP 130/82 | HR 104 | Temp 97.9°F | Resp 17 | Ht 61.5 in | Wt 170.4 lb

## 2022-01-24 DIAGNOSIS — M2550 Pain in unspecified joint: Secondary | ICD-10-CM

## 2022-01-24 MED ORDER — MELOXICAM 15 MG PO TABS
15.0000 mg | ORAL_TABLET | Freq: Every day | ORAL | 3 refills | Status: DC
  Filled 2022-01-24: qty 30, 30d supply, fill #0
  Filled 2022-02-17: qty 30, 30d supply, fill #1
  Filled 2022-03-22: qty 30, 30d supply, fill #2
  Filled 2022-04-20: qty 30, 30d supply, fill #3

## 2022-01-24 MED ORDER — DULOXETINE HCL 20 MG PO CPEP
20.0000 mg | ORAL_CAPSULE | Freq: Every day | ORAL | 3 refills | Status: DC
  Filled 2022-01-24: qty 30, 30d supply, fill #0
  Filled 2022-02-17: qty 30, 30d supply, fill #1
  Filled 2022-03-22: qty 30, 30d supply, fill #2
  Filled 2022-04-20: qty 30, 30d supply, fill #3

## 2022-01-24 NOTE — Progress Notes (Signed)
   Subjective:    Patient ID: Michele Mcbride, female    DOB: 26-Apr-1985, 36 y.o.   MRN: [id number]  HPI Joint pain- pt reports sxs started w/ 'a lot of low back pain'.  Saw Ortho.  She was started on Prednisone and Methocarbamol.  Pain returned and seems to be in multiple joints.  She stopped the Rosuvastatin as an experiment and joint pain did not improve.  Pain in wrists, hips, shoulders.  Having issues lifting her youngest daughter into the car seat.  States there are days 'it feels like it is breaking in half' in regards to back pain.  No obvious swelling or redness/warmth.     Review of Systems For ROS see HPI     Objective:   Physical Exam Vitals reviewed.  Constitutional:      General: She is not in acute distress.    Appearance: Normal appearance. She is not ill-appearing.  HENT:     Head: Normocephalic and atraumatic.  Eyes:     Extraocular Movements: Extraocular movements intact.     Conjunctiva/sclera: Conjunctivae normal.     Pupils: Pupils are equal, round, and reactive to light.  Pulmonary:     Effort: Pulmonary effort is normal. No respiratory distress.  Musculoskeletal:        General: No swelling or deformity.     Cervical back: Normal range of motion and neck supple. No rigidity.  Skin:    General: Skin is warm and dry.  Neurological:     General: No focal deficit present.     Mental Status: She is alert and oriented to person, place, and time.  Psychiatric:     Comments: Tearful, anxious           Assessment & Plan:

## 2022-01-24 NOTE — Patient Instructions (Signed)
Follow up as needed or as scheduled We'll notify you of your lab results and make any changes if needed START the Meloxicam once daily- take w/ food You can take Acetaminophen (Tylenol) in addition to the Meloxicam but avoid Ibuprofen, Aleve, etc Alternate ice/heat for pain relief Call with any questions or concerns Hang in there!!!

## 2022-01-25 LAB — CBC WITH DIFFERENTIAL/PLATELET
Basophils Absolute: 0 10*3/uL (ref 0.0–0.1)
Basophils Relative: 0.5 % (ref 0.0–3.0)
Eosinophils Absolute: 0 10*3/uL (ref 0.0–0.7)
Eosinophils Relative: 0.5 % (ref 0.0–5.0)
HCT: 42.8 % (ref 36.0–46.0)
Hemoglobin: 14.5 g/dL (ref 12.0–15.0)
Lymphocytes Relative: 24.9 % (ref 12.0–46.0)
Lymphs Abs: 1.7 10*3/uL (ref 0.7–4.0)
MCHC: 33.7 g/dL (ref 30.0–36.0)
MCV: 93.1 fl (ref 78.0–100.0)
Monocytes Absolute: 0.4 10*3/uL (ref 0.1–1.0)
Monocytes Relative: 5.9 % (ref 3.0–12.0)
Neutro Abs: 4.7 10*3/uL (ref 1.4–7.7)
Neutrophils Relative %: 68.2 % (ref 43.0–77.0)
Platelets: 246 10*3/uL (ref 150.0–400.0)
RBC: 4.6 Mil/uL (ref 3.87–5.11)
RDW: 13.8 % (ref 11.5–15.5)
WBC: 6.8 10*3/uL (ref 4.0–10.5)

## 2022-01-25 LAB — HEPATIC FUNCTION PANEL
ALT: 9 U/L (ref 0–35)
AST: 12 U/L (ref 0–37)
Albumin: 4.3 g/dL (ref 3.5–5.2)
Alkaline Phosphatase: 68 U/L (ref 39–117)
Bilirubin, Direct: 0.1 mg/dL (ref 0.0–0.3)
Total Bilirubin: 0.2 mg/dL (ref 0.2–1.2)
Total Protein: 7.1 g/dL (ref 6.0–8.3)

## 2022-01-25 LAB — BASIC METABOLIC PANEL
BUN: 10 mg/dL (ref 6–23)
CO2: 30 mEq/L (ref 19–32)
Calcium: 9.1 mg/dL (ref 8.4–10.5)
Chloride: 106 mEq/L (ref 96–112)
Creatinine, Ser: 0.92 mg/dL (ref 0.40–1.20)
GFR: 79.48 mL/min (ref >60.00)
Glucose, Bld: 92 mg/dL (ref 70–99)
Potassium: 3.8 mEq/L (ref 3.5–5.1)
Sodium: 144 mEq/L (ref 135–145)

## 2022-01-25 LAB — RHEUMATOID FACTOR: Rheumatoid fact SerPl-aCnc: <14 IU/mL (ref <14)

## 2022-01-25 LAB — ANA: Anti Nuclear Antibody (ANA): NEGATIVE

## 2022-01-25 LAB — TSH: TSH: 1.67 u[IU]/mL (ref 0.35–5.50)

## 2022-01-25 LAB — SEDIMENTATION RATE: Sed Rate: 4 mm/hr (ref 0–20)

## 2022-01-26 ENCOUNTER — Telehealth: Payer: Self-pay

## 2022-01-26 NOTE — Telephone Encounter (Signed)
-----   Message from Midge Minium, MD sent at 01/26/2022  7:28 AM EST ----- ANA is normal.  Thankful this is not a rheumatologic process!

## 2022-01-26 NOTE — Telephone Encounter (Signed)
Left lab results VM

## 2022-01-30 DIAGNOSIS — M2550 Pain in unspecified joint: Secondary | ICD-10-CM | POA: Insufficient documentation

## 2022-01-30 NOTE — Assessment & Plan Note (Signed)
New.  Pt reports sxs started w/ low back pain but then progressed to multiple joints and seems to be bilateral.  At this time, she is unable to lift her youngest into her car seat due to pain.  Will start daily Meloxicam to try and get ahead of pain and inflammation.  Will check labs to r/o rheumatologic process.  Had long discussion about fibromyalgia given pt's tearfulness in exam room today.  Will start Cymbalta to see if either mood or pain improves.  Pt expressed understanding and is in agreement w/ plan.

## 2022-02-17 ENCOUNTER — Other Ambulatory Visit: Payer: Self-pay

## 2022-02-17 ENCOUNTER — Other Ambulatory Visit (HOSPITAL_COMMUNITY): Payer: Self-pay

## 2022-02-17 ENCOUNTER — Other Ambulatory Visit: Payer: Self-pay | Admitting: Family Medicine

## 2022-02-17 MED ORDER — SPIRONOLACTONE 100 MG PO TABS
100.0000 mg | ORAL_TABLET | Freq: Every day | ORAL | 3 refills | Status: DC
  Filled 2022-02-17: qty 30, 30d supply, fill #0
  Filled 2022-03-18: qty 30, 30d supply, fill #1
  Filled 2022-04-20: qty 30, 30d supply, fill #2
  Filled 2022-05-20: qty 30, 30d supply, fill #3

## 2022-02-18 ENCOUNTER — Telehealth: Payer: Medicaid Other | Admitting: Physician Assistant

## 2022-02-18 DIAGNOSIS — H6690 Otitis media, unspecified, unspecified ear: Secondary | ICD-10-CM | POA: Diagnosis not present

## 2022-02-18 MED ORDER — AMOXICILLIN-POT CLAVULANATE 875-125 MG PO TABS
1.0000 | ORAL_TABLET | Freq: Two times a day (BID) | ORAL | 0 refills | Status: DC
  Filled 2022-02-18: qty 20, 10d supply, fill #0

## 2022-02-18 NOTE — Progress Notes (Signed)

## 2022-02-19 ENCOUNTER — Other Ambulatory Visit (HOSPITAL_COMMUNITY): Payer: Self-pay

## 2022-02-24 ENCOUNTER — Other Ambulatory Visit (HOSPITAL_COMMUNITY): Payer: Self-pay

## 2022-02-24 ENCOUNTER — Encounter: Payer: Self-pay | Admitting: Family Medicine

## 2022-02-24 MED ORDER — METHOCARBAMOL 750 MG PO TABS
750.0000 mg | ORAL_TABLET | Freq: Three times a day (TID) | ORAL | 1 refills | Status: DC | PRN
  Filled 2022-02-24: qty 45, 15d supply, fill #0
  Filled 2022-06-09: qty 45, 15d supply, fill #1

## 2022-02-25 ENCOUNTER — Other Ambulatory Visit (HOSPITAL_COMMUNITY): Payer: Self-pay

## 2022-03-01 ENCOUNTER — Other Ambulatory Visit (HOSPITAL_COMMUNITY): Payer: Self-pay

## 2022-03-10 ENCOUNTER — Encounter (INDEPENDENT_AMBULATORY_CARE_PROVIDER_SITE_OTHER): Payer: Medicaid Other | Admitting: Family Medicine

## 2022-03-10 DIAGNOSIS — E669 Obesity, unspecified: Secondary | ICD-10-CM

## 2022-03-14 DIAGNOSIS — E669 Obesity, unspecified: Secondary | ICD-10-CM | POA: Diagnosis not present

## 2022-03-17 ENCOUNTER — Encounter: Payer: Self-pay | Admitting: Hematology and Oncology

## 2022-03-17 ENCOUNTER — Other Ambulatory Visit: Payer: Self-pay | Admitting: *Deleted

## 2022-03-17 DIAGNOSIS — Z1239 Encounter for other screening for malignant neoplasm of breast: Secondary | ICD-10-CM

## 2022-03-19 ENCOUNTER — Other Ambulatory Visit: Payer: Self-pay

## 2022-03-22 ENCOUNTER — Other Ambulatory Visit (HOSPITAL_COMMUNITY): Payer: Self-pay

## 2022-03-31 ENCOUNTER — Other Ambulatory Visit (HOSPITAL_COMMUNITY): Payer: Self-pay

## 2022-03-31 MED ORDER — QSYMIA 3.75-23 MG PO CP24
ORAL_CAPSULE | ORAL | 0 refills | Status: DC
  Filled 2022-03-31 – 2022-04-08 (×3): qty 42, 28d supply, fill #0

## 2022-03-31 MED ORDER — QSYMIA 7.5-46 MG PO CP24
1.0000 | ORAL_CAPSULE | Freq: Every day | ORAL | 1 refills | Status: DC
  Filled 2022-03-31 – 2022-04-01 (×2): qty 30, 30d supply, fill #0

## 2022-03-31 NOTE — Telephone Encounter (Signed)
[facility] VISIT   Patient agreed to [facility] visit and is aware that copayment and coinsurance may apply. Patient was treated using telemedicine according to accepted telemedicine protocols.  Subjective:   Patient complains of obesity  Patient Active Problem List   Diagnosis Date Noted   Polyarthralgia 01/30/2022   Low back pain 12/09/2021   Hyperlipidemia 08/03/2021   Lymphadenopathy, submandibular 02/19/2021   Obesity (BMI 30-39.9) 79/04/4095   Metabolic syndrome 35/32/9924   Encounter for induction of labor 05/14/2020   History of gestational diabetes 05/14/2020   Polyhydramnios 05/14/2020   SVD (spontaneous vaginal delivery) 05/14/2020   Postpartum care following vaginal delivery (3/10) 05/14/2020   Breast cancer screening, high risk patient 01/14/2019   At high risk for breast cancer 26/83/4196   Monoallelic mutation of CHEK2 gene in female patient    Physical exam 04/29/2016   Anxiety 07/06/2007   Social History   Tobacco Use   Smoking status: Never   Smokeless tobacco: Never  Substance Use Topics   Alcohol use: No    Current Outpatient Medications:    Phentermine-Topiramate (QSYMIA) 3.75-23 MG CP24, Take 1 capsule by mouth daily for 14 days, THEN 2 capsules daily for 14 days., Disp: 42 capsule, Rfl: 0   Phentermine-Topiramate (QSYMIA) 7.5-46 MG CP24, Take 1 capsule by mouth daily., Disp: 30 capsule, Rfl: 1   albuterol (VENTOLIN HFA) 108 (90 Base) MCG/ACT inhaler, Inhale 2 puffs into the lungs every 6 hours as needed for wheezing or shortness of breath., Disp: 18 g, Rfl: 6   ALPRAZolam (XANAX) 0.5 MG tablet, Take 1 tablet (0.5 mg total) by mouth 2 (two) times daily as needed for anxiety., Disp: 30 tablet, Rfl: 1   amoxicillin-clavulanate (AUGMENTIN) 875-125 MG tablet, Take 1 tablet by mouth 2 (two) times daily., Disp: 20 tablet, Rfl: 0   Biotin 10 MG CAPS, Take 10 mg by mouth daily., Disp: , Rfl:    Cholecalciferol (VITAMIN D3) 25 MCG (1000 UT) CAPS, Take 25 capsules by  mouth daily., Disp: , Rfl:    DULoxetine (CYMBALTA) 20 MG capsule, Take 1 capsule (20 mg total) by mouth daily., Disp: 30 capsule, Rfl: 3   meloxicam (MOBIC) 15 MG tablet, Take 1 tablet (15 mg total) by mouth daily., Disp: 30 tablet, Rfl: 3   methocarbamol (ROBAXIN) 750 MG tablet, Take 1 tablet (750 mg total) by mouth every 8 (eight) hours as needed for muscle spasms., Disp: 45 tablet, Rfl: 1   norethindrone (MICRONOR) 0.35 MG tablet, Take 1 tablet by mouth daily., Disp: 30 tablet, Rfl: 11   ondansetron (ZOFRAN-ODT) 4 MG disintegrating tablet, Dissolve 1-2 tablets (4-8 mg total) by mouth every 8 (eight) hours as needed for nausea., Disp: 30 tablet, Rfl: 3   rosuvastatin (CRESTOR) 10 MG tablet, Take 1 tablet (10 mg total) by mouth daily., Disp: 30 tablet, Rfl: 3   spironolactone (ALDACTONE) 100 MG tablet, Take 1 tablet (100 mg total) by mouth daily., Disp: 30 tablet, Rfl: 3   tamoxifen (NOLVADEX) 20 MG tablet, Take 1 tablet (20 mg total) by mouth daily., Disp: 90 tablet, Rfl: 3   terbinafine (LAMISIL) 250 MG tablet, Take 1 tablet (250 mg total) by mouth daily., Disp: 90 tablet, Rfl: 0   Ubrogepant (UBRELVY) 100 MG TABS, Take 1 tablet by mouth every 2 (two) hours as needed. Maximum 2 tabs/day., Disp: 16 tablet, Rfl: 11   VITAMIN D PO, Take 2,000 Units by mouth daily. OTC, Disp: , Rfl:   Allergies  Allergen Reactions   Strawberry Extract  Anaphylaxis   Latex Rash    Rash and sores    Assessment and Plan:   Diagnosis: Obesity. Please see myChart communication and orders below.   No orders of the defined types were placed in this encounter.  Meds ordered this encounter  Medications   Phentermine-Topiramate (QSYMIA) 3.75-23 MG CP24    Sig: Take 1 capsule by mouth daily for 14 days, THEN 2 capsules daily for 14 days.    Dispense:  42 capsule    Refill:  0   Phentermine-Topiramate (QSYMIA) 7.5-46 MG CP24    Sig: Take 1 capsule by mouth daily.    Dispense:  30 capsule    Refill:  1     Annye Asa, MD 03/31/2022  A total of 12 minutes were spent by me to personally review the patient-generated inquiry, review patient records and data pertinent to assessment of the patient's problem, develop a management plan including generation of prescriptions and/or orders, and on subsequent communication with the patient through secure the MyChart portal service.   There is no separately reported E/M service related to this service in the past 7 days nor does the patient have an upcoming soonest available appointment for this issue. This work was completed in less than 7 days.   The patient consented to this service today (see patient agreement prior to ongoing communication). Patient counseled regarding the need for in-person exam for certain conditions and was advised to call the office if any changing or worsening symptoms occur.   The codes to be used for the E/M service are: '[]'$   99421 for 5-10 minutes of time spent on the inquiry. '[x]'$   [id number] for 11-20 minutes. '[]'$   X700321 for 21+ minutes.

## 2022-03-31 NOTE — Addendum Note (Signed)
Addended by: Midge Minium on: 03/31/2022 03:57 PM   Modules accepted: Orders

## 2022-04-01 ENCOUNTER — Other Ambulatory Visit (HOSPITAL_COMMUNITY): Payer: Self-pay

## 2022-04-08 ENCOUNTER — Other Ambulatory Visit (HOSPITAL_COMMUNITY): Payer: Self-pay

## 2022-04-08 NOTE — Telephone Encounter (Signed)
Pt is asking if she can try these as separate meds and see if they are covered I advised she calls her insurance but she has asked to go ahead and try it  Please advise

## 2022-04-11 ENCOUNTER — Other Ambulatory Visit (HOSPITAL_COMMUNITY): Payer: Self-pay

## 2022-04-11 MED ORDER — PHENTERMINE HCL 37.5 MG PO CAPS
37.5000 mg | ORAL_CAPSULE | ORAL | 0 refills | Status: DC
  Filled 2022-04-11: qty 90, 90d supply, fill #0

## 2022-04-11 MED ORDER — TOPIRAMATE 25 MG PO TABS
25.0000 mg | ORAL_TABLET | Freq: Two times a day (BID) | ORAL | 1 refills | Status: DC
  Filled 2022-04-11: qty 60, 30d supply, fill #0
  Filled 2022-05-08 – 2022-05-16 (×2): qty 60, 30d supply, fill #1

## 2022-04-11 NOTE — Addendum Note (Signed)
Addended by: Midge Minium on: 04/11/2022 03:59 PM   Modules accepted: Orders

## 2022-04-12 ENCOUNTER — Other Ambulatory Visit (HOSPITAL_COMMUNITY): Payer: Self-pay

## 2022-04-13 ENCOUNTER — Encounter: Payer: Self-pay | Admitting: Neurology

## 2022-04-13 ENCOUNTER — Other Ambulatory Visit (HOSPITAL_COMMUNITY): Payer: Self-pay

## 2022-04-14 ENCOUNTER — Telehealth: Payer: Self-pay | Admitting: *Deleted

## 2022-04-14 ENCOUNTER — Telehealth: Payer: Self-pay

## 2022-04-14 NOTE — Telephone Encounter (Signed)
Noted.  Pt plans to take the 2 medications separately rather than as the Qsymia combo

## 2022-04-14 NOTE — Telephone Encounter (Signed)
We received a denial from insurance for pt Rx Qsymia .  Marland Kitchen

## 2022-04-14 NOTE — Telephone Encounter (Signed)
Her next appt is 05-16-2022.  She has uploaded her insurance cards.  She needs new PA.  Thank you

## 2022-04-14 NOTE — Telephone Encounter (Signed)
Need PA on ubrelvy.  New insurance cards uploaded in [place].  Telephone note made for to send to PA team to address.

## 2022-04-20 ENCOUNTER — Other Ambulatory Visit (HOSPITAL_COMMUNITY): Payer: Self-pay

## 2022-04-20 ENCOUNTER — Other Ambulatory Visit: Payer: Self-pay | Admitting: Family Medicine

## 2022-04-20 MED ORDER — ROSUVASTATIN CALCIUM 10 MG PO TABS
10.0000 mg | ORAL_TABLET | Freq: Every day | ORAL | 3 refills | Status: DC
  Filled 2022-04-20: qty 30, 30d supply, fill #0
  Filled 2022-05-20: qty 30, 30d supply, fill #1
  Filled 2022-06-22: qty 30, 30d supply, fill #2
  Filled 2022-07-24: qty 30, 30d supply, fill #3

## 2022-04-20 MED ORDER — TERBINAFINE HCL 250 MG PO TABS
250.0000 mg | ORAL_TABLET | Freq: Every day | ORAL | 0 refills | Status: DC
  Filled 2022-04-20: qty 90, 90d supply, fill #0

## 2022-04-26 ENCOUNTER — Other Ambulatory Visit: Payer: Self-pay

## 2022-04-26 ENCOUNTER — Other Ambulatory Visit (HOSPITAL_COMMUNITY): Payer: Self-pay

## 2022-04-26 MED ORDER — UBRELVY 100 MG PO TABS
100.0000 mg | ORAL_TABLET | ORAL | 11 refills | Status: DC | PRN
  Filled 2022-04-26: qty 16, 30d supply, fill #0
  Filled 2022-06-09: qty 16, 30d supply, fill #1
  Filled 2022-07-05: qty 16, 30d supply, fill #2
  Filled 2022-08-12: qty 16, 30d supply, fill #3
  Filled 2022-10-19: qty 16, 30d supply, fill #4
  Filled 2022-11-22: qty 16, 30d supply, fill #5
  Filled 2023-01-05: qty 16, 30d supply, fill #6
  Filled 2023-02-07: qty 16, 30d supply, fill #7
  Filled 2023-03-07: qty 16, 30d supply, fill #8
  Filled 2023-04-13: qty 16, 30d supply, fill #9

## 2022-04-26 NOTE — Addendum Note (Signed)
Addended by: Brandon Melnick on: 04/26/2022 04:48 PM   Modules accepted: Orders

## 2022-04-26 NOTE — Telephone Encounter (Signed)
Patient Advocate Encounter   Received notification that prior authorization for Ubrelvy 100MG tablets is required.   PA submitted on 04/26/2022 Key [id number] Status is pending       Lyndel Safe, [place] Patient Advocate Specialist [place] Patient Advocate Team Direct Number: [phone]  Fax: [phone]

## 2022-04-27 ENCOUNTER — Other Ambulatory Visit (HOSPITAL_COMMUNITY): Payer: Self-pay

## 2022-05-03 ENCOUNTER — Encounter: Payer: Self-pay | Admitting: Family Medicine

## 2022-05-05 NOTE — Progress Notes (Signed)
Patient Care Team: Midge Minium, MD as PCP - General (Family Medicine) Lahoma Crocker, MD as Consulting Physician (Obstetrics and Gynecology) Magrinat, Virgie Dad, MD (Inactive) as Consulting Physician (Oncology) Harold Hedge, Darrick Grinder, MD as Consulting Physician (Allergy and Immunology) Melvenia Beam, MD as Consulting Physician (Neurology) Lahoma Crocker, MD as Consulting Physician (Obstetrics and Gynecology) Eyvonne Mechanic as Counselor (Licensed Clinical Social Worker)  DIAGNOSIS: No diagnosis found.  SUMMARY OF ONCOLOGIC HISTORY: Oncology History   No history exists.    CHIEF COMPLIANT: Follow-up of high risk for breast cancer, CHEK2 mutation, on tamoxifen therapy   INTERVAL HISTORY: Michele Mcbride is a 37 y.o. with above-mentioned history of high risk for breast cancer. She presents to the clinic for a follow-up.    ALLERGIES:  is allergic to strawberry extract and latex.  MEDICATIONS:  Current Outpatient Medications  Medication Sig Dispense Refill   albuterol (VENTOLIN HFA) 108 (90 Base) MCG/ACT inhaler Inhale 2 puffs into the lungs every 6 hours as needed for wheezing or shortness of breath. 18 g 6   ALPRAZolam (XANAX) 0.5 MG tablet Take 1 tablet (0.5 mg total) by mouth 2 (two) times daily as needed for anxiety. 30 tablet 1   amoxicillin-clavulanate (AUGMENTIN) 875-125 MG tablet Take 1 tablet by mouth 2 (two) times daily. 20 tablet 0   Biotin 10 MG CAPS Take 10 mg by mouth daily.     Cholecalciferol (VITAMIN D3) 25 MCG (1000 UT) CAPS Take 25 capsules by mouth daily.     DULoxetine (CYMBALTA) 20 MG capsule Take 1 capsule (20 mg total) by mouth daily. 30 capsule 3   meloxicam (MOBIC) 15 MG tablet Take 1 tablet (15 mg total) by mouth daily. 30 tablet 3   methocarbamol (ROBAXIN) 750 MG tablet Take 1 tablet (750 mg total) by mouth every 8 (eight) hours as needed for muscle spasms. 45 tablet 1   norethindrone (MICRONOR) 0.35 MG tablet Take 1 tablet by mouth  daily. 30 tablet 11   ondansetron (ZOFRAN-ODT) 4 MG disintegrating tablet Dissolve 1-2 tablets (4-8 mg total) by mouth every 8 (eight) hours as needed for nausea. 30 tablet 3   phentermine 37.5 MG capsule Take 1 capsule (37.5 mg total) by mouth every morning. 90 capsule 0   rosuvastatin (CRESTOR) 10 MG tablet Take 1 tablet (10 mg total) by mouth daily. 30 tablet 3   spironolactone (ALDACTONE) 100 MG tablet Take 1 tablet (100 mg total) by mouth daily. 30 tablet 3   terbinafine (LAMISIL) 250 MG tablet Take 1 tablet (250 mg total) by mouth daily. 90 tablet 0   topiramate (TOPAMAX) 25 MG tablet Take 1 tablet (25 mg total) by mouth 2 (two) times daily. 60 tablet 1   Ubrogepant (UBRELVY) 100 MG TABS Take 1 tablet by mouth at onset of migraine --may repeat in 2 hours as needed. Maximum 2 tablets per day. 16 tablet 11   VITAMIN D PO Take 2,000 Units by mouth daily. OTC     No current facility-administered medications for this visit.    PHYSICAL EXAMINATION: ECOG PERFORMANCE STATUS: {CHL ONC ECOG PS:[phone]}  There were no vitals filed for this visit. There were no vitals filed for this visit.  BREAST:*** No palpable masses or nodules in either right or left breasts. No palpable axillary supraclavicular or infraclavicular adenopathy no breast tenderness or nipple discharge. (exam performed in the presence of a chaperone)  LABORATORY DATA:  I have reviewed the data as listed    Latest  Ref Rng & Units 01/24/2022    2:00 PM 08/03/2021    8:52 AM 05/21/2021    1:59 PM  CMP  Glucose 70 - 99 mg/dL 92  103  79   BUN 6 - 23 mg/dL '10  13  10   '$ Creatinine 0.40 - 1.20 mg/dL 0.92  0.97  0.91   Sodium 135 - 145 mEq/L 144  139  142   Potassium 3.5 - 5.1 mEq/L 3.8  3.9  3.5   Chloride 96 - 112 mEq/L 106  103  106   CO2 19 - 32 mEq/L '30  28  27   '$ Calcium 8.4 - 10.5 mg/dL 9.1  9.4  9.4   Total Protein 6.0 - 8.3 g/dL 7.1  6.9  7.0   Total Bilirubin 0.2 - 1.2 mg/dL 0.2  0.5  0.5   Alkaline Phos 39 - 117  U/L 68  80  86   AST 0 - 37 U/L '12  13  11   '$ ALT 0 - 35 U/L '9  14  10     '$ Lab Results  Component Value Date   WBC 6.8 01/24/2022   HGB 14.5 01/24/2022   HCT 42.8 01/24/2022   MCV 93.1 01/24/2022   PLT 246.0 01/24/2022   NEUTROABS 4.7 01/24/2022    ASSESSMENT & PLAN:  No problem-specific Assessment & Plan notes found for this encounter.    No orders of the defined types were placed in this encounter.  The patient has a good understanding of the overall plan. she agrees with it. she will call with any problems that may develop before the next visit here. Total time spent: 30 mins including face to face time and time spent for planning, charting and co-ordination of care   Suzzette Righter, [place] 05/05/22    I Gardiner Coins am acting as a Education administrator for [employer]  ***

## 2022-05-09 ENCOUNTER — Inpatient Hospital Stay: Payer: Commercial Managed Care - PPO | Attending: Hematology and Oncology | Admitting: Hematology and Oncology

## 2022-05-09 ENCOUNTER — Telehealth: Payer: Self-pay | Admitting: Hematology and Oncology

## 2022-05-09 VITALS — BP 105/72 | HR 97 | Temp 98.4°F | Resp 22 | Wt 175.5 lb

## 2022-05-09 DIAGNOSIS — Z1589 Genetic susceptibility to other disease: Secondary | ICD-10-CM

## 2022-05-09 DIAGNOSIS — Z7183 Encounter for nonprocreative genetic counseling: Secondary | ICD-10-CM | POA: Diagnosis not present

## 2022-05-09 DIAGNOSIS — Z1509 Genetic susceptibility to other malignant neoplasm: Secondary | ICD-10-CM | POA: Diagnosis not present

## 2022-05-09 DIAGNOSIS — Z79810 Long term (current) use of selective estrogen receptor modulators (SERMs): Secondary | ICD-10-CM | POA: Diagnosis not present

## 2022-05-09 DIAGNOSIS — Z1502 Genetic susceptibility to malignant neoplasm of ovary: Secondary | ICD-10-CM

## 2022-05-09 DIAGNOSIS — Z1501 Genetic susceptibility to malignant neoplasm of breast: Secondary | ICD-10-CM

## 2022-05-09 MED ORDER — TAMOXIFEN CITRATE 20 MG PO TABS: 20.0000 mg | ORAL_TABLET | Freq: Every day | ORAL | 3 refills | Status: DC

## 2022-05-09 NOTE — Assessment & Plan Note (Signed)
Breast cancer surveillance:  1.  Breast MRI 11/03/2020: Benign breast density category B 2. 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  Her baby is almost a-year-old.  She resumed tamoxifen in August 2022.    04/13/2021: Evaluation of palpable right submandibular mass: Complex lesion 1.8 cm 11/10/2022: Breast MRI: Benign Continue annual mammograms and breast MRIs    Return to clinic in 1 year for follow-up.

## 2022-05-09 NOTE — Telephone Encounter (Signed)
Scheduled appointment per 3/4 los. Left voicemail.

## 2022-05-14 ENCOUNTER — Telehealth: Payer: Commercial Managed Care - PPO | Admitting: Family

## 2022-05-14 DIAGNOSIS — M5441 Lumbago with sciatica, right side: Secondary | ICD-10-CM | POA: Diagnosis not present

## 2022-05-14 MED ORDER — PREDNISONE 10 MG (21) PO TBPK: ORAL_TABLET | ORAL | 0 refills | Status: DC

## 2022-05-14 MED ORDER — BACLOFEN 10 MG PO TABS: 10.0000 mg | ORAL_TABLET | Freq: Three times a day (TID) | ORAL | 0 refills | Status: DC

## 2022-05-14 MED ORDER — NAPROXEN 500 MG PO TABS: 500.0000 mg | ORAL_TABLET | Freq: Two times a day (BID) | ORAL | 0 refills | Status: DC

## 2022-05-14 NOTE — Progress Notes (Signed)

## 2022-05-16 ENCOUNTER — Encounter: Payer: Self-pay | Admitting: Neurology

## 2022-05-16 ENCOUNTER — Ambulatory Visit (INDEPENDENT_AMBULATORY_CARE_PROVIDER_SITE_OTHER): Payer: Commercial Managed Care - PPO | Admitting: Neurology

## 2022-05-16 ENCOUNTER — Other Ambulatory Visit (HOSPITAL_COMMUNITY): Payer: Self-pay

## 2022-05-16 VITALS — BP 121/82 | HR 110 | Ht 62.0 in | Wt 174.2 lb

## 2022-05-16 DIAGNOSIS — M5431 Sciatica, right side: Secondary | ICD-10-CM

## 2022-05-16 DIAGNOSIS — G43009 Migraine without aura, not intractable, without status migrainosus: Secondary | ICD-10-CM

## 2022-05-16 DIAGNOSIS — M5432 Sciatica, left side: Secondary | ICD-10-CM | POA: Diagnosis not present

## 2022-05-16 MED ORDER — GABAPENTIN 300 MG PO CAPS
300.0000 mg | ORAL_CAPSULE | Freq: Three times a day (TID) | ORAL | 11 refills | Status: DC
  Filled 2022-05-16: qty 90, 30d supply, fill #0
  Filled 2022-06-09: qty 90, 30d supply, fill #1
  Filled 2022-07-10 – 2022-07-18 (×2): qty 90, 30d supply, fill #2
  Filled 2022-08-10: qty 90, 30d supply, fill #3
  Filled 2022-10-19: qty 90, 30d supply, fill #4
  Filled 2022-11-22: qty 90, 30d supply, fill #5
  Filled 2023-01-05: qty 90, 30d supply, fill #6
  Filled 2023-02-07: qty 90, 30d supply, fill #7
  Filled 2023-03-07: qty 90, 30d supply, fill #8
  Filled 2023-04-13: qty 90, 30d supply, fill #9

## 2022-05-16 NOTE — Patient Instructions (Signed)
Continue ubrelvy Gabapentin for pain  Gabapentin Capsules or Tablets What is this medication? GABAPENTIN (GA ba pen tin) treats nerve pain. It may also be used to prevent and control seizures in people with epilepsy. It works by calming overactive nerves in your body. This medicine may be used for other purposes; ask your health care provider or pharmacist if you have questions. COMMON BRAND NAME(S): Active-PAC with Gabapentin, Orpha Bur, Gralise, Neurontin What should I tell my care team before I take this medication? They need to know if you have any of these conditions: Alcohol or substance use disorder Kidney disease Lung or breathing disease Suicidal thoughts, plans, or attempt; a previous suicide attempt by you or a family member An unusual or allergic reaction to gabapentin, other medications, foods, dyes, or preservatives Pregnant or trying to get pregnant Breast-feeding How should I use this medication? Take this medication by mouth with a glass of water. Follow the directions on the prescription label. You can take it with or without food. If it upsets your stomach, take it with food. Take your medication at regular intervals. Do not take it more often than directed. Do not stop taking except on your care team's advice. If you are directed to break the 600 or 800 mg tablets in half as part of your dose, the extra half tablet should be used for the next dose. If you have not used the extra half tablet within 28 days, it should be thrown away. A special MedGuide will be given to you by the pharmacist with each prescription and refill. Be sure to read this information carefully each time. Talk to your care team about the use of this medication in children. While this medication may be prescribed for children as young as 3 years for selected conditions, precautions do apply. Overdosage: If you think you have taken too much of this medicine contact a poison control center or emergency room  at once. NOTE: This medicine is only for you. Do not share this medicine with others. What if I miss a dose? If you miss a dose, take it as soon as you can. If it is almost time for your next dose, take only that dose. Do not take double or extra doses. What may interact with this medication? Alcohol Antihistamines for allergy, cough, and cold Certain medications for anxiety or sleep Certain medications for depression like amitriptyline, fluoxetine, sertraline Certain medications for seizures like phenobarbital, primidone Certain medications for stomach problems General anesthetics like halothane, isoflurane, methoxyflurane, propofol Local anesthetics like lidocaine, pramoxine, tetracaine Medications that relax muscles for surgery Opioid medications for pain Phenothiazines like chlorpromazine, mesoridazine, prochlorperazine, thioridazine This list may not describe all possible interactions. Give your health care provider a list of all the medicines, herbs, non-prescription drugs, or dietary supplements you use. Also tell them if you smoke, drink alcohol, or use illegal drugs. Some items may interact with your medicine. What should I watch for while using this medication? Visit your care team for regular checks on your progress. You may want to keep a record at home of how you feel your condition is responding to treatment. You may want to share this information with your care team at each visit. You should contact your care team if your seizures get worse or if you have any new types of seizures. Do not stop taking this medication or any of your seizure medications unless instructed by your care team. Stopping your medication suddenly can increase your seizures or their severity.  This medication may cause serious skin reactions. They can happen weeks to months after starting the medication. Contact your care team right away if you notice fevers or flu-like symptoms with a rash. The rash may be red  or purple and then turn into blisters or peeling of the skin. Or, you might notice a red rash with swelling of the face, lips or lymph nodes in your neck or under your arms. Wear a medical identification bracelet or chain if you are taking this medication for seizures. Carry a card that lists all your medications. This medication may affect your coordination, reaction time, or judgment. Do not drive or operate machinery until you know how this medication affects you. Sit up or stand slowly to reduce the risk of dizzy or fainting spells. Drinking alcohol with this medication can increase the risk of these side effects. Your mouth may get dry. Chewing sugarless gum or sucking hard candy, and drinking plenty of water may help. Watch for new or worsening thoughts of suicide or depression. This includes sudden changes in mood, behaviors, or thoughts. These changes can happen at any time but are more common in the beginning of treatment or after a change in dose. Call your care team right away if you experience these thoughts or worsening depression. If you become pregnant while using this medication, you may enroll in the [place] Pregnancy Registry by calling [id number]. This registry collects information about the safety of antiepileptic medication use during pregnancy. What side effects may I notice from receiving this medication? Side effects that you should report to your care team as soon as possible: Allergic reactions or angioedema--skin rash, itching, hives, swelling of the face, eyes, lips, tongue, arms, or legs, trouble swallowing or breathing Rash, fever, and swollen lymph nodes Thoughts of suicide or self harm, worsening mood, feelings of depression Trouble breathing Unusual changes in mood or behavior in children after use such as difficulty concentrating, hostility, or restlessness Side effects that usually do not require medical attention (report to your care team  if they continue or are bothersome): Dizziness Drowsiness Nausea Swelling of ankles, feet, or hands Vomiting This list may not describe all possible side effects. Call your doctor for medical advice about side effects. You may report side effects to FDA at 1-800-FDA-1088. Where should I keep my medication? Keep out of reach of children and pets. Store at room temperature between 15 and 30 degrees C (59 and 86 degrees F). Get rid of any unused medication after the expiration date. This medication may cause accidental overdose and death if taken by other adults, children, or pets. To get rid of medications that are no longer needed or have expired: Take the medication to a medication take-back program. Check with your pharmacy or law enforcement to find a location. If you cannot return the medication, check the label or package insert to see if the medication should be thrown out in the garbage or flushed down the toilet. If you are not sure, ask your care team. If it is safe to put it in the trash, empty the medication out of the container. Mix the medication with cat litter, dirt, coffee grounds, or other unwanted substance. Seal the mixture in a bag or container. Put it in the trash. NOTE: This sheet is a summary. It may not cover all possible information. If you have questions about this medicine, talk to your doctor, pharmacist, or health care provider.  2023 Elsevier/Gold Standard (2020-08-24 00:00:00)

## 2022-05-16 NOTE — Progress Notes (Signed)
[facility]    Provider:  Dr Jaynee Eagles Referring Provider: Dr. Patrecia Pour Primary Care Physician:  Dr. Patrecia Pour  CC:  Migraines  05/16/2022: She has had a lot of stress but still doesn't feel like she needs a prevenetative, she has 4 migraine days a month and < 8 total headache days a month. Doing much better. Roselyn Meier helps will jyust give ubrelvy. We already sent in refill os ubrelvy for her she is set there  She is having back pain shoting acorss the low back and shooting down the leg. She has flares of it sometimes left and sometimes the right. Sciatica. Ongoing for years. Recent flare because she turned wrong. Hurts. She can walk. No changes in bowels or bladder, ongoing 3 days, getting better still painful. She is on a steroid dosepak and helping but takes time. Never tried gabapentin. Will give gabapentin.   Patient complains of symptoms per HPI as well as the following symptoms: sciatica . Pertinent negatives and positives per HPI. All others negative   Interval history 05/18/2021: Patient returns today for follow-up, have not seen her since 2021 when she was pregnant and had stopped most of her medications.  Prior to that she was on Topamax and Aimovig.  She has had headaches/migraines since the age of 52, started medications at the age of 59 and patient has been to multiple neurologist.  We started her on the CGRP medications and she did extremely well.  She was very happy with Aimovig and Trokendi. She did well the year after pregnancy, she breast fed for 6 months. She has had more stress in the last 3 months and migraines have worsened. She was out of work last Friday for a terrible migraine. In the last 2 weeks she has had 2 migraines, but she has had ncreasing headache frequency every day for 2 weeks, prior to the last 2 weeks it was more like 4-6 total headache days a month. Currently 4 migraine days a month + headaches = between 4-14 total headache days a month.  Discussed options, prevention vs acute.  Patient complains of symptoms per HPI as well as the following symptoms: stress . Pertinent negatives and positives per HPI. All others negative   Interval history 10/14/2019; patient is pregnant and due in March. She had stopped most of her medications including the topamax and aimovig, in fact most medications were stopped in April. She is [redacted] weeks pregnant and have seen the ultrasound, this was a surprise due to her pcos. In July she had several days of headaches, having 5 headache days. We discussed pregnancy and migraines, no medictaion is safe, I gave her literature and we reviewed the possible migraine preventatives and acute medications and risks in pregnancy.  Patient complains of symptoms per HPI as well as the following symptoms: headache, fatigue, stress, pregnancy . Pertinent negatives and positives per HPI. All others negative   Interval history 10/08/2018: This is a 37 year old patient who is here for follow-up of migraines.  She has had migraine since the age of 59.  Started medications at the age of 80 and been to multiple neurologists.  She has been doing very well on CGRP medications the last year.  She has been under stress in the past.  Aimovig and Trokendi have been very successful.  We did fill out her FMLA forms and we will repeat those for the next year. Lots of stress but different than last year. 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   Treid: axert, maxalt, sumatriptan, amerge worked the best, trig zomig po and nasal, topiramate, amitriptyline and nortriptyline,Topamax currently, propranolol, fioricetl, exapro, zofran, imitrex, axert, relpax, has been on Topamax '200mg'$ , reglan, robaxin, tizanidine, Lexapro  Interval history 12/11/2017: She is under a lot of stress, her parents moved in with her. She supports 5 people. Her ex-husband lost his job and so decreased child support. She is on Aimovig.  She does not want to have botox completed. Discussed strategies for stress management. She feels the Aimovig is improving her migraines for now. Will continue on the Trokendi.  HPI:  Michele Mcbride is a 37 y.o. female here as a referral from Dr. Birdie Riddle for migraines. Suffering since the age of 37. Started medications at the age of 34. She has seen previous neurologists. She wakes with migraines and worse bending over and with valsalva. Can take her out of work for 3 days. She has light sensitivity, sound sensitivity, nausea, vomiting, worse with movement, its unilateral either side may spread to the whole head, pounding throbbing, can be severe. 22 headache days a month, 5 severe migraine days, 5-7 moderately severe migraines, 10 mild migraines or dull headaches. Can worsen with bending over and valsalva. No aura, + vision changes, no weakness or numbness. No medication overuse. This frequency and severity has been ongoing for a year and intractable. Mother, sister,2 aunts, cousin, grandmother have migraines. She has associated neck pain/ No other focal neurologic deficits, associated symptoms, inciting events or modifiable factors. She has examined food triggers, red wine trigers, garlic, sleeping well and getting enough sleep, exercises. No other focal neurologic deficits, associated symptoms, inciting events or modifiable factors.  Medications tried: Topamax currently, propranolol, fioricetl, exapro, zofran, imitrex, axert, relpax, has been on Topamax '200mg'$ , reglan, robaxin, tizanidine, Lexapro  Reviewed notes, labs and imaging from outside physicians, which showed:  TSH normal  Dg cervical spine 2004: reviewed report:  [facility]. LUMBAR SPINE 4 VIEWS THE PATIENT HAS FIVE NON-RIB-BEARING LUMBAR VERTEBRAE.  THE ALIGNMENT IS ANATOMIC.  POSTERIOR ELEMENTS ARE INTACT.  SACROILIAC JOINTS ARE SYMMETRIC.  THE SACRUM IS NORMAL. IMPRESSION NEGATIVE FOR FRACTURE.  Review of  Systems: Patient complains of symptoms per HPI as well as the following symptoms: headache. Pertinent negatives and positives per HPI. All others negative.   Social History   Socioeconomic History   Marital status: Divorced    Spouse name: Not on file   Number of children: 2   Years of education: LPN   Highest education level: Not on file  Occupational History   Not on file  Tobacco Use   Smoking status: Never   Smokeless tobacco: Never  Vaping Use   Vaping Use: Never used  Substance and Sexual Activity   Alcohol use: No   Drug use: No   Sexual activity: Yes  Other Topics Concern   Not on file  Social History Narrative   Lives at home with her 2 children   Right handed   Caffeine: 2 cups daily at max   Social Determinants of Health   Financial Resource Strain: Not on file  Food Insecurity: Not on file  Transportation Needs: Not on file  Physical Activity: Not on file  Stress: Not on file  Social Connections: Not on file  Intimate Partner Violence: Not on file    Family History  Problem Relation Age of Onset   Hyperlipidemia Mother    Neuropathy Mother  Migraines Mother    Breast cancer Mother 34   Migraines Other        sisters, aunts, and grandmother had migraines on mother's side   Breast cancer Paternal Aunt     Past Medical History:  Diagnosis Date   Anxiety    Asthma    Family history of breast cancer    Gestational diabetes    metformin   Migraine    Monoallelic mutation of CHEK2 gene in female patient    PCOS (polycystic ovarian syndrome)    Sinus infection     Past Surgical History:  Procedure Laterality Date   BREAST LUMPECTOMY WITH RADIOACTIVE SEED LOCALIZATION Bilateral 06/20/2019   Procedure: BILATERAL BREAST LUMPECTOMY WITH RADIOACTIVE SEED LOCALIZATION, Right x 2 and Left x1;  Surgeon: Erroll Luna, MD;  Location: [facility];  Service: General;  Laterality: Bilateral;   WISDOM TOOTH EXTRACTION      Current  Outpatient Medications  Medication Sig Dispense Refill   albuterol (VENTOLIN HFA) 108 (90 Base) MCG/ACT inhaler Inhale 2 puffs into the lungs every 6 (six) hours as needed for wheezing or shortness of breath. 8 g 0   ALPRAZolam (XANAX) 0.5 MG tablet Take 1 tablet (0.5 mg total) by mouth 2 (two) times daily as needed for anxiety. 30 tablet 1   fluticasone (FLONASE) 50 MCG/ACT nasal spray Place 2 sprays into both nostrils daily. 16 g 6   norethindrone (MICRONOR) 0.35 MG tablet Take 1 tablet by mouth daily 84 tablet 3   promethazine (PHENERGAN) 25 MG tablet Take 1 tablet (25 mg total) by mouth every 8 (eight) hours as needed for nausea or vomiting. 20 tablet 0   tamoxifen (NOLVADEX) 20 MG tablet Take 1 tablet (20 mg total) by mouth daily. 90 tablet 3   VITAMIN D PO Take 2,000 Units by mouth daily. OTC     Current Facility-Administered Medications  Medication Dose Route Frequency Provider Last Rate Last Admin   terbinafine (LAMISIL) 1 % cream   Topical BID Hedges, Jeffrey, PA-C        Allergies as of 05/18/2021 - Review Complete 05/18/2021  Allergen Reaction Noted   Strawberry extract Anaphylaxis 03/06/2014   Latex Rash 04/04/2014    Vitals: BP (!) 130/91   Pulse 75   Ht '5\' 2"'$  (1.575 m)   Wt 165 lb 8 oz (75.1 kg)   BMI 30.27 kg/m  Last Weight:  Wt Readings from Last 1 Encounters:  05/18/21 165 lb 8 oz (75.1 kg)   Last Height:   Ht Readings from Last 1 Encounters:  05/18/21 '5\' 2"'$  (1.575 m)   E.Exam: NAD, pleasant                  Speech:    Speech is normal; fluent and spontaneous with normal comprehension.  Cognition:    The patient is oriented to person, place, and time;     recent and remote memory intact;     language fluent;    Cranial Nerves:    The pupils are equal, round, and reactive to light.Trigeminal sensation is intact and the muscles of mastication are normal. The face is symmetric. The palate elevates in the midline. Hearing intact. Voice is normal. Shoulder  shrug is normal. The tongue has normal motion without fasciculations.   Coordination:  No dysmetria  Motor Observation:    No asymmetry, no atrophy, and no involuntary movements noted. Tone:    Normal muscle tone.     Strength:  Strength is V/V in the upper and lower limbs.      Sensation: intact to LT  Gait: antalgic, in pain   Assessment/Plan:  37 year old with migraines  Continue Ubrelvy prn (failed imitrex, zmig, amerge and multiple other triptans)  Back pain: On steroid dose pak On meloxicam Start gabapentin prn  Meds ordered this encounter  Medications   gabapentin (NEURONTIN) 300 MG capsule    Sig: Take 1 capsule (300 mg) by mouth 3 times daily. Alternatively may take one in the morning and 2 at bedtime if the pain is worse at night.    Dispense:  90 capsule    Refill:  11     Discussed: To prevent or relieve headaches, try the following: Cool Compress. Lie down and place a cool compress on your head.  Avoid headache triggers. If certain foods or odors seem to have triggered your migraines in the past, avoid them. A headache diary might help you identify triggers.  Include physical activity in your daily routine. Try a daily walk or other moderate aerobic exercise.  Manage stress. Find healthy ways to cope with the stressors, such as delegating tasks on your to-do list.  Practice relaxation techniques. Try deep breathing, yoga, massage and visualization.  Eat regularly. Eating regularly scheduled meals and maintaining a healthy diet might help prevent headaches. Also, drink plenty of fluids.  Follow a regular sleep schedule. Sleep deprivation might contribute to headaches Consider biofeedback. With this mind-body technique, you learn to control certain bodily functions -- such as muscle tension, heart rate and blood pressure -- to prevent headaches or reduce headache pain.    Proceed to emergency room if you experience new or worsening symptoms or symptoms do  not resolve, if you have new neurologic symptoms or if headache is severe, or for any concerning symptom.    PRIOR ASSESSMENT AND PLAN: 10/14/2019  - We discussed pregnancy and migraines, no medictaion is safe, I gave her literature and we reviewed the possible migraine preventatives and acute medications and risks in pregnancy. - For acute management: Reglan, Periactin. Tylenol #3 only for severe migraines - Email obgyn but I think lexapro is ow risk and may help with stress and migraines as well - If needed consider propranolol,labetalol, gabapentin. Periactin, nortriptyline as preventative - in 3rd trimester triptans would be considered low risk but discuss with obgyn first   PRIOR:  Start Lexapro. Happy to start Wellbutrin as well if needed. Continue Topiramate, Aimovig - doing well Try for acute management: Tosymra (free copay card), Nurtex (free copay card) Amerge orally - don;t use amerge with tosymra.  Tosymra - QUICK in 5 minutes but only lasts a few hours take for acute onset migraie Amerge - fast acting within an hour but lasts longer 6 hours is 1/2 life, take when don;t need immediate relief(take Tosymra for that) Nurtex: take as soon as possible and can combine with a triptan  Prior:  - dry needling for cervical myofascial pain ordered  - MRI brain w/wo contrast unremarkable  -Discussed botox she would like Aimovig initially for migraineprevention. She does not want to have Botox.    - Dry needling for cervical myofascial pain  - Continue Trokendi  - Continue flexeril for neck and muscle spasms  - Zomig did not help at all. She is back on Imitrex despite the side effects, it works.    Sarina Ill, MD  [facility] [address] [place], [place] [address]  Phone [phone]  Fax [phone]   I spent over 30 minutes of face-to-face and non-face-to-face time with patient on the  1. Migraine without aura and without status  migrainosus, not intractable   2. Bilateral sciatica    diagnosis.  This included previsit chart review, lab review, study review, order entry, electronic health record documentation, patient education on the different diagnostic and therapeutic options, counseling and coordination of care, risks and benefits of management, compliance, or risk factor reduction

## 2022-05-17 ENCOUNTER — Other Ambulatory Visit (HOSPITAL_COMMUNITY): Payer: Self-pay

## 2022-05-17 ENCOUNTER — Other Ambulatory Visit: Payer: Self-pay | Admitting: Neurology

## 2022-05-17 MED ORDER — CYCLOBENZAPRINE HCL 10 MG PO TABS
10.0000 mg | ORAL_TABLET | Freq: Three times a day (TID) | ORAL | 0 refills | Status: DC | PRN
  Filled 2022-05-17: qty 90, 30d supply, fill #0

## 2022-05-18 ENCOUNTER — Other Ambulatory Visit: Payer: Self-pay

## 2022-05-18 ENCOUNTER — Other Ambulatory Visit (HOSPITAL_COMMUNITY): Payer: Self-pay

## 2022-05-23 ENCOUNTER — Encounter: Payer: Self-pay | Admitting: Family Medicine

## 2022-05-23 ENCOUNTER — Ambulatory Visit (INDEPENDENT_AMBULATORY_CARE_PROVIDER_SITE_OTHER): Payer: Commercial Managed Care - PPO | Admitting: Family Medicine

## 2022-05-23 ENCOUNTER — Other Ambulatory Visit (HOSPITAL_COMMUNITY): Payer: Self-pay

## 2022-05-23 VITALS — BP 128/76 | HR 105 | Temp 98.3°F | Resp 17 | Ht 62.0 in | Wt 177.2 lb

## 2022-05-23 DIAGNOSIS — Z0000 Encounter for general adult medical examination without abnormal findings: Secondary | ICD-10-CM | POA: Diagnosis not present

## 2022-05-23 DIAGNOSIS — E559 Vitamin D deficiency, unspecified: Secondary | ICD-10-CM | POA: Insufficient documentation

## 2022-05-23 DIAGNOSIS — E785 Hyperlipidemia, unspecified: Secondary | ICD-10-CM

## 2022-05-23 MED ORDER — DULOXETINE HCL 30 MG PO CPEP
30.0000 mg | ORAL_CAPSULE | Freq: Every day | ORAL | 1 refills | Status: DC
  Filled 2022-05-23: qty 90, 90d supply, fill #0
  Filled 2022-08-21: qty 90, 90d supply, fill #1

## 2022-05-23 NOTE — Assessment & Plan Note (Signed)
Chronic problem.  Check labs and start medication prn

## 2022-05-23 NOTE — Patient Instructions (Signed)
Follow up in 6 months to recheck BP and cholesterol We'll notify you of your lab results and make any changes if needed Continue to work on healthy diet and regular exercise- you can do it!! Ask [facility] and [facility] about your most recent tetanus Call with any questions or concerns Stay Safe!  Stay Healthy!

## 2022-05-23 NOTE — Assessment & Plan Note (Signed)
Pt's PE WNL w/ exception of BMI.  Will research where she had her Tdap done.  UTD on flu, pap.  Check labs.  Anticipatory guidance provided.

## 2022-05-23 NOTE — Progress Notes (Signed)
   Subjective:    Patient ID: Michele Mcbride, female    DOB: 1985/12/08, 37 y.o.   MRN: [id number]  HPI CPE- UTD on pap, flu.  Pt reports Tdap w/ recent pregnancy 2 yrs ago  Patient Care Team    Relationship Specialty Notifications Start End  Midge Minium, MD PCP - General Family Medicine  08/03/21   Lahoma Crocker, MD Consulting Physician Obstetrics and Gynecology  04/29/16   Magrinat, Virgie Dad, MD (Inactive) Consulting Physician Oncology  01/14/19   Harold Hedge, Darrick Grinder, MD Consulting Physician Allergy and Immunology  01/14/19   Melvenia Beam, MD Consulting Physician Neurology  01/14/19   Lahoma Crocker, MD Consulting Physician Obstetrics and Gynecology  01/14/19   Faith Rogue T Counselor Licensed Clinical Social Worker  01/14/19     Health Maintenance  Topic Date Due   PAP SMEAR-Modifier  09/15/2024   INFLUENZA VACCINE  Completed   Hepatitis C Screening  Completed   HIV Screening  Completed   HPV VACCINES  Aged Out   DTaP/Tdap/Td  Discontinued   COVID-19 Vaccine  Discontinued     Review of Systems Patient reports no vision/ hearing changes, adenopathy,fever, weight change,  persistant/recurrent hoarseness , swallowing issues, chest pain, palpitations, edema, persistant/recurrent cough, hemoptysis, dyspnea (rest/exertional/paroxysmal nocturnal), gastrointestinal bleeding (melena, rectal bleeding), abdominal pain, significant heartburn, bowel changes, GU symptoms (dysuria, hematuria, incontinence), Gyn symptoms (abnormal  bleeding, pain),  syncope, focal weakness, memory loss, numbness & tingling, skin/hair/nail changes, abnormal bruising or bleeding, anxiety, or depression.     Objective:   Physical Exam General Appearance:    Alert, cooperative, no distress, appears stated age, obese  Head:    Normocephalic, without obvious abnormality, atraumatic  Eyes:    PERRL, conjunctiva/corneas clear, EOM's intact both eyes  Ears:    Normal TM's and external ear canals,  both ears  Nose:   Nares normal, septum midline, mucosa normal, no drainage    or sinus tenderness  Throat:   Lips, mucosa, and tongue normal; teeth and gums normal  Neck:   Supple, symmetrical, trachea midline, no adenopathy;    Thyroid: no enlargement/tenderness/nodules  Back:     Symmetric, no curvature, ROM normal, no CVA tenderness  Lungs:     Clear to auscultation bilaterally, respirations unlabored  Chest Wall:    No tenderness or deformity   Heart:    Regular rate and rhythm, S1 and S2 normal, no murmur, rub   or gallop  Breast Exam:    Deferred to GYN  Abdomen:     Soft, non-tender, bowel sounds active all four quadrants,    no masses, no organomegaly  Genitalia:    Deferred to GYN  Rectal:    Extremities:   Extremities normal, atraumatic, no cyanosis or edema  Pulses:   2+ and symmetric all extremities  Skin:   Skin color, texture, turgor normal, no rashes or lesions  Lymph nodes:   Cervical, supraclavicular, and axillary nodes normal  Neurologic:   CNII-XII intact, normal strength, sensation and reflexes    throughout          Assessment & Plan:

## 2022-05-23 NOTE — Assessment & Plan Note (Signed)
Check labs and replete prn. 

## 2022-05-24 LAB — CBC WITH DIFFERENTIAL/PLATELET
Basophils Absolute: 0 10*3/uL (ref 0.0–0.1)
Basophils Relative: 0.4 % (ref 0.0–3.0)
Eosinophils Absolute: 0.1 10*3/uL (ref 0.0–0.7)
Eosinophils Relative: 1 % (ref 0.0–5.0)
HCT: 47.2 % — ABNORMAL HIGH (ref 36.0–46.0)
Hemoglobin: 15.7 g/dL — ABNORMAL HIGH (ref 12.0–15.0)
Lymphocytes Relative: 22.2 % (ref 12.0–46.0)
Lymphs Abs: 2.4 10*3/uL (ref 0.7–4.0)
MCHC: 33.3 g/dL (ref 30.0–36.0)
MCV: 94.1 fl (ref 78.0–100.0)
Monocytes Absolute: 0.7 10*3/uL (ref 0.1–1.0)
Monocytes Relative: 6.6 % (ref 3.0–12.0)
Neutro Abs: 7.6 10*3/uL (ref 1.4–7.7)
Neutrophils Relative %: 69.8 % (ref 43.0–77.0)
Platelets: 268 10*3/uL (ref 150.0–400.0)
RBC: 5.01 Mil/uL (ref 3.87–5.11)
RDW: 14.2 % (ref 11.5–15.5)
WBC: 10.9 10*3/uL — ABNORMAL HIGH (ref 4.0–10.5)

## 2022-05-24 LAB — BASIC METABOLIC PANEL
BUN: 16 mg/dL (ref 6–23)
CO2: 26 mEq/L (ref 19–32)
Calcium: 9.4 mg/dL (ref 8.4–10.5)
Chloride: 103 mEq/L (ref 96–112)
Creatinine, Ser: 1.09 mg/dL (ref 0.40–1.20)
GFR: 64.7 mL/min (ref >60.00)
Glucose, Bld: 98 mg/dL (ref 70–99)
Potassium: 4.2 mEq/L (ref 3.5–5.1)
Sodium: 141 mEq/L (ref 135–145)

## 2022-05-24 LAB — HEPATIC FUNCTION PANEL
ALT: 12 U/L (ref 0–35)
AST: 11 U/L (ref 0–37)
Albumin: 4.2 g/dL (ref 3.5–5.2)
Alkaline Phosphatase: 76 U/L (ref 39–117)
Bilirubin, Direct: 0 mg/dL (ref 0.0–0.3)
Total Bilirubin: 0.2 mg/dL (ref 0.2–1.2)
Total Protein: 6.9 g/dL (ref 6.0–8.3)

## 2022-05-24 LAB — LIPID PANEL
Cholesterol: 142 mg/dL (ref 0–200)
HDL: 51.4 mg/dL (ref >39.00)
LDL Cholesterol: 57 mg/dL (ref 0–99)
NonHDL: 90.66
Total CHOL/HDL Ratio: 3
Triglycerides: 166 mg/dL — ABNORMAL HIGH (ref 0.0–149.0)
VLDL: 33.2 mg/dL (ref 0.0–40.0)

## 2022-05-24 LAB — VITAMIN D 25 HYDROXY (VIT D DEFICIENCY, FRACTURES): VITD: 28.9 ng/mL — ABNORMAL LOW (ref 30.00–100.00)

## 2022-05-24 LAB — TSH: TSH: 1.29 u[IU]/mL (ref 0.35–5.50)

## 2022-05-25 ENCOUNTER — Other Ambulatory Visit (HOSPITAL_COMMUNITY): Payer: Self-pay

## 2022-05-25 ENCOUNTER — Telehealth: Payer: Self-pay

## 2022-05-25 ENCOUNTER — Other Ambulatory Visit: Payer: Self-pay

## 2022-05-25 DIAGNOSIS — E785 Hyperlipidemia, unspecified: Secondary | ICD-10-CM

## 2022-05-25 MED ORDER — VITAMIN D (ERGOCALCIFEROL) 1.25 MG (50000 UNIT) PO CAPS
50000.0000 [IU] | ORAL_CAPSULE | ORAL | 12 refills | Status: DC
  Filled 2022-05-25: qty 7, 49d supply, fill #0
  Filled 2022-07-10 – 2022-07-18 (×2): qty 7, 49d supply, fill #1
  Filled 2022-08-31: qty 7, 49d supply, fill #2
  Filled 2022-10-19: qty 7, 49d supply, fill #3
  Filled 2023-01-05: qty 7, 49d supply, fill #4
  Filled 2023-02-19: qty 7, 49d supply, fill #5
  Filled 2023-04-13: qty 7, 49d supply, fill #6

## 2022-05-25 NOTE — Telephone Encounter (Signed)
-----   Message from Midge Minium, MD sent at 05/25/2022  7:53 AM EDT ----- Labs look great w/ exception of low Vit D.  Based on this, we need to start 50,000 units weekly x12 weeks in addition to daily OTC supplement of at least 2000 units.

## 2022-05-25 NOTE — Telephone Encounter (Signed)
Informed pt of lab results and sent in Vit D 50,000 units to the pharmacy

## 2022-06-03 ENCOUNTER — Other Ambulatory Visit: Payer: Self-pay | Admitting: Family Medicine

## 2022-06-06 ENCOUNTER — Other Ambulatory Visit (HOSPITAL_COMMUNITY): Payer: Self-pay

## 2022-06-06 MED ORDER — MELOXICAM 15 MG PO TABS
15.0000 mg | ORAL_TABLET | Freq: Every day | ORAL | 3 refills | Status: DC
  Filled 2022-06-06: qty 30, 30d supply, fill #0
  Filled 2022-07-05: qty 30, 30d supply, fill #1
  Filled 2022-08-10: qty 30, 30d supply, fill #2
  Filled 2022-10-19: qty 30, 30d supply, fill #3

## 2022-06-09 ENCOUNTER — Other Ambulatory Visit (HOSPITAL_COMMUNITY): Payer: Self-pay

## 2022-06-09 ENCOUNTER — Other Ambulatory Visit: Payer: Self-pay | Admitting: Family Medicine

## 2022-06-09 MED ORDER — TOPIRAMATE 25 MG PO TABS
25.0000 mg | ORAL_TABLET | Freq: Two times a day (BID) | ORAL | 1 refills | Status: DC
  Filled 2022-06-09: qty 60, 30d supply, fill #0
  Filled 2022-07-10 – 2022-07-18 (×2): qty 60, 30d supply, fill #1

## 2022-06-22 ENCOUNTER — Encounter: Payer: Self-pay | Admitting: Family Medicine

## 2022-06-22 ENCOUNTER — Other Ambulatory Visit: Payer: Self-pay

## 2022-06-22 ENCOUNTER — Telehealth: Payer: Medicaid Other | Admitting: Physician Assistant

## 2022-06-22 ENCOUNTER — Other Ambulatory Visit: Payer: Self-pay | Admitting: Family Medicine

## 2022-06-22 ENCOUNTER — Other Ambulatory Visit (HOSPITAL_COMMUNITY): Payer: Self-pay

## 2022-06-22 DIAGNOSIS — B9689 Other specified bacterial agents as the cause of diseases classified elsewhere: Secondary | ICD-10-CM

## 2022-06-22 DIAGNOSIS — R590 Localized enlarged lymph nodes: Secondary | ICD-10-CM

## 2022-06-22 DIAGNOSIS — J028 Acute pharyngitis due to other specified organisms: Secondary | ICD-10-CM | POA: Diagnosis not present

## 2022-06-22 MED ORDER — SPIRONOLACTONE 100 MG PO TABS
100.0000 mg | ORAL_TABLET | Freq: Every day | ORAL | 3 refills | Status: AC
  Filled 2022-06-22: qty 12, 12d supply, fill #0
  Filled 2022-06-22: qty 18, 18d supply, fill #0
  Filled 2023-02-07: qty 30, 30d supply, fill #0
  Filled 2023-04-13: qty 30, 30d supply, fill #1

## 2022-06-22 MED ORDER — SPIRONOLACTONE 100 MG PO TABS
100.0000 mg | ORAL_TABLET | Freq: Every day | ORAL | 1 refills | Status: DC
  Filled 2022-06-23 – 2022-07-05 (×2): qty 90, 90d supply, fill #0
  Filled 2022-10-19: qty 90, 90d supply, fill #1

## 2022-06-23 ENCOUNTER — Other Ambulatory Visit (HOSPITAL_COMMUNITY): Payer: Self-pay

## 2022-06-23 MED ORDER — AMOXICILLIN 500 MG PO CAPS
500.0000 mg | ORAL_CAPSULE | Freq: Two times a day (BID) | ORAL | 0 refills | Status: AC
  Filled 2022-06-23: qty 20, 10d supply, fill #0

## 2022-06-23 NOTE — Progress Notes (Signed)

## 2022-06-30 ENCOUNTER — Ambulatory Visit
Admission: RE | Admit: 2022-06-30 | Discharge: 2022-06-30 | Disposition: A | Discharge status: Home / Self Care | Payer: Medicaid Other | Source: Ambulatory Visit | Attending: Hematology and Oncology | Admitting: Hematology and Oncology

## 2022-06-30 DIAGNOSIS — Z1239 Encounter for other screening for malignant neoplasm of breast: Secondary | ICD-10-CM

## 2022-07-01 ENCOUNTER — Ambulatory Visit: Payer: Medicaid Other

## 2022-07-01 ENCOUNTER — Other Ambulatory Visit (HOSPITAL_COMMUNITY): Payer: Self-pay

## 2022-07-04 ENCOUNTER — Other Ambulatory Visit: Payer: Self-pay | Admitting: Hematology and Oncology

## 2022-07-04 DIAGNOSIS — R928 Other abnormal and inconclusive findings on diagnostic imaging of breast: Secondary | ICD-10-CM

## 2022-07-05 ENCOUNTER — Other Ambulatory Visit: Payer: Self-pay | Admitting: Family Medicine

## 2022-07-05 DIAGNOSIS — E669 Obesity, unspecified: Secondary | ICD-10-CM

## 2022-07-06 ENCOUNTER — Other Ambulatory Visit (HOSPITAL_COMMUNITY): Payer: Self-pay

## 2022-07-06 ENCOUNTER — Other Ambulatory Visit: Payer: Self-pay

## 2022-07-06 MED ORDER — PHENTERMINE HCL 37.5 MG PO CAPS
37.5000 mg | ORAL_CAPSULE | ORAL | 0 refills | Status: DC
  Filled 2022-07-06 – 2022-07-08 (×3): qty 90, 90d supply, fill #0

## 2022-07-06 NOTE — Telephone Encounter (Signed)
Patient is requesting a refill of the following medications: Requested Prescriptions   Pending Prescriptions Disp Refills   phentermine 37.5 MG capsule 90 capsule 0    Sig: Take 1 capsule (37.5 mg total) by mouth every morning.    Date of patient request: 07/06/2022 Last office visit: 05/23/2022 Date of last refill: 04/11/2022 Last refill amount: 90 Follow up time period per chart: 6 months

## 2022-07-08 ENCOUNTER — Other Ambulatory Visit (HOSPITAL_COMMUNITY): Payer: Self-pay

## 2022-07-13 ENCOUNTER — Ambulatory Visit
Admission: RE | Admit: 2022-07-13 | Discharge: 2022-07-13 | Disposition: A | Discharge status: Home / Self Care | Payer: Commercial Managed Care - PPO | Source: Ambulatory Visit | Attending: Hematology and Oncology | Admitting: Hematology and Oncology

## 2022-07-13 DIAGNOSIS — Z1501 Genetic susceptibility to malignant neoplasm of breast: Secondary | ICD-10-CM | POA: Diagnosis not present

## 2022-07-13 DIAGNOSIS — Z1231 Encounter for screening mammogram for malignant neoplasm of breast: Secondary | ICD-10-CM | POA: Diagnosis not present

## 2022-07-13 DIAGNOSIS — R928 Other abnormal and inconclusive findings on diagnostic imaging of breast: Secondary | ICD-10-CM

## 2022-07-13 DIAGNOSIS — N63 Unspecified lump in unspecified breast: Secondary | ICD-10-CM | POA: Diagnosis not present

## 2022-07-18 ENCOUNTER — Other Ambulatory Visit (HOSPITAL_COMMUNITY): Payer: Self-pay

## 2022-07-24 ENCOUNTER — Other Ambulatory Visit: Payer: Self-pay | Admitting: Family Medicine

## 2022-07-24 ENCOUNTER — Other Ambulatory Visit: Payer: Self-pay | Admitting: Neurology

## 2022-07-24 DIAGNOSIS — J4541 Moderate persistent asthma with (acute) exacerbation: Secondary | ICD-10-CM

## 2022-07-25 ENCOUNTER — Other Ambulatory Visit: Payer: Self-pay

## 2022-07-25 ENCOUNTER — Other Ambulatory Visit (HOSPITAL_COMMUNITY): Payer: Self-pay

## 2022-07-25 MED ORDER — ALBUTEROL SULFATE HFA 108 (90 BASE) MCG/ACT IN AERS
2.0000 | INHALATION_SPRAY | Freq: Four times a day (QID) | RESPIRATORY_TRACT | 6 refills | Status: DC | PRN
Start: 2022-07-25 — End: 2024-01-20
  Filled 2022-07-25: qty 6.7, 25d supply, fill #0
  Filled 2022-10-19: qty 6.7, 25d supply, fill #1
  Filled 2023-02-19: qty 6.7, 25d supply, fill #2

## 2022-07-27 MED ORDER — CYCLOBENZAPRINE HCL 10 MG PO TABS
10.0000 mg | ORAL_TABLET | Freq: Three times a day (TID) | ORAL | 2 refills | Status: DC | PRN
  Filled 2022-07-27: qty 90, 30d supply, fill #0
  Filled 2022-08-31: qty 90, 30d supply, fill #1
  Filled 2022-10-19: qty 90, 30d supply, fill #2

## 2022-07-27 MED ORDER — ONDANSETRON 4 MG PO TBDP
4.0000 mg | ORAL_TABLET | Freq: Three times a day (TID) | ORAL | 3 refills | Status: DC | PRN
  Filled 2022-07-27: qty 30, 5d supply, fill #0
  Filled 2022-08-31: qty 30, 5d supply, fill #1
  Filled 2022-10-19: qty 30, 5d supply, fill #2
  Filled 2022-11-22: qty 30, 5d supply, fill #3

## 2022-07-28 ENCOUNTER — Other Ambulatory Visit (HOSPITAL_COMMUNITY): Payer: Self-pay

## 2022-08-10 ENCOUNTER — Other Ambulatory Visit (HOSPITAL_COMMUNITY): Payer: Self-pay

## 2022-08-10 ENCOUNTER — Other Ambulatory Visit: Payer: Self-pay | Admitting: Family Medicine

## 2022-08-10 MED ORDER — TOPIRAMATE 25 MG PO TABS
25.0000 mg | ORAL_TABLET | Freq: Two times a day (BID) | ORAL | 1 refills | Status: DC
  Filled 2022-08-10 – 2022-08-21 (×5): qty 60, 30d supply, fill #0
  Filled 2022-10-19: qty 60, 30d supply, fill #1

## 2022-08-11 ENCOUNTER — Other Ambulatory Visit: Payer: Self-pay

## 2022-08-11 ENCOUNTER — Other Ambulatory Visit (HOSPITAL_COMMUNITY): Payer: Self-pay

## 2022-08-12 ENCOUNTER — Other Ambulatory Visit (HOSPITAL_COMMUNITY): Payer: Self-pay

## 2022-08-12 ENCOUNTER — Other Ambulatory Visit: Payer: Self-pay

## 2022-08-21 ENCOUNTER — Other Ambulatory Visit: Payer: Self-pay | Admitting: Family Medicine

## 2022-08-22 ENCOUNTER — Other Ambulatory Visit (HOSPITAL_COMMUNITY): Payer: Self-pay

## 2022-08-22 ENCOUNTER — Other Ambulatory Visit: Payer: Self-pay

## 2022-08-22 MED ORDER — ROSUVASTATIN CALCIUM 10 MG PO TABS
10.0000 mg | ORAL_TABLET | Freq: Every day | ORAL | 3 refills | Status: DC
  Filled 2022-08-22: qty 30, 30d supply, fill #0
  Filled 2022-10-19: qty 30, 30d supply, fill #1
  Filled 2022-11-22: qty 30, 30d supply, fill #2
  Filled 2023-01-05: qty 30, 30d supply, fill #3

## 2022-09-01 ENCOUNTER — Other Ambulatory Visit (HOSPITAL_COMMUNITY): Payer: Self-pay

## 2022-09-01 DIAGNOSIS — S0500XA Injury of conjunctiva and corneal abrasion without foreign body, unspecified eye, initial encounter: Secondary | ICD-10-CM | POA: Diagnosis not present

## 2022-09-01 MED ORDER — MOXIFLOXACIN HCL 0.5 % OP SOLN
1.0000 [drp] | Freq: Three times a day (TID) | OPHTHALMIC | 0 refills | Status: DC
  Filled 2022-09-01: qty 3, 20d supply, fill #0

## 2022-09-03 ENCOUNTER — Telehealth: Payer: Commercial Managed Care - PPO | Admitting: Physician Assistant

## 2022-09-03 DIAGNOSIS — J4521 Mild intermittent asthma with (acute) exacerbation: Secondary | ICD-10-CM

## 2022-09-03 MED ORDER — PREDNISONE 20 MG PO TABS: 40.0000 mg | ORAL_TABLET | Freq: Every day | ORAL | 0 refills | Status: DC

## 2022-09-03 NOTE — Progress Notes (Signed)
E-Visit for Asthma  Based on what you have shared with me, it looks like you may have a flare up of your asthma.  Asthma is a chronic (ongoing) lung disease which results in airway obstruction, inflammation and hyper-responsiveness.   Asthma symptoms vary from person to person, with common symptoms including nighttime awakening and decreased ability to participate in normal activities as a result of shortness of breath. It is often triggered by changes in weather, changes in the season, changes in air temperature, or inside (home, school, daycare or work) allergens such as animal dander, mold, mildew, woodstoves or cockroaches.   It can also be triggered by hormonal changes, extreme emotion, physical exertion or an upper respiratory tract illness.     It is important to identify the trigger, and then eliminate or avoid the trigger if possible.   If you have been prescribed medications to be taken on a regular basis, it is important to follow the asthma action plan and to follow guidelines to adjust medication in response to increasing symptoms of decreased peak expiratory flow rate  Treatment: I have prescribed: Prednisone 40mg by mouth per day for 5 - 7 days  HOME CARE Only take medications as instructed by your medical team. Consider wearing a mask or scarf to improve breathing air temperature have been shown to decrease irritation and decrease exacerbations Get rest. Taking a steamy shower or using a humidifier may help nasal congestion sand ease sore throat pain. You can place a towel over your head and breathe in the steam from hot water coming from a faucet. Using a saline nasal spray works much the same way.  Cough drops, hare candies and sore throat lozenges may ease your cough.  Avoid close contacts especially the very you and the elderly Cover your mouth if you cough or  sneeze Always remember to wash your hands.    GET HELP RIGHT AWAY IF: You develop worsening symptoms; breathlessness at rest, drowsy, confused or agitated, unable to speak in full sentences You have coughing fits You develop a severe headache or visual changes You develop shortness of breath, difficulty breathing or start having chest pain Your symptoms persist after you have completed your treatment plan If your symptoms do not improve within 10 days  MAKE SURE YOU Understand these instructions. Will watch your condition. Will get help right away if you are not doing well or get worse.   Your e-visit answers were reviewed by a board certified advanced clinical practitioner to complete your personal care plan, Depending upon the condition, your plan could have included both over the counter or prescription medications.   Please review your pharmacy choice. Your safety is important to us. If you have drug allergies check your prescription carefully.  You can use MyChart to ask questions about today's visit, request a non-urgent  call back, or ask for a work or school excuse for 24 hours related to this e-Visit. If it has been greater than 24 hours you will need to follow up with your provider, or enter a new e-Visit to address those concerns.   You will get an e-mail in the next two days asking about your experience. I hope that your e-visit has been valuable and will speed your recovery. Thank you for using e-visits.   I have spent 5 minutes in review of e-visit questionnaire, review and updating patient chart, medical decision making and response to patient.   Braya Habermehl Z Ward, PA-C    

## 2022-09-06 ENCOUNTER — Telehealth: Payer: Commercial Managed Care - PPO | Admitting: Physician Assistant

## 2022-09-06 ENCOUNTER — Encounter: Payer: Self-pay | Admitting: Family Medicine

## 2022-09-06 ENCOUNTER — Ambulatory Visit
Admission: EM | Admit: 2022-09-06 | Discharge: 2022-09-06 | Disposition: A | Discharge status: Home / Self Care | Payer: Commercial Managed Care - PPO | Attending: Family Medicine | Admitting: Family Medicine

## 2022-09-06 ENCOUNTER — Encounter: Payer: Self-pay | Admitting: Emergency Medicine

## 2022-09-06 ENCOUNTER — Other Ambulatory Visit: Payer: Self-pay

## 2022-09-06 DIAGNOSIS — R0789 Other chest pain: Secondary | ICD-10-CM

## 2022-09-06 DIAGNOSIS — J4541 Moderate persistent asthma with (acute) exacerbation: Secondary | ICD-10-CM | POA: Diagnosis not present

## 2022-09-06 MED ORDER — PREDNISONE 20 MG PO TABS: ORAL_TABLET | ORAL | 0 refills | Status: AC

## 2022-09-06 NOTE — Discharge Instructions (Signed)
Continue current dose of 40 mg prednisone tomorrow.  And then I will taper you as follows 20 mg x 3 days then 10 mg x 2 days.  Continue to use your albuterol inhaler as prescribed..  If you develop any severe difficulty breathing go immediately to the nearest emergency department.

## 2022-09-06 NOTE — ED Provider Notes (Signed)
[facility]    CSN: [id number] Arrival date & time: 09/06/22  1921      History   Chief Complaint Chief Complaint  Patient presents with   Wheezing    Asthma flare - Entered by patient    HPI Michele Mcbride is a 37 y.o. female.   HPI Patient with routinely controlled asthma,  presents today with a ongoing asthma flare after 4 days of prednisone. She is going out of town and will be more than one hour away from hospital and is concerned that this current asthma flare continues to be poorly controlled despite 4 days of prednisone 40 mg. She has been using her albuterol inhaler every 4 hours for wheezing and chest tightness around the clock for the last few days. She has no other URI symptoms. She is not prescribed a LABA or nebulizer treatments as her symptoms are routinely well controlled. She is requesting an extension of her prednisone. Past Medical History:  Diagnosis Date   Anxiety    Asthma    Family history of breast cancer    Gestational diabetes    metformin   Migraine    Monoallelic mutation of CHEK2 gene in female patient    PCOS (polycystic ovarian syndrome)    Sinus infection     Patient Active Problem List   Diagnosis Date Noted   Vitamin D deficiency 05/23/2022   Polyarthralgia 01/30/2022   Low back pain 12/09/2021   Hyperlipidemia 08/03/2021   Lymphadenopathy, submandibular 02/19/2021   Obesity (BMI 30-39.9) 12/18/2020   Metabolic syndrome 12/18/2020   Encounter for induction of labor 05/14/2020   History of gestational diabetes 05/14/2020   Polyhydramnios 05/14/2020   SVD (spontaneous vaginal delivery) 05/14/2020   Postpartum care following vaginal delivery (3/10) 05/14/2020   Breast cancer screening, high risk patient 01/14/2019   At high risk for breast cancer 01/14/2019   Monoallelic mutation of CHEK2 gene in female patient    Physical exam 04/29/2016   Anxiety 07/06/2007    Past Surgical History:  Procedure Laterality Date    BREAST BIOPSY Left 03/20/2019   benign   BREAST BIOPSY Right 03/20/2019   times 2 benign   BREAST LUMPECTOMY WITH RADIOACTIVE SEED LOCALIZATION Bilateral 06/20/2019   Procedure: BILATERAL BREAST LUMPECTOMY WITH RADIOACTIVE SEED LOCALIZATION, Right x 2 and Left x1;  Surgeon: Harriette Bouillon, MD;  Location: [facility];  Service: General;  Laterality: Bilateral;   WISDOM TOOTH EXTRACTION      OB History     Gravida  3   Para  3   Term  3   Preterm      AB      Living  3      SAB      IAB      Ectopic      Multiple  0   Live Births  3            Home Medications    Prior to Admission medications   Medication Sig Start Date End Date Taking? Authorizing Provider  predniSONE (DELTASONE) 20 MG tablet Take 1 tablet (20 mg total) by mouth daily with breakfast for 3 days, THEN 0.5 tablets (10 mg total) daily with breakfast for 2 days. 09/08/22 09/13/22 Yes Bing Neighbors, NP  albuterol (VENTOLIN HFA) 108 (90 Base) MCG/ACT inhaler Inhale 2 puffs into the lungs every 6 hours as needed for wheezing or shortness of breath. 07/25/22   Sheliah Hatch, MD  ALPRAZolam (  XANAX) 0.5 MG tablet Take 1 tablet (0.5 mg total) by mouth 2 (two) times daily as needed for anxiety. 02/19/21   Sheliah Hatch, MD  Biotin 10 MG CAPS Take 10 mg by mouth daily.    [provider]  cyclobenzaprine (FLEXERIL) 10 MG tablet Take 1 tablet (10 mg total) by mouth 3 (three) times daily as needed for muscle spasms. 07/27/22   Anson Fret, MD  DULoxetine (CYMBALTA) 30 MG capsule Take 1 capsule (30 mg total) by mouth daily. 05/23/22   Sheliah Hatch, MD  gabapentin (NEURONTIN) 300 MG capsule Take 1 capsule (300 mg) by mouth 3 times daily. Alternatively may take one in the morning and 2 at bedtime if the pain is worse at night. 05/16/22   Anson Fret, MD  meloxicam (MOBIC) 15 MG tablet Take 1 tablet (15 mg total) by mouth daily. 06/06/22   Sheliah Hatch, MD   methocarbamol (ROBAXIN) 750 MG tablet Take 1 tablet (750 mg total) by mouth every 8 (eight) hours as needed for muscle spasms. 02/24/22   Sheliah Hatch, MD  moxifloxacin (VIGAMOX) 0.5 % ophthalmic solution Place 1 drop into the right eye 3 (three) times daily. 09/01/22     norethindrone (MICRONOR) 0.35 MG tablet Take 1 tablet by mouth daily. 08/23/21     ondansetron (ZOFRAN-ODT) 4 MG disintegrating tablet Dissolve 1-2 tablets (4-8 mg total) by mouth every 8 (eight) hours as needed for nausea. 07/27/22   Anson Fret, MD  phentermine 37.5 MG capsule Take 1 capsule (37.5 mg total) by mouth every morning. 07/06/22   Alveria Apley, NP  rosuvastatin (CRESTOR) 10 MG tablet Take 1 tablet (10 mg total) by mouth daily. 08/22/22   Sheliah Hatch, MD  spironolactone (ALDACTONE) 100 MG tablet Take 1 tablet (100 mg total) by mouth daily. 06/22/22   Sheliah Hatch, MD  spironolactone (ALDACTONE) 100 MG tablet Take 1 tablet (100 mg total) by mouth daily. 06/22/22   Sheliah Hatch, MD  tamoxifen (NOLVADEX) 20 MG tablet Take 1 tablet (20 mg total) by mouth daily. 01/13/22   Serena Croissant, MD  terbinafine (LAMISIL) 250 MG tablet Take 1 tablet (250 mg total) by mouth daily. 04/20/22   Sheliah Hatch, MD  topiramate (TOPAMAX) 25 MG tablet Take 1 tablet (25 mg) by mouth 2 times daily. 08/10/22   Sheliah Hatch, MD  Ubrogepant (UBRELVY) 100 MG TABS Take 1 tablet by mouth at onset of migraine --may repeat in 2 hours as needed. Maximum 2 tablets per day. 04/26/22   Anson Fret, MD  VITAMIN D PO Take 2,000 Units by mouth daily. OTC    [provider]  Vitamin D, Ergocalciferol, (DRISDOL) 1.25 MG (50000 UNIT) CAPS capsule Take 1 capsule (50,000 Units total) by mouth every 7 (seven) days. 05/25/22   Sheliah Hatch, MD    Family History Family History  Problem Relation Age of Onset   Hyperlipidemia Mother    Neuropathy Mother    Migraines Mother    Breast cancer Mother 28    Breast cancer Paternal Aunt    Migraines Maternal Grandmother    Migraines Sister    Migraines Daughter     Social History Social History   Tobacco Use   Smoking status: Never   Smokeless tobacco: Never  Vaping Use   Vaping Use: Never used  Substance Use Topics   Alcohol use: No   Drug use: No     Allergies  Strawberry extract and Latex   Review of Systems Review of Systems   Physical Exam Triage Vital Signs ED Triage Vitals  Enc Vitals Group     BP 09/06/22 1928 128/85     Pulse Rate 09/06/22 1928 (!) 107     Resp 09/06/22 1928 18     Temp 09/06/22 1928 98.6 F (37 C)     Temp Source 09/06/22 1928 Oral     SpO2 09/06/22 1928 96 %     Weight --      Height --      Head Circumference --      Peak Flow --      Pain Score 09/06/22 1929 0     Pain Loc --      Pain Edu? --      Excl. in GC? --    No data found.  Updated Vital Signs BP 128/85 (BP Location: Left Arm)   Pulse (!) 107   Temp 98.6 F (37 C) (Oral)   Resp 18   SpO2 96%   Visual Acuity Right Eye Distance:   Left Eye Distance:   Bilateral Distance:    Right Eye Near:   Left Eye Near:    Bilateral Near:     Physical Exam Vitals reviewed.  Constitutional:      Appearance: Normal appearance.  HENT:     Head: Normocephalic and atraumatic.     Nose: No congestion or rhinorrhea.  Eyes:     Extraocular Movements: Extraocular movements intact.     Conjunctiva/sclera: Conjunctivae normal.     Pupils: Pupils are equal, round, and reactive to light.  Cardiovascular:     Rate and Rhythm: Normal rate and regular rhythm.  Pulmonary:     Breath sounds: Decreased air movement present.  Chest:     Chest wall: Tenderness present.  Musculoskeletal:     Cervical back: Normal range of motion.  Skin:    General: Skin is warm and dry.  Neurological:     General: No focal deficit present.     Mental Status: She is alert and oriented to person, place, and time.      UC Treatments / Results   Labs (all labs ordered are listed, but only abnormal results are displayed) Labs Reviewed - No data to display  EKG   Radiology No results found.  Procedures Procedures (including critical care time)  Medications Ordered in UC Medications - No data to display  Initial Impression / Assessment and Plan / UC Course  I have reviewed the triage vital signs and the nursing notes.  Pertinent labs & imaging results that were available during my care of the patient were reviewed by me and considered in my medical decision making (see chart for details).    Moderate persistent Asthma Acute Exacerbation, continue current prednisone prescription and added the following as a taper: Take 20 mg daily x 3 days then 10 mg daily x 2 days. Patient declined refill of Albuterol reports has sufficient supply of medication at home. Strict ED precautions given if symptoms do not rapidly improve. Final Clinical Impressions(s) / UC Diagnoses   Final diagnoses:  Moderate persistent asthma with acute exacerbation     Discharge Instructions      Continue current dose of 40 mg prednisone tomorrow.  And then I will taper you as follows 20 mg x 3 days then 10 mg x 2 days.  Continue to use your albuterol inhaler as prescribed..  If you develop  any severe difficulty breathing go immediately to the nearest emergency department.    ED Prescriptions     Medication Sig Dispense Auth. Provider   predniSONE (DELTASONE) 20 MG tablet Take 1 tablet (20 mg total) by mouth daily with breakfast for 3 days, THEN 0.5 tablets (10 mg total) daily with breakfast for 2 days. 4 tablet Bing Neighbors, NP      PDMP not reviewed this encounter.   Bing Neighbors, NP 09/11/22 [phone]

## 2022-09-06 NOTE — ED Triage Notes (Signed)
Pt here for asthma sx x 4-5 days that she is taking prednisone for; pt sts not improving as much as she would like and wanted to be rechecked

## 2022-09-06 NOTE — Progress Notes (Signed)
Because you are having ongoing symptoms despite treatment given via e-visit, it is our policy that you must be evaluated in person. As such, I feel your condition warrants further evaluation and I recommend that you be seen in a face to face visit.   NOTE: There will be NO CHARGE for this eVisit   If you are having a true medical emergency please call 911.      For an urgent face to face visit,  has eight urgent care centers for your convenience:   NEW!! [facility] Health Urgent Care Center at [facility] Get Driving Directions [phone] [address], [address] [place], [address]    [facility] Health Urgent Care Center at [facility] Get Driving Directions [phone] [address] [place], [place] [address]   [facility] Health Urgent Care Center [facility]) Get Driving Directions [phone] [address] [place], [place] [address]  [facility] Health Urgent Care Center [facility]) Get Driving Directions [phone] [address] [place],  [place]  [address]  [facility] Health Urgent Care Center [facility] - at [employer]  [phone] [address] [place],  [place] [address]   [facility] Health Urgent Care at [facility] Get Driving Directions [phone] [address] [place], [place] [address]   [facility] Health Urgent Care at [facility] Get Driving Directions  [phone] [address] [place], [place] [address]   [facility] Health Urgent Care at [facility] Directions [phone] [address], [address] [place], [place] [address]  Your MyChart E-visit questionnaire answers were reviewed by a board certified advanced clinical practitioner to complete your personal care plan based on your specific symptoms.  Thank you for using e-Visits.

## 2022-09-19 ENCOUNTER — Telehealth: Payer: Commercial Managed Care - PPO | Admitting: Family Medicine

## 2022-09-19 ENCOUNTER — Other Ambulatory Visit (HOSPITAL_COMMUNITY): Payer: Self-pay

## 2022-09-19 DIAGNOSIS — J019 Acute sinusitis, unspecified: Secondary | ICD-10-CM

## 2022-09-19 DIAGNOSIS — B9689 Other specified bacterial agents as the cause of diseases classified elsewhere: Secondary | ICD-10-CM | POA: Diagnosis not present

## 2022-09-19 MED ORDER — DOXYCYCLINE HYCLATE 100 MG PO TABS
100.0000 mg | ORAL_TABLET | Freq: Two times a day (BID) | ORAL | 0 refills | Status: AC
Start: 2022-09-19 — End: 2022-09-29
  Filled 2022-09-19: qty 20, 10d supply, fill #0

## 2022-09-19 NOTE — Progress Notes (Signed)
E-Visit for Sinus Problems  We are sorry that you are not feeling well.  Here is how we plan to help!  Based on what you have shared with me it looks like you have sinusitis.  Sinusitis is inflammation and infection in the sinus cavities of the head.  Based on your presentation I believe you most likely have Acute Bacterial Sinusitis.  This is an infection caused by bacteria and is treated with antibiotics. I have prescribed Doxycycline 100mg by mouth twice a day for 10 days. You may use an oral decongestant such as Mucinex D or if you have glaucoma or high blood pressure use plain Mucinex. Saline nasal spray help and can safely be used as often as needed for congestion.  If you develop worsening sinus pain, fever or notice severe headache and vision changes, or if symptoms are not better after completion of antibiotic, please schedule an appointment with a health care provider.    Sinus infections are not as easily transmitted as other respiratory infection, however we still recommend that you avoid close contact with loved ones, especially the very young and elderly.  Remember to wash your hands thoroughly throughout the day as this is the number one way to prevent the spread of infection!  Home Care: Only take medications as instructed by your medical team. Complete the entire course of an antibiotic. Do not take these medications with alcohol. A steam or ultrasonic humidifier can help congestion.  You can place a towel over your head and breathe in the steam from hot water coming from a faucet. Avoid close contacts especially the very young and the elderly. Cover your mouth when you cough or sneeze. Always remember to wash your hands.  Get Help Right Away If: You develop worsening fever or sinus pain. You develop a severe head ache or visual changes. Your symptoms persist after you have completed your treatment plan.  Make sure you Understand these instructions. Will watch your  condition. Will get help right away if you are not doing well or get worse.  Thank you for choosing an e-visit.  Your e-visit answers were reviewed by a board certified advanced clinical practitioner to complete your personal care plan. Depending upon the condition, your plan could have included both over the counter or prescription medications.  Please review your pharmacy choice. Make sure the pharmacy is open so you can pick up prescription now. If there is a problem, you may contact your provider through MyChart messaging and have the prescription routed to another pharmacy.  Your safety is important to us. If you have drug allergies check your prescription carefully.   For the next 24 hours you can use MyChart to ask questions about today's visit, request a non-urgent call back, or ask for a work or school excuse. You will get an email in the next two days asking about your experience. I hope that your e-visit has been valuable and will speed your recovery.  I provided 5 minutes of non face-to-face time during this encounter for chart review, medication and order placement, as well as and documentation.   

## 2022-09-22 ENCOUNTER — Other Ambulatory Visit: Payer: Self-pay | Admitting: Oncology

## 2022-09-22 DIAGNOSIS — Z006 Encounter for examination for normal comparison and control in clinical research program: Secondary | ICD-10-CM

## 2022-10-19 ENCOUNTER — Other Ambulatory Visit (HOSPITAL_COMMUNITY): Payer: Self-pay

## 2022-10-19 ENCOUNTER — Other Ambulatory Visit: Payer: Self-pay

## 2022-10-19 ENCOUNTER — Other Ambulatory Visit: Payer: Self-pay | Admitting: Family Medicine

## 2022-10-19 DIAGNOSIS — E669 Obesity, unspecified: Secondary | ICD-10-CM

## 2022-10-20 ENCOUNTER — Other Ambulatory Visit (HOSPITAL_COMMUNITY): Payer: Self-pay

## 2022-10-20 ENCOUNTER — Telehealth: Payer: Self-pay

## 2022-10-20 ENCOUNTER — Other Ambulatory Visit: Payer: Self-pay | Admitting: Family Medicine

## 2022-10-20 DIAGNOSIS — E669 Obesity, unspecified: Secondary | ICD-10-CM

## 2022-10-20 MED ORDER — NORETHINDRONE 0.35 MG PO TABS
1.0000 | ORAL_TABLET | Freq: Every day | ORAL | 11 refills | Status: AC
  Filled 2022-10-20: qty 28, 28d supply, fill #0
  Filled 2022-11-17: qty 28, 28d supply, fill #1
  Filled 2023-01-05: qty 28, 28d supply, fill #2
  Filled 2023-02-07: qty 28, 28d supply, fill #3
  Filled 2023-03-06: qty 28, 28d supply, fill #4
  Filled 2023-04-13: qty 28, 28d supply, fill #5

## 2022-10-20 NOTE — Telephone Encounter (Signed)
Pharmacy Patient Advocate Encounter   Received notification from CoverMyMeds that prior authorization for Ubrelvy 100MG  tablets is required/requested.   Insurance verification completed.   The patient is insured through [facility] .   Per test claim: PA required; PA submitted to [facility] via CoverMyMeds Key/confirmation #/[id number] [facility] Status is pending

## 2022-10-23 DIAGNOSIS — M25562 Pain in left knee: Secondary | ICD-10-CM | POA: Diagnosis not present

## 2022-10-24 ENCOUNTER — Other Ambulatory Visit (HOSPITAL_COMMUNITY): Payer: Self-pay

## 2022-10-24 MED ORDER — DICLOFENAC SODIUM 1 % EX GEL
CUTANEOUS | 3 refills | Status: DC
  Filled 2022-10-24: qty 300, 19d supply, fill #0
  Filled 2023-01-05 – 2023-02-19 (×2): qty 300, 19d supply, fill #1

## 2022-10-24 MED ORDER — PREDNISONE 10 MG PO TABS
ORAL_TABLET | ORAL | 0 refills | Status: AC
  Filled 2022-10-24: qty 21, 6d supply, fill #0

## 2022-10-28 ENCOUNTER — Other Ambulatory Visit (HOSPITAL_COMMUNITY): Payer: Self-pay

## 2022-10-28 NOTE — Telephone Encounter (Signed)
Pharmacy Patient Advocate Encounter  Received notification from [facility] that Prior Authorization for Ubrelvy 100MG  tablets has been APPROVED from 10/21/2022 to 10/20/2023.   PA #/Case ID/Reference #: PA Case ID #: [id number]

## 2022-11-04 ENCOUNTER — Other Ambulatory Visit: Payer: Self-pay

## 2022-11-04 ENCOUNTER — Ambulatory Visit
Admission: RE | Admit: 2022-11-04 | Discharge: 2022-11-04 | Disposition: A | Discharge status: Home / Self Care | Payer: Commercial Managed Care - PPO | Source: Ambulatory Visit | Attending: Physician Assistant | Admitting: Physician Assistant

## 2022-11-04 VITALS — BP 110/80 | HR 128 | Temp 98.8°F | Resp 18

## 2022-11-04 DIAGNOSIS — Z113 Encounter for screening for infections with a predominantly sexual mode of transmission: Secondary | ICD-10-CM | POA: Diagnosis not present

## 2022-11-04 NOTE — ED Triage Notes (Addendum)
Patient presents to [facility] for STD testing. Denies symptoms. Requesting blood work as well.

## 2022-11-05 LAB — RPR: RPR Ser Ql: NONREACTIVE

## 2022-11-05 LAB — HIV ANTIBODY (ROUTINE TESTING W REFLEX): HIV Screen 4th Generation wRfx: NONREACTIVE

## 2022-11-05 NOTE — ED Provider Notes (Signed)
[facility]    CSN: [id number] Arrival date & time: 11/04/22  1459      History   Chief Complaint Chief Complaint  Patient presents with   Exposure to STD    Possible exposure to STD. Would like testing to be on the safe side. - Entered by patient    HPI Michele Mcbride is a 37 y.o. female.   Patient here today for STD screening.  She does not currently have any symptoms.  The history is provided by the patient.  Exposure to STD Pertinent negatives include no abdominal pain.    Past Medical History:  Diagnosis Date   Anxiety    Asthma    Family history of breast cancer    Gestational diabetes    metformin   Migraine    Monoallelic mutation of CHEK2 gene in female patient    PCOS (polycystic ovarian syndrome)    Sinus infection     Patient Active Problem List   Diagnosis Date Noted   Vitamin D deficiency 05/23/2022   Polyarthralgia 01/30/2022   Low back pain 12/09/2021   Hyperlipidemia 08/03/2021   Lymphadenopathy, submandibular 02/19/2021   Obesity (BMI 30-39.9) 12/18/2020   Metabolic syndrome 12/18/2020   Encounter for induction of labor 05/14/2020   History of gestational diabetes 05/14/2020   Polyhydramnios 05/14/2020   SVD (spontaneous vaginal delivery) 05/14/2020   Postpartum care following vaginal delivery (3/10) 05/14/2020   Breast cancer screening, high risk patient 01/14/2019   At high risk for breast cancer 01/14/2019   Monoallelic mutation of CHEK2 gene in female patient    Physical exam 04/29/2016   Anxiety 07/06/2007    Past Surgical History:  Procedure Laterality Date   BREAST BIOPSY Left 03/20/2019   benign   BREAST BIOPSY Right 03/20/2019   times 2 benign   BREAST LUMPECTOMY WITH RADIOACTIVE SEED LOCALIZATION Bilateral 06/20/2019   Procedure: BILATERAL BREAST LUMPECTOMY WITH RADIOACTIVE SEED LOCALIZATION, Right x 2 and Left x1;  Surgeon: Harriette Bouillon, MD;  Location: [facility];  Service: General;   Laterality: Bilateral;   WISDOM TOOTH EXTRACTION      OB History     Gravida  3   Para  3   Term  3   Preterm      AB      Living  3      SAB      IAB      Ectopic      Multiple  0   Live Births  3            Home Medications    Prior to Admission medications   Medication Sig Start Date End Date Taking? Authorizing Provider  albuterol (VENTOLIN HFA) 108 (90 Base) MCG/ACT inhaler Inhale 2 puffs into the lungs every 6 hours as needed for wheezing or shortness of breath. 07/25/22   Sheliah Hatch, MD  ALPRAZolam Prudy Feeler) 0.5 MG tablet Take 1 tablet (0.5 mg total) by mouth 2 (two) times daily as needed for anxiety. 02/19/21   Sheliah Hatch, MD  Biotin 10 MG CAPS Take 10 mg by mouth daily.    [provider]  cyclobenzaprine (FLEXERIL) 10 MG tablet Take 1 tablet (10 mg total) by mouth 3 (three) times daily as needed for muscle spasms. 07/27/22   Anson Fret, MD  diclofenac Sodium (VOLTAREN) 1 % GEL Apply 2-4 gram to affected area four times a day as needed for pain 10/23/22  DULoxetine (CYMBALTA) 30 MG capsule Take 1 capsule (30 mg total) by mouth daily. 05/23/22   Sheliah Hatch, MD  gabapentin (NEURONTIN) 300 MG capsule Take 1 capsule (300 mg) by mouth 3 times daily. Alternatively may take one in the morning and 2 at bedtime if the pain is worse at night. 05/16/22   Anson Fret, MD  meloxicam (MOBIC) 15 MG tablet Take 1 tablet (15 mg total) by mouth daily. 06/06/22   Sheliah Hatch, MD  methocarbamol (ROBAXIN) 750 MG tablet Take 1 tablet (750 mg total) by mouth every 8 (eight) hours as needed for muscle spasms. 02/24/22   Sheliah Hatch, MD  norethindrone (INCASSIA) 0.35 MG tablet Take 1 tablet (0.35 mg total) by mouth daily. 10/20/22     ondansetron (ZOFRAN-ODT) 4 MG disintegrating tablet Dissolve 1-2 tablets (4-8 mg total) by mouth every 8 (eight) hours as needed for nausea. 07/27/22   Anson Fret, MD  phentermine 37.5 MG  capsule Take 1 capsule (37.5 mg total) by mouth every morning. 07/06/22   Alveria Apley, NP  rosuvastatin (CRESTOR) 10 MG tablet Take 1 tablet (10 mg total) by mouth daily. 08/22/22   Sheliah Hatch, MD  spironolactone (ALDACTONE) 100 MG tablet Take 1 tablet (100 mg total) by mouth daily. 06/22/22   Sheliah Hatch, MD  spironolactone (ALDACTONE) 100 MG tablet Take 1 tablet (100 mg total) by mouth daily. 06/22/22   Sheliah Hatch, MD  tamoxifen (NOLVADEX) 20 MG tablet Take 1 tablet (20 mg total) by mouth daily. 01/13/22   Serena Croissant, MD  terbinafine (LAMISIL) 250 MG tablet Take 1 tablet (250 mg total) by mouth daily. 04/20/22   Sheliah Hatch, MD  topiramate (TOPAMAX) 25 MG tablet Take 1 tablet (25 mg) by mouth 2 times daily. 08/10/22   Sheliah Hatch, MD  Ubrogepant (UBRELVY) 100 MG TABS Take 1 tablet by mouth at onset of migraine --may repeat in 2 hours as needed. Maximum 2 tablets per day. 04/26/22   Anson Fret, MD  VITAMIN D PO Take 2,000 Units by mouth daily. OTC    [provider]  Vitamin D, Ergocalciferol, (DRISDOL) 1.25 MG (50000 UNIT) CAPS capsule Take 1 capsule (50,000 Units total) by mouth every 7 (seven) days. 05/25/22   Sheliah Hatch, MD    Family History Family History  Problem Relation Age of Onset   Hyperlipidemia Mother    Neuropathy Mother    Migraines Mother    Breast cancer Mother 50   Breast cancer Paternal Aunt    Migraines Maternal Grandmother    Migraines Sister    Migraines Daughter     Social History Social History   Tobacco Use   Smoking status: Never   Smokeless tobacco: Never  Vaping Use   Vaping status: Never Used  Substance Use Topics   Alcohol use: No   Drug use: No     Allergies   Strawberry extract and Latex   Review of Systems Review of Systems  Constitutional:  Negative for chills and fever.  Eyes:  Negative for discharge and redness.  Gastrointestinal:  Negative for abdominal pain, nausea  and vomiting.  Genitourinary:  Negative for genital sores and vaginal discharge.     Physical Exam Triage Vital Signs ED Triage Vitals  Encounter Vitals Group     BP 11/04/22 1505 110/80     Systolic BP Percentile --      Diastolic BP Percentile --  Pulse Rate 11/04/22 1505 (S) (!) 128     Resp 11/04/22 1505 18     Temp 11/04/22 1505 98.8 F (37.1 C)     Temp Source 11/04/22 1505 Oral     SpO2 11/04/22 1505 96 %     Weight --      Height --      Head Circumference --      Peak Flow --      Pain Score 11/04/22 1508 0     Pain Loc --      Pain Education --      Exclude from Growth Chart --    No data found.  Updated Vital Signs BP 110/80 (BP Location: Left Arm)   Pulse (S) (!) 128 Comment: crying and upset  Temp 98.8 F (37.1 C) (Oral)   Resp 18   SpO2 96%      Physical Exam Vitals and nursing note reviewed.  Constitutional:      General: She is not in acute distress.    Appearance: Normal appearance. She is not ill-appearing.     Comments: Patient is tearful on exam  HENT:     Head: Normocephalic and atraumatic.  Eyes:     Conjunctiva/sclera: Conjunctivae normal.  Cardiovascular:     Rate and Rhythm: Normal rate.  Pulmonary:     Effort: Pulmonary effort is normal. No respiratory distress.  Neurological:     Mental Status: She is alert.  Psychiatric:        Mood and Affect: Mood normal.        Behavior: Behavior normal.        Thought Content: Thought content normal.      UC Treatments / Results  Labs (all labs ordered are listed, but only abnormal results are displayed) Labs Reviewed  RPR   Narrative:    Performed at:  [address], [place], [place]  [id number] Lab Director: Jolene Schimke MD, Phone:  [phone]  HIV ANTIBODY (ROUTINE TESTING W REFLEX)   Narrative:    Performed at:  [address] [place] [address], [place], [place]  [id number] Lab Director: Jolene Schimke MD, Phone:  [phone]  HEPATITIS  PANEL, ACUTE  CERVICOVAGINAL ANCILLARY ONLY    EKG   Radiology No results found.  Procedures Procedures (including critical care time)  Medications Ordered in UC Medications - No data to display  Initial Impression / Assessment and Plan / UC Course  I have reviewed the triage vital signs and the nursing notes.  Pertinent labs & imaging results that were available during my care of the patient were reviewed by me and considered in my medical decision making (see chart for details).    STD screening ordered as requested.  Will await results for further recommendation.  Encouraged follow-up with any further concerns or questions.  Final Clinical Impressions(s) / UC Diagnoses   Final diagnoses:  Screening for STD (sexually transmitted disease)   Discharge Instructions   None    ED Prescriptions   None    PDMP not reviewed this encounter.   Tomi Bamberger, PA-C 11/05/22 [phone]

## 2022-11-09 ENCOUNTER — Other Ambulatory Visit (HOSPITAL_COMMUNITY): Payer: Self-pay

## 2022-11-09 ENCOUNTER — Telehealth: Payer: Self-pay

## 2022-11-09 MED ORDER — FLUCONAZOLE 150 MG PO TABS
150.0000 mg | ORAL_TABLET | Freq: Once | ORAL | 0 refills | Status: AC
  Filled 2022-11-09: qty 2, 2d supply, fill #0

## 2022-11-09 NOTE — Telephone Encounter (Signed)
 Per protocol, pt requires tx with Diflucan. Reviewed with patient, verified pharmacy, prescription sent.

## 2022-11-10 LAB — CERVICOVAGINAL ANCILLARY ONLY
Bacterial Vaginitis (gardnerella): NEGATIVE
Candida Glabrata: NEGATIVE
Candida Vaginitis: POSITIVE — AB
Chlamydia: NEGATIVE
Comment: NEGATIVE
Comment: NEGATIVE
Comment: NEGATIVE
Comment: NEGATIVE
Comment: NEGATIVE
Comment: NORMAL
Neisseria Gonorrhea: NEGATIVE
Trichomonas: NEGATIVE

## 2022-11-10 LAB — SPECIMEN STATUS REPORT

## 2022-11-10 LAB — ACUTE VIRAL HEPATITIS (HAV, HBV, HCV)
HCV Ab: NONREACTIVE
Hep A IgM: NEGATIVE
Hep B C IgM: NEGATIVE
Hepatitis B Surface Ag: NEGATIVE

## 2022-11-10 LAB — HCV INTERPRETATION

## 2022-11-17 ENCOUNTER — Other Ambulatory Visit: Payer: Self-pay

## 2022-11-17 ENCOUNTER — Other Ambulatory Visit: Payer: Self-pay | Admitting: Family Medicine

## 2022-11-17 ENCOUNTER — Other Ambulatory Visit (HOSPITAL_COMMUNITY): Payer: Self-pay

## 2022-11-17 MED ORDER — DULOXETINE HCL 30 MG PO CPEP
30.0000 mg | ORAL_CAPSULE | Freq: Every day | ORAL | 1 refills | Status: DC
  Filled 2022-11-17: qty 90, 90d supply, fill #0
  Filled 2023-03-06: qty 90, 90d supply, fill #1

## 2022-11-17 MED ORDER — TOPIRAMATE 25 MG PO TABS
25.0000 mg | ORAL_TABLET | Freq: Two times a day (BID) | ORAL | 1 refills | Status: DC
  Filled 2022-11-17: qty 60, 30d supply, fill #0
  Filled 2023-01-05: qty 60, 30d supply, fill #1

## 2022-11-22 ENCOUNTER — Other Ambulatory Visit: Payer: Self-pay | Admitting: Family Medicine

## 2022-11-22 ENCOUNTER — Other Ambulatory Visit: Payer: Self-pay

## 2022-11-22 ENCOUNTER — Other Ambulatory Visit (HOSPITAL_COMMUNITY): Payer: Self-pay

## 2022-11-22 MED ORDER — MELOXICAM 15 MG PO TABS
15.0000 mg | ORAL_TABLET | Freq: Every day | ORAL | 3 refills | Status: DC
  Filled 2022-11-22: qty 30, 30d supply, fill #0
  Filled 2023-01-05: qty 30, 30d supply, fill #1
  Filled 2023-02-19: qty 30, 30d supply, fill #2
  Filled 2023-04-13: qty 30, 30d supply, fill #3

## 2022-11-22 NOTE — Telephone Encounter (Signed)
Patient is requesting a refill of the following medications: Requested Prescriptions   Pending Prescriptions Disp Refills   meloxicam (MOBIC) 15 MG tablet 30 tablet 3    Sig: Take 1 tablet (15 mg total) by mouth daily.    Date of patient request: 11/22/2022 Last office visit: 05/23/22 Date of last refill: 06/06/22 Last refill amount: 30 Follow up time period per chart: 6 months

## 2022-11-24 DIAGNOSIS — Z01419 Encounter for gynecological examination (general) (routine) without abnormal findings: Secondary | ICD-10-CM | POA: Diagnosis not present

## 2022-11-24 DIAGNOSIS — R87612 Low grade squamous intraepithelial lesion on cytologic smear of cervix (LGSIL): Secondary | ICD-10-CM | POA: Diagnosis not present

## 2022-11-24 DIAGNOSIS — R87610 Atypical squamous cells of undetermined significance on cytologic smear of cervix (ASC-US): Secondary | ICD-10-CM | POA: Diagnosis not present

## 2022-11-24 DIAGNOSIS — Z8742 Personal history of other diseases of the female genital tract: Secondary | ICD-10-CM | POA: Diagnosis not present

## 2022-11-25 ENCOUNTER — Ambulatory Visit (INDEPENDENT_AMBULATORY_CARE_PROVIDER_SITE_OTHER): Payer: Commercial Managed Care - PPO | Admitting: Family Medicine

## 2022-11-25 ENCOUNTER — Encounter: Payer: Self-pay | Admitting: Family Medicine

## 2022-11-25 VITALS — BP 110/68 | HR 102 | Temp 98.2°F | Ht 62.0 in | Wt 192.8 lb

## 2022-11-25 DIAGNOSIS — E669 Obesity, unspecified: Secondary | ICD-10-CM | POA: Diagnosis not present

## 2022-11-25 DIAGNOSIS — E785 Hyperlipidemia, unspecified: Secondary | ICD-10-CM

## 2022-11-25 NOTE — Patient Instructions (Signed)
Schedule your complete physical in 6 months We'll notify you of your lab results and make any changes if needed Continue to work on healthy diet and regular exercise- you can do it! Call with any questions or concerns Stay safe!  Stay healthy! Happy Fall! Hang in there!

## 2022-11-25 NOTE — Progress Notes (Signed)
Subjective:    Patient ID: Michele Mcbride, female    DOB: [birth year], 37 y.o.   MRN: [id number]  HPI Hyperlipidemia- chronic problem, on Crestor 10mg  daily.  No CP, SOB, abd pain, N/V.  Obesity- pt has gained 15 lbs since last visit.  'i'm at the heaviest I've ever been'.  Has done 3-4 rounds of Prednisone for asthma.  Exercise has been limited due to fall in August.   Review of Systems For ROS see HPI     Objective:   Physical Exam Vitals reviewed.  Constitutional:      General: She is not in acute distress.    Appearance: Normal appearance. She is well-developed. She is obese. She is not ill-appearing.  HENT:     Head: Normocephalic and atraumatic.  Eyes:     Conjunctiva/sclera: Conjunctivae normal.     Pupils: Pupils are equal, round, and reactive to light.  Neck:     Thyroid: No thyromegaly.  Cardiovascular:     Rate and Rhythm: Normal rate and regular rhythm.     Pulses: Normal pulses.     Heart sounds: Normal heart sounds. No murmur heard. Pulmonary:     Effort: Pulmonary effort is normal. No respiratory distress.     Breath sounds: Normal breath sounds.  Abdominal:     General: There is no distension.     Palpations: Abdomen is soft.     Tenderness: There is no abdominal tenderness.  Musculoskeletal:     Cervical back: Normal range of motion and neck supple.     Right lower leg: No edema.     Left lower leg: No edema.  Lymphadenopathy:     Cervical: No cervical adenopathy.  Skin:    General: Skin is warm and dry.  Neurological:     General: No focal deficit present.     Mental Status: She is alert and oriented to person, place, and time.  Psychiatric:        Mood and Affect: Mood normal.        Behavior: Behavior normal.           Assessment & Plan:

## 2022-11-25 NOTE — Assessment & Plan Note (Signed)
Deteriorated.  She has gained 15 lbs since last visit.  She states this is the heaviest she has ever been.  She has had multiple rounds of prednisone recently which has added to the weight gain.  Encouraged low carb diet and regular physical activity.  Will continue to follow.

## 2022-11-25 NOTE — Assessment & Plan Note (Signed)
Chronic problem.  Tolerating Crestor 10mg  daily w/o difficulty.  Encouraged healthy diet and regular exercise.  Check labs.  Adjust meds prn

## 2022-11-27 LAB — HEPATIC FUNCTION PANEL
ALBUMIN/GLOBULIN RATIO: 1.5 (calc) (ref 0.9–2.3)
ALT: 11 U/L (ref 6–29)
AST: 16 U/L (ref 10–30)
Albumin: 4.3 g/dL (ref 3.6–5.1)
Alkaline phosphatase (APISO): 79 U/L (ref 31–125)
Bilirubin, Direct: 0 mg/dL (ref 0.0–0.2)
Globulin: 2.9 g/dL (calc) (ref 2.2–4.0)
Indirect Bilirubin: 0.3 mg/dL (calc) (ref 0.2–1.2)
Total Bilirubin: 0.3 mg/dL (ref 0.2–1.2)
Total Protein: 7.2 g/dL (ref 6.4–8.4)

## 2022-11-27 LAB — BASIC METABOLIC PANEL
BUN/Creatinine Ratio: 13 (calc) (ref 6–22)
BUN: 13 mg/dL (ref 7–25)
CO2: 28 mmol/L (ref 20–32)
Calcium: 9.5 mg/dL (ref 8.6–10.2)
Chloride: 108 mmol/L (ref 98–110)
Creat: 1 mg/dL — ABNORMAL HIGH (ref 0.50–0.97)
Glucose, Bld: 82 mg/dL (ref 65–99)
Potassium: 3.9 mmol/L (ref 3.5–5.3)
Sodium: 143 mmol/L (ref 135–146)

## 2022-11-27 LAB — LIPID PANEL
Cholesterol: 183 mg/dL (ref <200)
HDL: 53 mg/dL (ref >=50)
LDL Cholesterol (Calc): 106 mg/dL (calc) — ABNORMAL HIGH
Non-HDL Cholesterol (Calc): 130 mg/dL (calc) — ABNORMAL HIGH (ref <130)
Total CHOL/HDL Ratio: 3.5 (calc) (ref <5.0)
Triglycerides: 126 mg/dL (ref <150)

## 2022-11-27 LAB — CBC WITH DIFFERENTIAL/PLATELET
Absolute Monocytes: 535 cells/uL (ref 200–950)
Basophils Absolute: 20 cells/uL (ref 0–200)
Basophils Relative: 0.3 %
Eosinophils Absolute: 59 cells/uL (ref 15–500)
Eosinophils Relative: 0.9 %
HCT: 43.2 % (ref 35.0–45.0)
Hemoglobin: 14.3 g/dL (ref 11.7–15.5)
Lymphs Abs: 1861 cells/uL (ref 850–3900)
MCH: 30.6 pg (ref 27.0–33.0)
MCHC: 33.1 g/dL (ref 32.0–36.0)
MCV: 92.5 fL (ref 80.0–100.0)
MPV: 11.5 fL (ref 7.5–12.5)
Monocytes Relative: 8.1 %
Neutro Abs: 4125 cells/uL (ref 1500–7800)
Neutrophils Relative %: 62.5 %
Platelets: 254 10*3/uL (ref 140–400)
RBC: 4.67 10*6/uL (ref 3.80–5.10)
RDW: 12.6 % (ref 11.0–15.0)
Total Lymphocyte: 28.2 %
WBC: 6.6 10*3/uL (ref 3.8–10.8)

## 2022-11-27 LAB — TSH

## 2022-11-29 ENCOUNTER — Encounter: Payer: Self-pay | Admitting: Hematology and Oncology

## 2023-01-05 ENCOUNTER — Other Ambulatory Visit: Payer: Self-pay

## 2023-01-05 ENCOUNTER — Other Ambulatory Visit (HOSPITAL_COMMUNITY): Payer: Self-pay

## 2023-01-05 ENCOUNTER — Encounter: Payer: Self-pay | Admitting: Neurology

## 2023-01-05 MED ORDER — TOPIRAMATE ER 25 MG PO CAP24
1.0000 | ORAL_CAPSULE | Freq: Every day | ORAL | 1 refills | Status: DC
  Filled 2023-01-05: qty 30, 30d supply, fill #0
  Filled 2023-02-07: qty 30, 30d supply, fill #1

## 2023-01-06 ENCOUNTER — Other Ambulatory Visit (HOSPITAL_COMMUNITY): Payer: Self-pay

## 2023-01-06 ENCOUNTER — Ambulatory Visit
Admission: RE | Admit: 2023-01-06 | Discharge: 2023-01-06 | Disposition: A | Discharge status: Home / Self Care | Payer: Commercial Managed Care - PPO | Source: Ambulatory Visit | Attending: Hematology and Oncology

## 2023-01-06 ENCOUNTER — Encounter (HOSPITAL_COMMUNITY): Payer: Self-pay

## 2023-01-06 DIAGNOSIS — R928 Other abnormal and inconclusive findings on diagnostic imaging of breast: Secondary | ICD-10-CM | POA: Diagnosis not present

## 2023-01-06 DIAGNOSIS — Z1502 Genetic susceptibility to malignant neoplasm of ovary: Secondary | ICD-10-CM

## 2023-01-06 MED ORDER — GADOPICLENOL 0.5 MMOL/ML IV SOLN
8.0000 mL | Freq: Once | INTRAVENOUS | Status: AC | PRN
  Administered 2023-01-06: 8 mL via INTRAVENOUS

## 2023-01-09 ENCOUNTER — Telehealth: Payer: Self-pay

## 2023-01-09 ENCOUNTER — Other Ambulatory Visit: Payer: Self-pay | Admitting: Hematology and Oncology

## 2023-01-09 ENCOUNTER — Encounter: Payer: Self-pay | Admitting: Hematology and Oncology

## 2023-01-09 DIAGNOSIS — R928 Other abnormal and inconclusive findings on diagnostic imaging of breast: Secondary | ICD-10-CM

## 2023-01-09 NOTE — Telephone Encounter (Signed)
Placed call to pt and explained process for biopsy, and next steps. Validated concerns regarding possibility of carcinoma and how young she is with a 49 1/38 year old at home. Pt states, "I am terrified and just can't imagine if this is cancer." Once again validated concerns with pt and assured her we would be with her for this regardless of results. She will call or send message with any further concerns.

## 2023-01-12 ENCOUNTER — Ambulatory Visit
Admission: RE | Admit: 2023-01-12 | Discharge: 2023-01-12 | Disposition: A | Discharge status: Home / Self Care | Payer: Commercial Managed Care - PPO | Source: Ambulatory Visit | Attending: Hematology and Oncology | Admitting: Hematology and Oncology

## 2023-01-12 DIAGNOSIS — R928 Other abnormal and inconclusive findings on diagnostic imaging of breast: Secondary | ICD-10-CM

## 2023-01-12 DIAGNOSIS — Z1239 Encounter for other screening for malignant neoplasm of breast: Secondary | ICD-10-CM

## 2023-01-12 DIAGNOSIS — N6324 Unspecified lump in the left breast, lower inner quadrant: Secondary | ICD-10-CM | POA: Diagnosis not present

## 2023-01-12 DIAGNOSIS — N6012 Diffuse cystic mastopathy of left breast: Secondary | ICD-10-CM | POA: Diagnosis not present

## 2023-01-12 MED ORDER — GADOPICLENOL 0.5 MMOL/ML IV SOLN
8.0000 mL | Freq: Once | INTRAVENOUS | Status: AC | PRN
  Administered 2023-01-12: 8 mL via INTRAVENOUS

## 2023-01-13 ENCOUNTER — Other Ambulatory Visit: Payer: Commercial Managed Care - PPO

## 2023-01-13 LAB — SURGICAL PATHOLOGY

## 2023-01-17 ENCOUNTER — Encounter: Payer: Self-pay | Admitting: Diagnostic Radiology

## 2023-01-24 ENCOUNTER — Other Ambulatory Visit (HOSPITAL_COMMUNITY): Payer: Self-pay

## 2023-01-27 ENCOUNTER — Other Ambulatory Visit (HOSPITAL_COMMUNITY): Payer: Medicaid Other | Attending: Oncology

## 2023-02-07 ENCOUNTER — Telehealth: Payer: Commercial Managed Care - PPO | Admitting: Physician Assistant

## 2023-02-07 ENCOUNTER — Other Ambulatory Visit (HOSPITAL_COMMUNITY): Payer: Self-pay

## 2023-02-07 DIAGNOSIS — R3989 Other symptoms and signs involving the genitourinary system: Secondary | ICD-10-CM

## 2023-02-07 MED ORDER — CEPHALEXIN 500 MG PO CAPS
500.0000 mg | ORAL_CAPSULE | Freq: Two times a day (BID) | ORAL | 0 refills | Status: AC
  Filled 2023-02-07: qty 14, 7d supply, fill #0

## 2023-02-07 NOTE — Progress Notes (Signed)

## 2023-02-07 NOTE — Progress Notes (Signed)
I have spent 5 minutes in review of e-visit questionnaire, review and updating patient chart, medical decision making and response to patient.   Mia Milan Cody Jacklynn Dehaas, PA-C    

## 2023-02-10 ENCOUNTER — Other Ambulatory Visit (HOSPITAL_COMMUNITY): Payer: Self-pay

## 2023-02-13 ENCOUNTER — Other Ambulatory Visit (HOSPITAL_COMMUNITY): Payer: Self-pay

## 2023-02-19 ENCOUNTER — Other Ambulatory Visit: Payer: Self-pay | Admitting: Family Medicine

## 2023-02-20 ENCOUNTER — Other Ambulatory Visit (HOSPITAL_COMMUNITY): Payer: Self-pay

## 2023-02-20 MED ORDER — ROSUVASTATIN CALCIUM 10 MG PO TABS
10.0000 mg | ORAL_TABLET | Freq: Every day | ORAL | 3 refills | Status: DC
  Filled 2023-02-20: qty 30, 30d supply, fill #0
  Filled 2023-04-13: qty 30, 30d supply, fill #1

## 2023-02-21 ENCOUNTER — Encounter (HOSPITAL_COMMUNITY): Payer: Self-pay

## 2023-02-21 ENCOUNTER — Other Ambulatory Visit: Payer: Self-pay

## 2023-02-21 ENCOUNTER — Other Ambulatory Visit (HOSPITAL_COMMUNITY): Payer: Self-pay

## 2023-03-06 ENCOUNTER — Other Ambulatory Visit: Payer: Self-pay | Admitting: Family Medicine

## 2023-03-06 ENCOUNTER — Other Ambulatory Visit (HOSPITAL_COMMUNITY): Payer: Self-pay

## 2023-03-06 ENCOUNTER — Other Ambulatory Visit: Payer: Self-pay

## 2023-03-06 ENCOUNTER — Other Ambulatory Visit: Payer: Self-pay | Admitting: Neurology

## 2023-03-06 MED ORDER — METHOCARBAMOL 750 MG PO TABS
750.0000 mg | ORAL_TABLET | Freq: Three times a day (TID) | ORAL | 1 refills | Status: AC | PRN
  Filled 2023-03-06: qty 45, 15d supply, fill #0
  Filled 2023-06-21: qty 45, 15d supply, fill #1

## 2023-03-06 MED ORDER — TOPIRAMATE ER 25 MG PO CAP24
1.0000 | ORAL_CAPSULE | Freq: Every day | ORAL | 1 refills | Status: DC
  Filled 2023-03-06: qty 30, 30d supply, fill #0
  Filled 2023-04-13: qty 30, 30d supply, fill #1

## 2023-03-07 ENCOUNTER — Other Ambulatory Visit (HOSPITAL_COMMUNITY): Payer: Self-pay

## 2023-03-11 ENCOUNTER — Other Ambulatory Visit (HOSPITAL_COMMUNITY): Payer: Self-pay

## 2023-04-03 ENCOUNTER — Other Ambulatory Visit: Payer: Self-pay | Admitting: Hematology and Oncology

## 2023-04-03 DIAGNOSIS — Z1231 Encounter for screening mammogram for malignant neoplasm of breast: Secondary | ICD-10-CM

## 2023-04-03 DIAGNOSIS — Z1501 Genetic susceptibility to malignant neoplasm of breast: Secondary | ICD-10-CM

## 2023-04-12 ENCOUNTER — Telehealth: Payer: Self-pay | Admitting: Family Medicine

## 2023-04-12 NOTE — Telephone Encounter (Signed)
 Patient cancelled an appt through my chart due to already having her physical scheduled. After reviewing her upcoming appt I noticed that it was not scheduled 1 year and 1 day after her last CPE. I contacted patient to make her aware and see if we can get it corrected, patient then stated that this CPE was made at her last visit in September and it was made around her work schedule and she can not take any other time off. I did confirm that she still had the same insurance and that insurance may not cover since it's early so patient is aware.

## 2023-04-13 ENCOUNTER — Other Ambulatory Visit (HOSPITAL_COMMUNITY): Payer: Self-pay

## 2023-04-13 ENCOUNTER — Other Ambulatory Visit: Payer: Self-pay

## 2023-04-13 ENCOUNTER — Encounter: Payer: Self-pay | Admitting: Family Medicine

## 2023-04-14 ENCOUNTER — Other Ambulatory Visit (HOSPITAL_COMMUNITY): Payer: Self-pay

## 2023-04-14 MED ORDER — TERBINAFINE HCL 250 MG PO TABS
250.0000 mg | ORAL_TABLET | Freq: Every day | ORAL | 0 refills | Status: DC
  Filled 2023-04-14: qty 90, 90d supply, fill #0

## 2023-04-15 ENCOUNTER — Other Ambulatory Visit (HOSPITAL_COMMUNITY): Payer: Self-pay

## 2023-04-24 ENCOUNTER — Telehealth: Payer: Commercial Managed Care - PPO | Admitting: Physician Assistant

## 2023-04-24 ENCOUNTER — Other Ambulatory Visit (HOSPITAL_COMMUNITY): Payer: Self-pay

## 2023-04-24 DIAGNOSIS — B9689 Other specified bacterial agents as the cause of diseases classified elsewhere: Secondary | ICD-10-CM | POA: Diagnosis not present

## 2023-04-24 DIAGNOSIS — J019 Acute sinusitis, unspecified: Secondary | ICD-10-CM

## 2023-04-24 MED ORDER — AMOXICILLIN-POT CLAVULANATE 875-125 MG PO TABS
1.0000 | ORAL_TABLET | Freq: Two times a day (BID) | ORAL | 0 refills | Status: DC
  Filled 2023-04-24: qty 14, 7d supply, fill #0

## 2023-04-24 NOTE — Progress Notes (Signed)

## 2023-05-10 ENCOUNTER — Ambulatory Visit: Payer: Medicaid Other | Admitting: Hematology and Oncology

## 2023-05-10 ENCOUNTER — Ambulatory Visit: Payer: Commercial Managed Care - PPO | Admitting: Family Medicine

## 2023-05-12 NOTE — Progress Notes (Signed)
 PATIENT: Michele Mcbride DOB: 1986/01/27  REASON FOR VISIT: follow up HISTORY FROM: patient PRIMARY NEUROLOGIST: Dr. Lucia Mcbride   Chief Complaint  Patient presents with   Follow-up    Patient in room #19 and alone. Patient states she been having increase with her migraines. Patient states every since she had her youngest her migraines has gotten worse.     HISTORY OF PRESENT ILLNESS: Today 05/12/23  Michele Mcbride is a 38 y.o. female who has been followed in this office for migraine headaches. Returns today for follow-up.  She reports that her migraines have started to increase.  She was recently placed back on Trokendi 25 mg at bedtime. Reports 3-4 headaches a week. If she is able to take ubrelvy the headache usually will resolved within 1-2 hours. On occasion will need second dose. On occasion will get a bright light in both visual fields prior to the headache.  Reports some nausea but no vomiting.  Has notices some numbness and tingling in the hands- possible carpal tunnel syndrome-works at a computer all day.  Reports that she only uses gabapentin if she has a flareup of sciatica.  She has Flexeril and occasionally will use it for a migraine or sciatica.  She returns today for an evaluation.    (copied from Dr. Trevor Mcbride note)  HISTORY 05/16/2022: She has had a lot of stress but still doesn't feel like she needs a prevenetative, she has 4 migraine days a month and < 8 total headache days a month. Doing much better. Michele Mcbride helps will jyust give ubrelvy. We already sent in refill os ubrelvy for her she is set there   She is having back pain shoting acorss the Mcbride back and shooting down the leg. She has flares of it sometimes left and sometimes the right. Sciatica. Ongoing for years. Recent flare because she turned wrong. Hurts. She can walk. No changes in bowels or bladder, ongoing 3 days, getting better still painful. She is on a steroid dosepak and helping but takes time. Never tried  gabapentin. Will give gabapentin.    Patient complains of symptoms per HPI as well as the following symptoms: sciatica . Pertinent negatives and positives per HPI. All others negative     Interval history 05/18/2021: Patient returns today for follow-up, have not seen her since 2021 when she was pregnant and had stopped most of her medications.  Prior to that she was on Topamax and Aimovig.  She has had headaches/migraines since the age of 43, started medications at the age of 66 and patient has been to multiple neurologist.  We started her on the CGRP medications and she did extremely well.  She was very happy with Aimovig and Trokendi. She did well the year after pregnancy, she breast fed for 6 months. She has had more stress in the last 3 months and migraines have worsened. She was out of work last Friday for a terrible migraine. In the last 2 weeks she has had 2 migraines, but she has had ncreasing headache frequency every day for 2 weeks, prior to the last 2 weeks it was more like 4-6 total headache days a month. Currently 4 migraine days a month + headaches = between 4-14 total headache days a month. Discussed options, prevention vs acute.   Patient complains of symptoms per HPI as well as the following symptoms: stress . Pertinent negatives and positives per HPI. All others negative     Interval history 10/14/2019; patient is pregnant  and due in March. She had stopped most of her medications including the topamax and aimovig, in fact most medications were stopped in April. She is [redacted] weeks pregnant and have seen the ultrasound, this was a surprise due to her pcos. In July she had several days of headaches, having 5 headache days. We discussed pregnancy and migraines, no medictaion is safe, I gave her literature and we reviewed the possible migraine preventatives and acute medications and risks in pregnancy.   Patient complains of symptoms per HPI as well as the following symptoms: headache, fatigue,  stress, pregnancy . Pertinent negatives and positives per HPI. All others negative     Interval history 10/08/2018: This is a 38 year old patient who is here for follow-up of migraines.  She has had migraine since the age of 77.  Started medications at the age of 59 and been to multiple neurologists.  She has been doing very well on CGRP medications the last year.  She has been under stress in the past.  Aimovig and Trokendi have been very successful.  We did fill out her FMLA forms and we will repeat those for the next year. Lots of stress but different than last year. 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    Treid: axert, maxalt, sumatriptan, amerge worked the best, trig zomig po and nasal, topiramate, amitriptyline and nortriptyline,Topamax currently, propranolol, fioricetl, exapro, zofran, imitrex, axert, relpax, has been on Topamax 200mg , reglan, robaxin, tizanidine, Lexapro   Interval history 12/11/2017: She is under a lot of stress, her parents moved in with her. She supports 5 people. Her ex-husband lost his job and so decreased child support. She is on Aimovig. She does not want to have botox completed. Discussed strategies for stress management. She feels the Aimovig is improving her migraines for now. Will continue on the Trokendi.   HPI:  Michele Mcbride is a 38 y.o. female here as a referral from Dr. Beverely Mcbride for migraines. Suffering since the age of 3. Started medications at the age of 6. She has seen previous neurologists. She wakes with migraines and worse bending over and with valsalva. Can take her out of work for 3 days. She has light sensitivity, sound sensitivity, nausea, vomiting, worse with movement, its unilateral either side may spread to the whole head, pounding throbbing, can be severe. 22 headache days a month, 5 severe migraine days, 5-7 moderately severe migraines, 10 mild migraines or dull headaches. Can worsen with bending over  and valsalva. No aura, + vision changes, no weakness or numbness. No medication overuse. This frequency and severity has been ongoing for a year and intractable. Mother, sister,2 aunts, cousin, grandmother have migraines. She has associated neck pain/ No other focal neurologic deficits, associated symptoms, inciting events or modifiable factors. She has examined food triggers, red wine trigers, garlic, sleeping well and getting enough sleep, exercises. No other focal neurologic deficits, associated symptoms, inciting events or modifiable factors.   Medications tried: Topamax currently, propranolol, fioricetl, exapro, zofran, imitrex, axert, relpax, has been on Topamax 200mg , reglan, robaxin, tizanidine, Lexapro    REVIEW OF SYSTEMS: Out of a complete 14 system review of symptoms, the patient complains only of the following symptoms, and all other reviewed systems are negative.  ALLERGIES: Allergies  Allergen Reactions   Strawberry Extract Anaphylaxis   Latex Rash    Rash and sores    HOME MEDICATIONS: Outpatient Medications Prior to Visit  Medication Sig Dispense Refill  albuterol (VENTOLIN HFA) 108 (90 Base) MCG/ACT inhaler Inhale 2 puffs into the lungs every 6 hours as needed for wheezing or shortness of breath. 6.7 g 6   ALPRAZolam (XANAX) 0.5 MG tablet Take 1 tablet (0.5 mg total) by mouth 2 (two) times daily as needed for anxiety. 30 tablet 1   amoxicillin-clavulanate (AUGMENTIN) 875-125 MG tablet Take 1 tablet by mouth 2 (two) times daily. 14 tablet 0   Biotin 10 MG CAPS Take 10 mg by mouth daily.     cyclobenzaprine (FLEXERIL) 10 MG tablet Take 1 tablet (10 mg total) by mouth 3 (three) times daily as needed for muscle spasms. 90 tablet 2   diclofenac Sodium (VOLTAREN) 1 % GEL Apply 2-4 gram to affected area four times a day as needed for pain 300 g 3   DULoxetine (CYMBALTA) 30 MG capsule Take 1 capsule (30 mg total) by mouth daily. 90 capsule 1   gabapentin (NEURONTIN) 300 MG capsule  Take 1 capsule (300 mg) by mouth 3 times daily. Alternatively may take one in the morning and 2 at bedtime if the pain is worse at night. 90 capsule 11   meloxicam (MOBIC) 15 MG tablet Take 1 tablet (15 mg total) by mouth daily. 30 tablet 3   methocarbamol (ROBAXIN) 750 MG tablet Take 1 tablet (750 mg total) by mouth every 8 (eight) hours as needed for muscle spasms. 45 tablet 1   norethindrone (INCASSIA) 0.35 MG tablet Take 1 tablet (0.35 mg total) by mouth daily. 30 tablet 11   ondansetron (ZOFRAN-ODT) 4 MG disintegrating tablet Dissolve 1-2 tablets (4-8 mg total) by mouth every 8 (eight) hours as needed for nausea. 30 tablet 3   phentermine 37.5 MG capsule Take 1 capsule (37.5 mg total) by mouth every morning. 90 capsule 0   rosuvastatin (CRESTOR) 10 MG tablet Take 1 tablet (10 mg total) by mouth daily. 30 tablet 3   spironolactone (ALDACTONE) 100 MG tablet Take 1 tablet (100 mg total) by mouth daily. 30 tablet 3   spironolactone (ALDACTONE) 100 MG tablet Take 1 tablet (100 mg total) by mouth daily. 90 tablet 1   tamoxifen (NOLVADEX) 20 MG tablet Take 1 tablet (20 mg total) by mouth daily. 90 tablet 3   terbinafine (LAMISIL) 250 MG tablet Take 1 tablet (250 mg total) by mouth daily. 90 tablet 0   Topiramate ER (TROKENDI XR) 25 MG CP24 Take 1 capsule (25 mg total) by mouth daily. 30 capsule 1   Ubrogepant (UBRELVY) 100 MG TABS Take 1 tablet by mouth at onset of migraine --may repeat in 2 hours as needed. Maximum 2 tablets per day. 16 tablet 11   VITAMIN D PO Take 2,000 Units by mouth daily. OTC     Vitamin D, Ergocalciferol, (DRISDOL) 1.25 MG (50000 UNIT) CAPS capsule Take 1 capsule (50,000 Units total) by mouth every 7 (seven) days. 7 capsule 12   No facility-administered medications prior to visit.    PAST MEDICAL HISTORY: Past Medical History:  Diagnosis Date   Anxiety    Asthma    Family history of breast cancer    Gestational diabetes    metformin   Migraine    Monoallelic mutation  of CHEK2 gene in female patient    PCOS (polycystic ovarian syndrome)    Sinus infection     PAST SURGICAL HISTORY: Past Surgical History:  Procedure Laterality Date   BREAST BIOPSY Left 03/20/2019   benign   BREAST BIOPSY Right 03/20/2019   times 2  benign   BREAST LUMPECTOMY WITH RADIOACTIVE SEED LOCALIZATION Bilateral 06/20/2019   Procedure: BILATERAL BREAST LUMPECTOMY WITH RADIOACTIVE SEED LOCALIZATION, Right x 2 and Left x1;  Surgeon: Harriette Bouillon, MD;  Location: [facility];  Service: General;  Laterality: Bilateral;   WISDOM TOOTH EXTRACTION      FAMILY HISTORY: Family History  Problem Relation Age of Onset   Hyperlipidemia Mother    Neuropathy Mother    Migraines Mother    Breast cancer Mother 36   Breast cancer Paternal Aunt    Migraines Maternal Grandmother    Migraines Sister    Migraines Daughter     SOCIAL HISTORY: Social History   Socioeconomic History   Marital status: Divorced    Spouse name: Not on file   Number of children: 2   Years of education: LPN   Highest education level: Not on file  Occupational History   Not on file  Tobacco Use   Smoking status: Never   Smokeless tobacco: Never  Vaping Use   Vaping status: Never Used  Substance and Sexual Activity   Alcohol use: No   Drug use: No   Sexual activity: Yes  Other Topics Concern   Not on file  Social History Narrative   Lives at home with her 2 children   Right handed   Caffeine: 2 cups daily at max   Social Drivers of Health   Financial Resource Strain: Not on file  Food Insecurity: Not on file  Transportation Needs: Not on file  Physical Activity: Not on file  Stress: Not on file  Social Connections: Not on file  Intimate Partner Violence: Not on file      PHYSICAL EXAM  Vitals:   05/15/23 1422  BP: 126/87  Pulse: 95  Weight: 193 lb (87.5 kg)  Height: 5\' 2"  (1.575 m)   Body mass index is 35.3 kg/m.  Generalized: Well developed, in no acute  distress   Neurological examination  Mentation: Alert oriented to time, place, history taking. Follows all commands speech and language fluent Cranial nerve II-XII: Pupils were equal round reactive to light. Extraocular movements were full, visual field were full on confrontational test. Facial sensation and strength were normal. Uvula tongue midline. Head turning and shoulder shrug  were normal and symmetric. Motor: The motor testing reveals 5 over 5 strength of all 4 extremities. Good symmetric motor tone is noted throughout.  Sensory: Sensory testing is intact to soft touch on all 4 extremities. No evidence of extinction is noted.  Coordination: Cerebellar testing reveals good finger-nose-finger and heel-to-shin bilaterally.  Gait and station: Gait is normal.   DIAGNOSTIC DATA (LABS, IMAGING, TESTING) - I reviewed patient records, labs, notes, testing and imaging myself where available.  Lab Results  Component Value Date   WBC 6.6 11/25/2022   HGB 14.3 11/25/2022   HCT 43.2 11/25/2022   MCV 92.5 11/25/2022   PLT 254 11/25/2022      Component Value Date/Time   NA 143 11/25/2022 1459   K 3.9 11/25/2022 1459   CL 108 11/25/2022 1459   CO2 28 11/25/2022 1459   GLUCOSE 82 11/25/2022 1459   BUN 13 11/25/2022 1459   CREATININE 1.00 (H) 11/25/2022 1459   CALCIUM 9.5 11/25/2022 1459   PROT 7.2 11/25/2022 1459   ALBUMIN 4.2 05/23/2022 1438   AST 16 11/25/2022 1459   AST 11 (L) 05/03/2021 1423   ALT 11 11/25/2022 1459   ALT 12 05/03/2021 1423   ALKPHOS 76  05/23/2022 1438   BILITOT 0.3 11/25/2022 1459   BILITOT 0.4 05/03/2021 1423   GFRNONAA >60 05/03/2021 1423   GFRAA >60 11/14/2019 2027   Lab Results  Component Value Date   CHOL 183 11/25/2022   HDL 53 11/25/2022   LDLCALC 106 (H) 11/25/2022   TRIG 126 11/25/2022   CHOLHDL 3.5 11/25/2022   Lab Results  Component Value Date   HGBA1C 5.5 12/18/2020   No results found for: "VITAMINB12" Lab Results  Component Value Date    TSH CANCELED 11/25/2022      ASSESSMENT AND PLAN 38 y.o. year old female  has a past medical history of Anxiety, Asthma, Family history of breast cancer, Gestational diabetes, Migraine, Monoallelic mutation of CHEK2 gene in female patient, PCOS (polycystic ovarian syndrome), and Sinus infection. here with:  1.  Migraine headaches 2.  Bilateral sciatica  -Increase Trokendi to 50 mg at bedtime -Continue Ubrelvy for abortive therapy -Currently only taking gabapentin for sciatica as needed -Continue Flexeril 10 mg 3 times a day as needed for muscle spasms and migraines.     Butch Penny, MSN, NP-C 05/12/2023, 4:11 PM [facility] [address], [address] [place], [place] [address] [phone]

## 2023-05-15 ENCOUNTER — Encounter: Payer: Self-pay | Admitting: Adult Health

## 2023-05-15 ENCOUNTER — Inpatient Hospital Stay: Payer: Medicaid Other | Attending: Hematology and Oncology | Admitting: Hematology and Oncology

## 2023-05-15 ENCOUNTER — Ambulatory Visit: Payer: Commercial Managed Care - PPO | Admitting: Adult Health

## 2023-05-15 ENCOUNTER — Other Ambulatory Visit (HOSPITAL_COMMUNITY): Payer: Self-pay

## 2023-05-15 VITALS — BP 117/76 | HR 86 | Temp 98.6°F | Resp 16 | Ht 62.0 in | Wt 193.8 lb

## 2023-05-15 VITALS — BP 126/87 | HR 95 | Ht 62.0 in | Wt 193.0 lb

## 2023-05-15 DIAGNOSIS — Z1501 Genetic susceptibility to malignant neoplasm of breast: Secondary | ICD-10-CM | POA: Insufficient documentation

## 2023-05-15 DIAGNOSIS — Z1589 Genetic susceptibility to other disease: Secondary | ICD-10-CM

## 2023-05-15 DIAGNOSIS — G43009 Migraine without aura, not intractable, without status migrainosus: Secondary | ICD-10-CM | POA: Diagnosis not present

## 2023-05-15 DIAGNOSIS — Z1502 Genetic susceptibility to malignant neoplasm of ovary: Secondary | ICD-10-CM

## 2023-05-15 DIAGNOSIS — M5432 Sciatica, left side: Secondary | ICD-10-CM

## 2023-05-15 DIAGNOSIS — Z79810 Long term (current) use of selective estrogen receptor modulators (SERMs): Secondary | ICD-10-CM | POA: Insufficient documentation

## 2023-05-15 DIAGNOSIS — Z1509 Genetic susceptibility to other malignant neoplasm: Secondary | ICD-10-CM | POA: Diagnosis not present

## 2023-05-15 DIAGNOSIS — M5431 Sciatica, right side: Secondary | ICD-10-CM | POA: Diagnosis not present

## 2023-05-15 MED ORDER — TAMOXIFEN CITRATE 20 MG PO TABS
20.0000 mg | ORAL_TABLET | Freq: Every day | ORAL | 3 refills | Status: AC
  Filled 2023-05-15: qty 90, 90d supply, fill #0
  Filled 2024-01-17: qty 90, 90d supply, fill #1

## 2023-05-15 MED ORDER — TOPIRAMATE ER 25 MG PO CAP24
2.0000 | ORAL_CAPSULE | Freq: Every day | ORAL | 11 refills | Status: AC
  Filled 2023-05-15: qty 60, 30d supply, fill #0
  Filled 2023-06-21: qty 60, 30d supply, fill #1
  Filled 2023-08-22: qty 60, 30d supply, fill #2
  Filled 2023-09-14 – 2023-10-27 (×3): qty 60, 30d supply, fill #3
  Filled 2023-12-02: qty 60, 30d supply, fill #4
  Filled 2024-01-17: qty 60, 30d supply, fill #5

## 2023-05-15 NOTE — Assessment & Plan Note (Signed)
 Breast cancer surveillance:  1.  Breast MRI 11/03/2020: Benign breast density category B 2. 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 Breast cancer surveillance: Mammogram scheduled for 07/03/2023 Breast MRI 01/13/2023: Left breast biopsies: Benign  Return to clinic in 1 year for follow-up

## 2023-05-15 NOTE — Progress Notes (Signed)
 Patient Care Team: Sheliah Hatch, MD as PCP - General (Family Medicine) Antionette Char, MD as Consulting Physician (Obstetrics and Gynecology) Magrinat, Valentino Hue, MD (Inactive) as Consulting Physician (Oncology) Irena Cords, Enzo Montgomery, MD as Consulting Physician (Allergy and Immunology) Anson Fret, MD as Consulting Physician (Neurology) Antionette Char, MD as Consulting Physician (Obstetrics and Gynecology) Minerva Areola as Counselor (Licensed Clinical Social Worker)  DIAGNOSIS:  Encounter Diagnosis  Name Primary?   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 She had a biopsy in November, which thankfully was benign. She is scheduled for another mammogram and MRI in April. The patient has been on tamoxifen since August 2022, after a break due to pregnancy and nursing. She has been struggling with weight loss, which she attributes to the tamoxifen, steroid use for her asthma, and other factors. The patient is a single mother of three and has financial constraints, which limit her options for weight loss programs.     ALLERGIES:  is allergic to strawberry extract and latex.  MEDICATIONS:  Current Outpatient Medications  Medication Sig Dispense Refill   albuterol (VENTOLIN HFA) 108 (90 Base) MCG/ACT inhaler Inhale 2 puffs into the lungs every 6 hours as needed for wheezing or shortness of breath. 6.7 g 6   Biotin 10 MG CAPS Take 10 mg by mouth daily.     cyclobenzaprine (FLEXERIL) 10 MG tablet Take 1 tablet (10 mg total) by mouth 3 (three) times daily as needed for muscle spasms. 90 tablet 2   DULoxetine (CYMBALTA) 30 MG capsule Take 1 capsule (30 mg total) by  mouth daily. 90 capsule 1   gabapentin (NEURONTIN) 300 MG capsule Take 1 capsule (300 mg) by mouth 3 times daily. Alternatively may take one in the morning and 2 at bedtime if the pain is worse at night. 90 capsule 11   meloxicam (MOBIC) 15 MG tablet Take 1 tablet (15 mg total) by mouth daily. 30 tablet 3   methocarbamol (ROBAXIN) 750 MG tablet Take 1 tablet (750 mg total) by mouth every 8 (eight) hours as needed for muscle spasms. 45 tablet 1   norethindrone (INCASSIA) 0.35 MG tablet Take 1 tablet (0.35 mg total) by mouth daily. 30 tablet 11   ondansetron (ZOFRAN-ODT) 4 MG disintegrating tablet Dissolve 1-2 tablets (4-8 mg total) by mouth every 8 (eight) hours as needed for nausea. 30 tablet 3   rosuvastatin (CRESTOR) 10 MG tablet Take 1 tablet (10 mg total) by mouth daily. 30 tablet 3   spironolactone (ALDACTONE) 100 MG tablet Take 1 tablet (100 mg total) by mouth daily. 30 tablet 3   spironolactone (ALDACTONE) 100 MG tablet Take 1 tablet (100 mg total) by mouth daily. 90 tablet 1   terbinafine (LAMISIL) 250 MG tablet Take 1 tablet (250 mg total) by mouth daily. 90 tablet 0   Topiramate ER (TROKENDI XR) 25 MG CP24 Take 1 capsule (25 mg total) by mouth daily. 30 capsule 1   Ubrogepant (UBRELVY) 100 MG TABS Take 1 tablet by mouth at onset of migraine --may repeat in 2 hours as needed. Maximum 2 tablets per day. 16 tablet 11   VITAMIN D PO Take 2,000 Units by mouth daily. OTC  Vitamin D, Ergocalciferol, (DRISDOL) 1.25 MG (50000 UNIT) CAPS capsule Take 1 capsule (50,000 Units total) by mouth every 7 (seven) days. 7 capsule 12   ALPRAZolam (XANAX) 0.5 MG tablet Take 1 tablet (0.5 mg total) by mouth 2 (two) times daily as needed for anxiety. (Patient not taking: Reported on 05/15/2023) 30 tablet 1   tamoxifen (NOLVADEX) 20 MG tablet Take 1 tablet (20 mg total) by mouth daily. 90 tablet 3   No current facility-administered medications for this visit.    PHYSICAL EXAMINATION: ECOG PERFORMANCE  STATUS: 1 - Symptomatic but completely ambulatory  Vitals:   05/15/23 0926  BP: 117/76  Pulse: 86  Resp: 16  Temp: 98.6 F (37 C)  SpO2: 100%   Filed Weights   05/15/23 0926  Weight: 193 lb 12.8 oz (87.9 kg)    Physical Exam   (exam performed in the presence of a chaperone)  LABORATORY DATA:  I have reviewed the data as listed    Latest Ref Rng & Units 11/25/2022    2:59 PM 05/23/2022    2:38 PM 01/24/2022    2:00 PM  CMP  Glucose 65 - 99 mg/dL 82  98  92   BUN 7 - 25 mg/dL 13  16  10    Creatinine 0.50 - 0.97 mg/dL 1.61  0.96  0.45   Sodium 135 - 146 mmol/L 143  141  144   Potassium 3.5 - 5.3 mmol/L 3.9  4.2  3.8   Chloride 98 - 110 mmol/L 108  103  106   CO2 20 - 32 mmol/L 28  26  30    Calcium 8.6 - 10.2 mg/dL 9.5  9.4  9.1   Total Protein 6.4 - 8.4 g/dL 7.2  6.9  7.1   Total Bilirubin 0.2 - 1.2 mg/dL 0.3  0.2  0.2   Alkaline Phos 39 - 117 U/L  76  68   AST 10 - 30 U/L 16  11  12    ALT 6 - 29 U/L 11  12  9      Lab Results  Component Value Date   WBC 6.6 11/25/2022   HGB 14.3 11/25/2022   HCT 43.2 11/25/2022   MCV 92.5 11/25/2022   PLT 254 11/25/2022   NEUTROABS 4,125 11/25/2022    ASSESSMENT & PLAN:  Monoallelic mutation of CHEK2 gene in female patient Breast cancer surveillance:  1.  Breast MRI 11/03/2020: Benign breast density category B 2. 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 Breast cancer surveillance: Mammogram scheduled for 07/03/2023 Breast MRI: Scheduled for  06/23/2023 01/13/2023: Left breast biopsies: Benign  She has a 62-month follow-up breast MRI coming up in April. She will continue to do breast MRIs in November of each year. Return to clinic in 1 year for follow-up ------------------------------------- Assessment and Plan Assessment & Plan Breast Cancer Surveillance Patient is up to date with mammograms and MRIs. Discussed the current schedule of imaging and the  potential for future transition to contrast-enhanced mammograms once more data is available for mutation-carrying patients. -Continue with current imaging schedule:mammogram in April and MRI in November. -Discuss potential for transition to contrast-enhanced mammograms at next year's visit.  Tamoxifen Therapy Patient has been on tamoxifen since August 2022 with no significant side effects, but reports difficulty with weight loss. -Continue tamoxifen for another 2.5 years (total of 5 years from August 2022). -Sent a year's worth of refills to [facility]. -Suggested discussing metformin with primary care provider as a potential aid for weight loss.  Follow-up No current issues requiring immediate follow-up. -Schedule next visit in one year.      Orders Placed This Encounter  Procedures   MR BREAST BILATERAL W WO CONTRAST INC CAD    Standing Status:   Future    Expected Date:   01/15/2024    Expiration Date:   05/14/2024    If indicated for the ordered procedure, I authorize the administration of contrast media per Radiology protocol:   Yes    What is the patient's sedation requirement?:   No Sedation    Does the patient have a pacemaker or implanted devices?:   No    Preferred imaging location?:   [facility] (table limit-550lbs)   The patient has a good understanding of the overall plan. she agrees with it. she will call with any problems that may develop before the next visit here. Total time spent: 30 mins including face to face time and time spent for  planning, charting and co-ordination of care   Tamsen Meek, MD 05/15/23

## 2023-05-15 NOTE — Patient Instructions (Signed)
 Your Plan:  Increase Trokendi to 50 mg at bedtime Continue Ubrelvy for abortive therapy     Thank you for coming to see [place] at [facility]. I hope we have been able to provide you high quality care today.  You may receive a patient satisfaction survey over the next few weeks. We would appreciate your feedback and comments so that we may continue to improve ourselves and the health of our patients.

## 2023-05-16 ENCOUNTER — Other Ambulatory Visit (HOSPITAL_COMMUNITY): Payer: Self-pay

## 2023-05-19 ENCOUNTER — Encounter: Payer: Commercial Managed Care - PPO | Admitting: Family Medicine

## 2023-05-25 IMAGING — MG MM DIGITAL SCREENING BILAT W/ TOMO AND CAD
8 series · 8 of 24 positions shown · non-contrast
Comparison: Previous exam(s).

CLINICAL DATA: Screening.

EXAM:
DIGITAL SCREENING BILATERAL MAMMOGRAM WITH TOMOSYNTHESIS AND CAD
TECHNIQUE: Bilateral screening digital craniocaudal and mediolateral oblique
mammograms were obtained. Bilateral screening digital breast
tomosynthesis was performed. The images were evaluated with
computer-aided detection.

[L CC synth-2D]
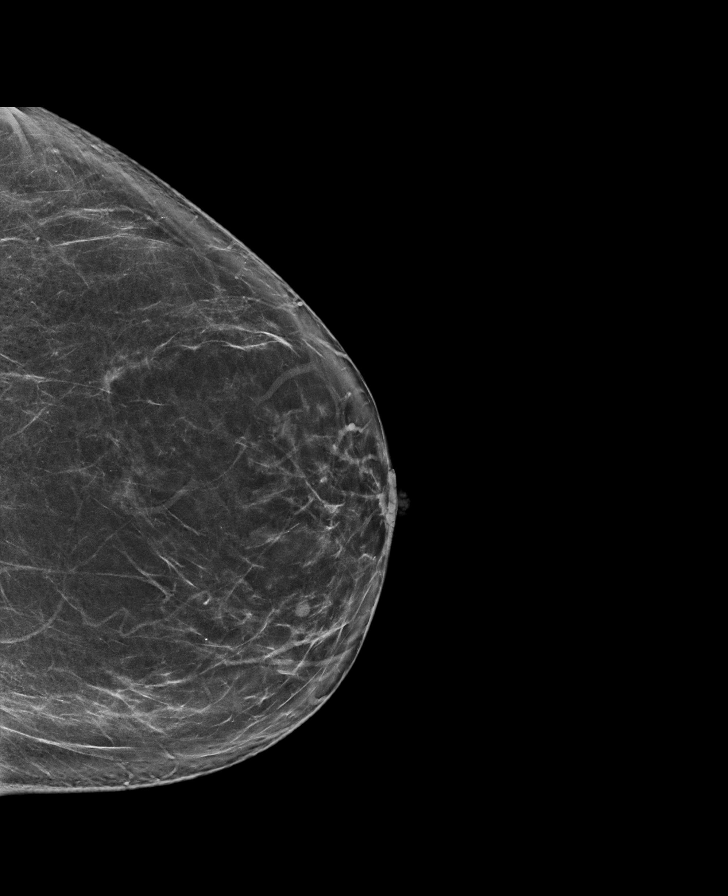

[L MLO synth-2D]
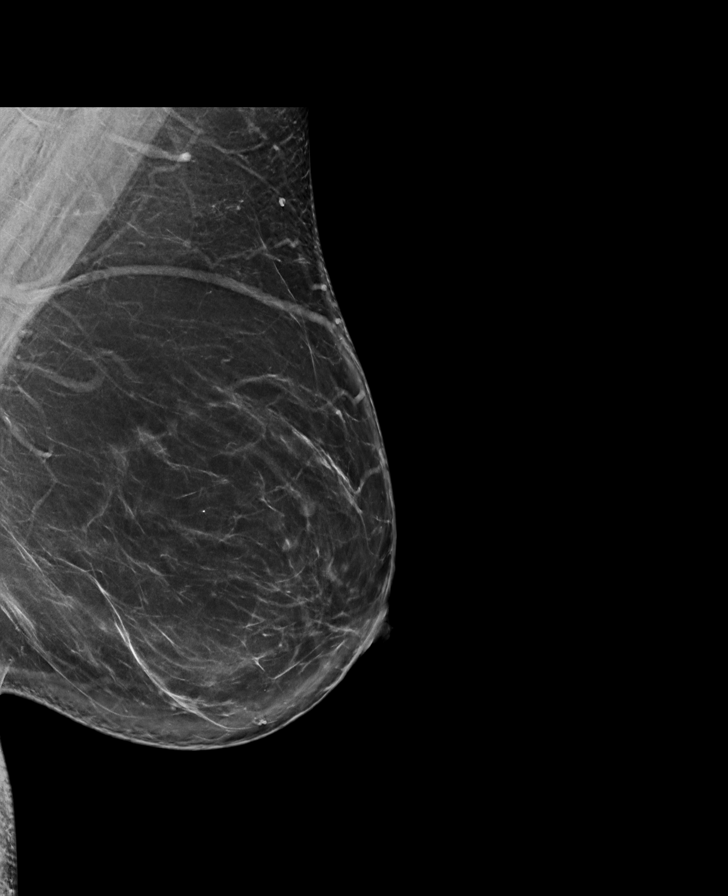

[R CC synth-2D]
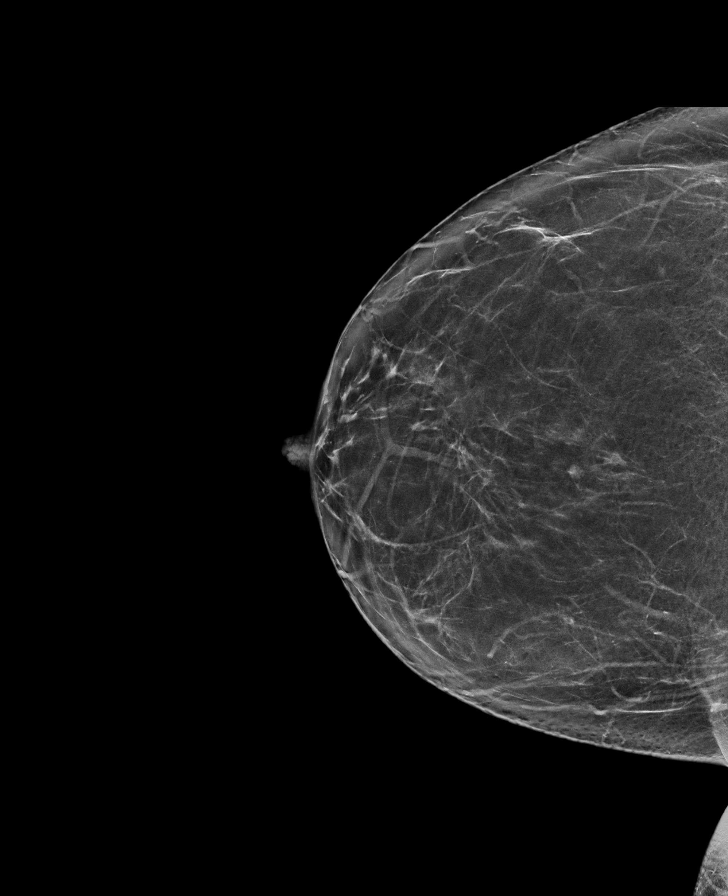

[R MLO synth-2D]
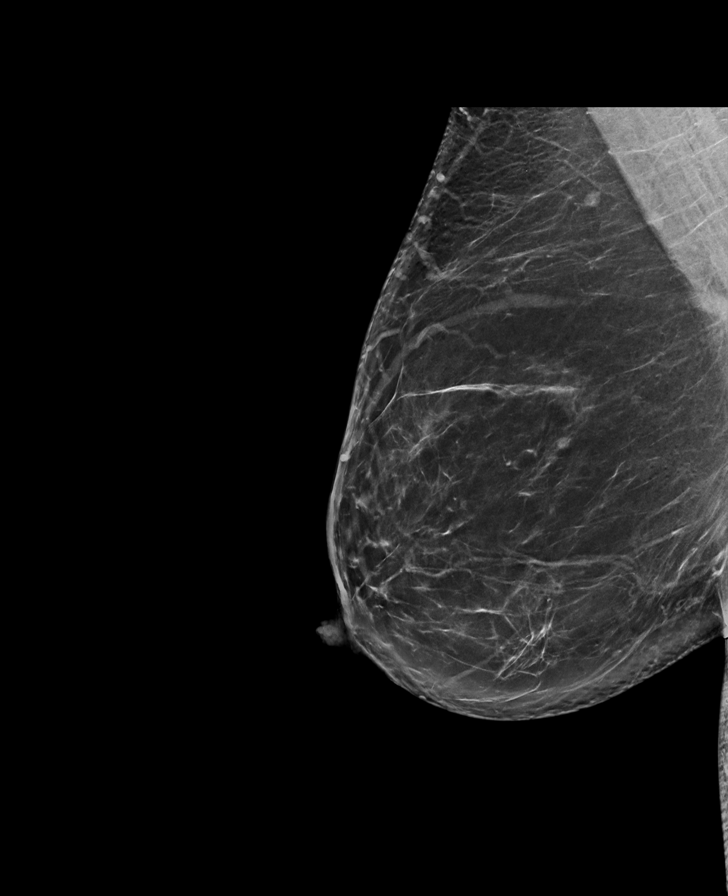

[L CC tomo · tomo slice 41/80.0]
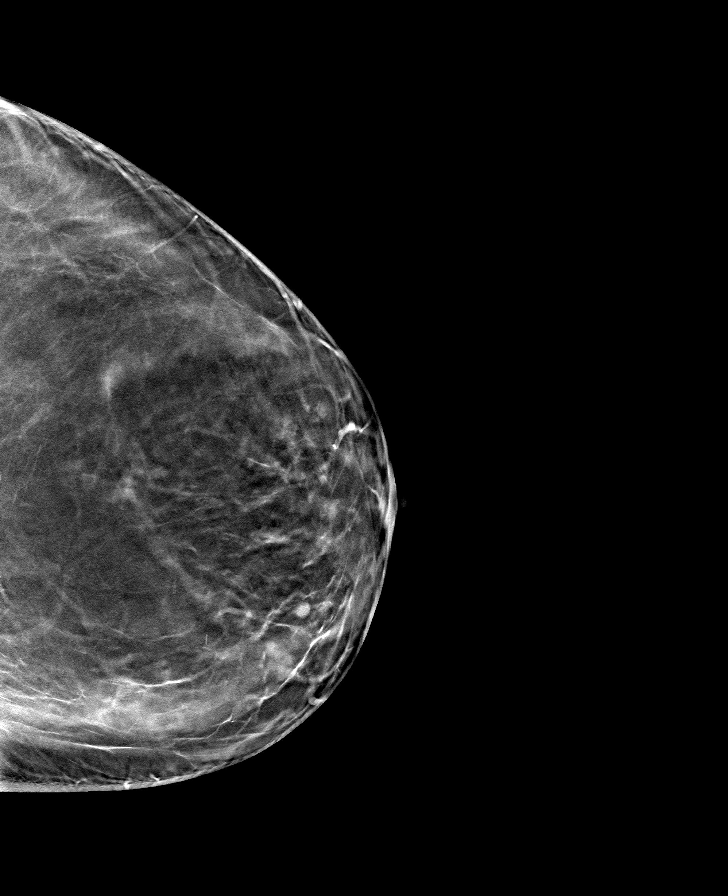

[R MLO tomo · tomo slice 45/88.0]
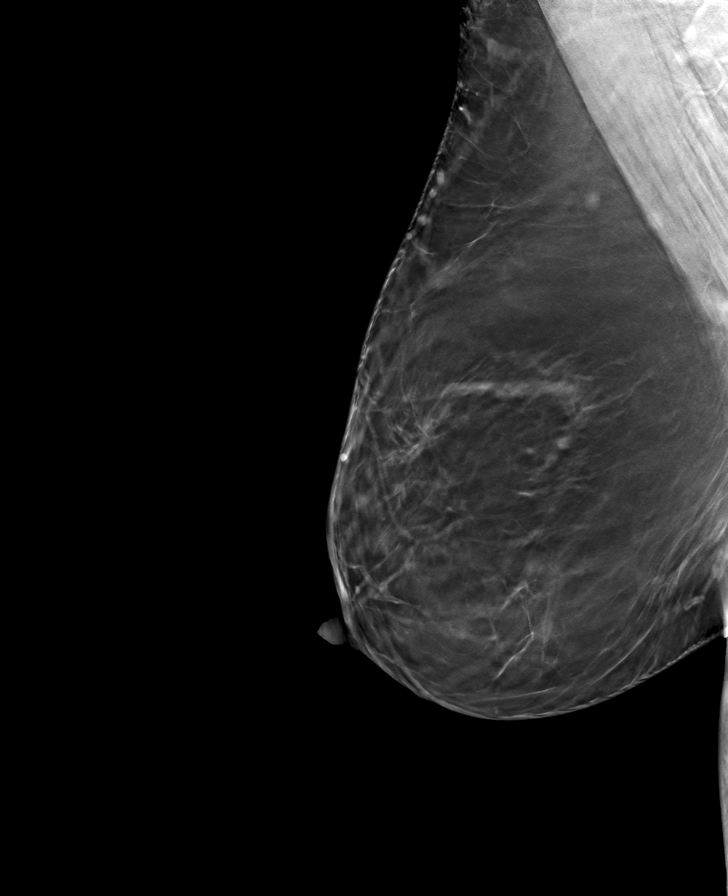

[R CC tomo · tomo slice 37/73.0]
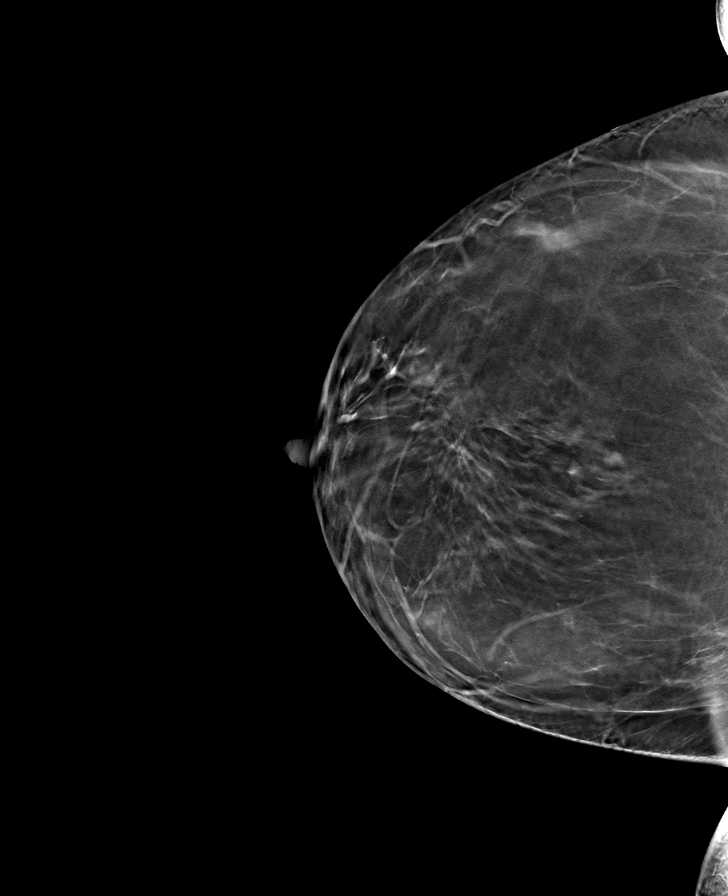

[L MLO tomo · tomo slice 46/91.0]
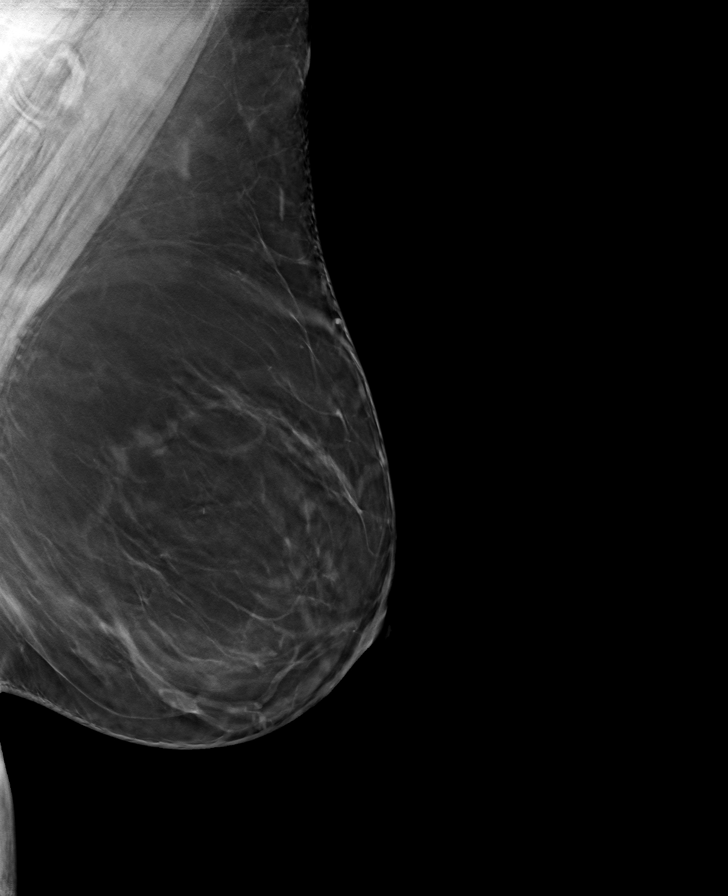

[8 of 24 positions shown; findings below may reference images not displayed]

ACR Breast Density Category b: There are scattered areas of
fibroglandular density.
FINDINGS: There are no findings suspicious for malignancy.
IMPRESSION: No mammographic evidence of malignancy. A result letter of this
screening mammogram will be mailed directly to the patient.

RECOMMENDATION:
Screening mammogram at age 39. (Code:[id number])

BI-RADS CATEGORY  1: Negative.

## 2023-06-05 ENCOUNTER — Other Ambulatory Visit: Payer: Self-pay | Admitting: Neurology

## 2023-06-06 ENCOUNTER — Other Ambulatory Visit: Payer: Self-pay | Admitting: *Deleted

## 2023-06-06 ENCOUNTER — Other Ambulatory Visit (HOSPITAL_COMMUNITY): Payer: Self-pay

## 2023-06-06 MED ORDER — GABAPENTIN 300 MG PO CAPS
300.0000 mg | ORAL_CAPSULE | Freq: Three times a day (TID) | ORAL | 5 refills | Status: AC
  Filled 2023-06-06: qty 90, 30d supply, fill #0
  Filled 2023-08-22: qty 90, 30d supply, fill #1
  Filled 2023-10-27: qty 90, 30d supply, fill #2
  Filled 2023-12-02: qty 90, 30d supply, fill #3
  Filled 2024-01-20: qty 90, 30d supply, fill #4

## 2023-06-06 MED ORDER — UBRELVY 100 MG PO TABS
100.0000 mg | ORAL_TABLET | ORAL | 11 refills | Status: AC | PRN
  Filled 2023-06-06: qty 16, 30d supply, fill #0
  Filled 2023-07-12: qty 16, 30d supply, fill #1
  Filled 2023-08-22: qty 16, 30d supply, fill #2
  Filled 2023-12-02: qty 16, 30d supply, fill #3
  Filled 2024-01-20 – 2024-02-15 (×2): qty 16, 30d supply, fill #4
  Filled 2024-03-21: qty 16, 30d supply, fill #5

## 2023-06-06 MED ORDER — CYCLOBENZAPRINE HCL 10 MG PO TABS
10.0000 mg | ORAL_TABLET | Freq: Three times a day (TID) | ORAL | 2 refills | Status: AC | PRN
  Filled 2023-06-06: qty 90, 30d supply, fill #0

## 2023-06-06 NOTE — Telephone Encounter (Signed)
 Called patient with times available and rescheduled patient from 06/30/2023.

## 2023-06-09 ENCOUNTER — Other Ambulatory Visit (HOSPITAL_COMMUNITY): Payer: Self-pay

## 2023-06-21 ENCOUNTER — Other Ambulatory Visit: Payer: Self-pay | Admitting: Family Medicine

## 2023-06-21 ENCOUNTER — Other Ambulatory Visit (HOSPITAL_COMMUNITY): Payer: Self-pay

## 2023-06-21 MED ORDER — DULOXETINE HCL 30 MG PO CPEP
30.0000 mg | ORAL_CAPSULE | Freq: Every day | ORAL | 1 refills | Status: DC
  Filled 2023-06-21: qty 90, 90d supply, fill #0
  Filled 2023-10-27: qty 90, 90d supply, fill #1

## 2023-06-23 ENCOUNTER — Ambulatory Visit
Admission: RE | Admit: 2023-06-23 | Discharge: 2023-06-23 | Disposition: A | Discharge status: Home / Self Care | Payer: Commercial Managed Care - PPO | Source: Ambulatory Visit | Attending: Hematology and Oncology

## 2023-06-23 DIAGNOSIS — R928 Other abnormal and inconclusive findings on diagnostic imaging of breast: Secondary | ICD-10-CM | POA: Diagnosis not present

## 2023-06-23 DIAGNOSIS — Z803 Family history of malignant neoplasm of breast: Secondary | ICD-10-CM | POA: Diagnosis not present

## 2023-06-23 DIAGNOSIS — Z1501 Genetic susceptibility to malignant neoplasm of breast: Secondary | ICD-10-CM

## 2023-06-23 MED ORDER — GADOPICLENOL 0.5 MMOL/ML IV SOLN
9.0000 mL | Freq: Once | INTRAVENOUS | Status: AC | PRN
  Administered 2023-06-23: 9 mL via INTRAVENOUS

## 2023-06-26 ENCOUNTER — Telehealth: Payer: Self-pay | Admitting: Hematology and Oncology

## 2023-06-26 NOTE — Telephone Encounter (Signed)
 I left a voicemail for the patient that the breast MRI was negative for any concerns.

## 2023-06-30 ENCOUNTER — Encounter: Payer: Commercial Managed Care - PPO | Admitting: Family Medicine

## 2023-07-03 ENCOUNTER — Ambulatory Visit
Admission: RE | Admit: 2023-07-03 | Discharge: 2023-07-03 | Disposition: A | Discharge status: Home / Self Care | Payer: Commercial Managed Care - PPO | Source: Ambulatory Visit | Attending: Hematology and Oncology

## 2023-07-03 DIAGNOSIS — Z1231 Encounter for screening mammogram for malignant neoplasm of breast: Secondary | ICD-10-CM | POA: Diagnosis not present

## 2023-07-10 ENCOUNTER — Telehealth: Admitting: Nurse Practitioner

## 2023-07-10 ENCOUNTER — Other Ambulatory Visit (HOSPITAL_COMMUNITY): Payer: Self-pay

## 2023-07-10 DIAGNOSIS — R3989 Other symptoms and signs involving the genitourinary system: Secondary | ICD-10-CM | POA: Diagnosis not present

## 2023-07-10 MED ORDER — CEPHALEXIN 500 MG PO CAPS
500.0000 mg | ORAL_CAPSULE | Freq: Two times a day (BID) | ORAL | 0 refills | Status: AC
  Filled 2023-07-10: qty 14, 7d supply, fill #0

## 2023-07-10 NOTE — Progress Notes (Signed)

## 2023-07-12 ENCOUNTER — Other Ambulatory Visit (HOSPITAL_COMMUNITY): Payer: Self-pay

## 2023-07-12 ENCOUNTER — Ambulatory Visit: Admitting: Family Medicine

## 2023-07-12 VITALS — BP 128/64 | HR 90 | Temp 97.9°F | Ht 62.0 in | Wt 191.4 lb

## 2023-07-12 DIAGNOSIS — E669 Obesity, unspecified: Secondary | ICD-10-CM | POA: Diagnosis not present

## 2023-07-12 DIAGNOSIS — E785 Hyperlipidemia, unspecified: Secondary | ICD-10-CM

## 2023-07-12 DIAGNOSIS — E559 Vitamin D deficiency, unspecified: Secondary | ICD-10-CM

## 2023-07-12 DIAGNOSIS — Z0000 Encounter for general adult medical examination without abnormal findings: Secondary | ICD-10-CM

## 2023-07-12 LAB — CBC WITH DIFFERENTIAL/PLATELET
Basophils Absolute: 0 10*3/uL (ref 0.0–0.1)
Basophils Relative: 0.4 % (ref 0.0–3.0)
Eosinophils Absolute: 0.1 10*3/uL (ref 0.0–0.7)
Eosinophils Relative: 1.3 % (ref 0.0–5.0)
HCT: 42.9 % (ref 36.0–46.0)
Hemoglobin: 14.3 g/dL (ref 12.0–15.0)
Lymphocytes Relative: 24.3 % (ref 12.0–46.0)
Lymphs Abs: 2.1 10*3/uL (ref 0.7–4.0)
MCHC: 33.4 g/dL (ref 30.0–36.0)
MCV: 92.9 fl (ref 78.0–100.0)
Monocytes Absolute: 0.7 10*3/uL (ref 0.1–1.0)
Monocytes Relative: 8 % (ref 3.0–12.0)
Neutro Abs: 5.7 10*3/uL (ref 1.4–7.7)
Neutrophils Relative %: 66 % (ref 43.0–77.0)
Platelets: 247 10*3/uL (ref 150.0–400.0)
RBC: 4.62 Mil/uL (ref 3.87–5.11)
RDW: 14.1 % (ref 11.5–15.5)
WBC: 8.6 10*3/uL (ref 4.0–10.5)

## 2023-07-12 LAB — BASIC METABOLIC PANEL WITH GFR
BUN: 7 mg/dL (ref 6–23)
CO2: 27 meq/L (ref 19–32)
Calcium: 9.5 mg/dL (ref 8.4–10.5)
Chloride: 106 meq/L (ref 96–112)
Creatinine, Ser: 0.88 mg/dL (ref 0.40–1.20)
GFR: 82.98 mL/min (ref >60.00)
Glucose, Bld: 93 mg/dL (ref 70–99)
Potassium: 3.9 meq/L (ref 3.5–5.1)
Sodium: 141 meq/L (ref 135–145)

## 2023-07-12 LAB — LIPID PANEL
Cholesterol: 236 mg/dL — ABNORMAL HIGH (ref 0–200)
HDL: 39.5 mg/dL (ref >39.00)
LDL Cholesterol: 166 mg/dL — ABNORMAL HIGH (ref 0–99)
NonHDL: 196.38
Total CHOL/HDL Ratio: 6
Triglycerides: 150 mg/dL — ABNORMAL HIGH (ref 0.0–149.0)
VLDL: 30 mg/dL (ref 0.0–40.0)

## 2023-07-12 LAB — HEPATIC FUNCTION PANEL
ALT: 12 U/L (ref 0–35)
AST: 12 U/L (ref 0–37)
Albumin: 4.5 g/dL (ref 3.5–5.2)
Alkaline Phosphatase: 93 U/L (ref 39–117)
Bilirubin, Direct: 0.1 mg/dL (ref 0.0–0.3)
Total Bilirubin: 0.4 mg/dL (ref 0.2–1.2)
Total Protein: 7 g/dL (ref 6.0–8.3)

## 2023-07-12 LAB — TSH: TSH: 1.87 u[IU]/mL (ref 0.35–5.50)

## 2023-07-12 LAB — HEMOGLOBIN A1C: Hgb A1c MFr Bld: 5.9 % (ref 4.6–6.5)

## 2023-07-12 LAB — VITAMIN D 25 HYDROXY (VIT D DEFICIENCY, FRACTURES): VITD: 41.67 ng/mL (ref 30.00–100.00)

## 2023-07-12 MED ORDER — METFORMIN HCL ER 500 MG PO TB24
500.0000 mg | ORAL_TABLET | Freq: Every day | ORAL | 3 refills | Status: DC
  Filled 2023-07-12: qty 30, 30d supply, fill #0
  Filled 2023-08-22: qty 30, 30d supply, fill #1
  Filled 2023-10-27: qty 30, 30d supply, fill #2
  Filled 2023-12-02: qty 30, 30d supply, fill #3

## 2023-07-12 NOTE — Patient Instructions (Signed)
 Follow up in 6 months to recheck cholesterol We'll notify you of your lab results and make any changes if needed Continue to work on healthy diet and regular exercise- you can do it! Call with any questions or concerns Stay Safe!  Stay Healthy!! Happy Mother's Day!!!

## 2023-07-12 NOTE — Progress Notes (Signed)
   Subjective:    Patient ID: Michele Mcbride, female    DOB: 16-Jul-1985, 38 y.o.   MRN: [id number]  HPI CPE- UTD on pap, mammo.  Due for Tdap.  Patient Care Team    Relationship Specialty Notifications Start End  Jess Morita, MD PCP - General Family Medicine  08/03/21   Abdul Hodgkin, MD Consulting Physician Obstetrics and Gynecology  04/29/16   Magrinat, Rozella Cornfield, MD (Inactive) Consulting Physician Oncology  01/14/19   Seabron Cypress, Timmothy Foots, MD Consulting Physician Allergy and Immunology  01/14/19   Glory Larsen, MD Consulting Physician Neurology  01/14/19   Abdul Hodgkin, MD Consulting Physician Obstetrics and Gynecology  01/14/19   Valri Gee T Counselor Licensed Clinical Social Worker  01/14/19      Health Maintenance  Topic Date Due   Pneumococcal Vaccine 58-23 Years old (1 of 2 - PCV) Never done   DTaP/Tdap/Td (2 - Td or Tdap) 12/15/2020   COVID-19 Vaccine (3 - 2024-25 season) 11/06/2022   INFLUENZA VACCINE  10/06/2023   Cervical Cancer Screening (HPV/Pap Cotest)  11/24/2027   Hepatitis C Screening  Completed   HIV Screening  Completed   HPV VACCINES  Aged Out   Meningococcal B Vaccine  Aged Out     Review of Systems Patient reports no vision/ hearing changes, adenopathy,fever, weight change,  persistant/recurrent hoarseness , swallowing issues, chest pain, palpitations, edema, persistant/recurrent cough, hemoptysis, dyspnea (rest/exertional/paroxysmal nocturnal), gastrointestinal bleeding (melena, rectal bleeding), abdominal pain, significant heartburn, bowel changes, GU symptoms (dysuria, hematuria, incontinence), Gyn symptoms (abnormal  bleeding, pain),  syncope, focal weakness, memory loss, skin/hair/nail changes, abnormal bruising or bleeding, anxiety, or depression.   + numbness of hands intermittently    Objective:   Physical Exam General Appearance:    Alert, cooperative, no distress, appears stated age  Head:    Normocephalic, without obvious  abnormality, atraumatic  Eyes:    PERRL, conjunctiva/corneas clear, EOM's intact both eyes  Ears:    Normal TM's and external ear canals, both ears  Nose:   Nares normal, septum midline, mucosa normal, no drainage    or sinus tenderness  Throat:   Lips, mucosa, and tongue normal; teeth and gums normal  Neck:   Supple, symmetrical, trachea midline, no adenopathy;    Thyroid : no enlargement/tenderness/nodules  Back:     Symmetric, no curvature, ROM normal, no CVA tenderness  Lungs:     Clear to auscultation bilaterally, respirations unlabored  Chest Wall:    No tenderness or deformity   Heart:    Regular rate and rhythm, S1 and S2 normal, no murmur, rub   or gallop  Breast Exam:    Deferred to GYN  Abdomen:     Soft, non-tender, bowel sounds active all four quadrants,    no masses, no organomegaly  Genitalia:    Deferred to GYN  Rectal:    Extremities:   Extremities normal, atraumatic, no cyanosis or edema  Pulses:   2+ and symmetric all extremities  Skin:   Skin color, texture, turgor normal, no rashes or lesions  Lymph nodes:   Cervical, supraclavicular, and axillary nodes normal  Neurologic:   CNII-XII intact, normal strength, sensation and reflexes    throughout          Assessment & Plan:

## 2023-07-13 ENCOUNTER — Encounter: Payer: Self-pay | Admitting: Family Medicine

## 2023-07-13 ENCOUNTER — Telehealth: Payer: Self-pay

## 2023-07-13 NOTE — Telephone Encounter (Signed)
-----   Message from Laymon Priest sent at 07/13/2023  7:42 AM EDT ----- Your total cholesterol and LDL are both much higher than last check.  Please make sure you are taking your Crestor  daily while working on healthy diet and regular exercise.  Remainder of labs are stable and look good!

## 2023-07-13 NOTE — Telephone Encounter (Signed)
 Pt has reviewed via MyChart

## 2023-08-05 NOTE — Assessment & Plan Note (Signed)
 Pt's PE WNL.  UTD on pap, mammo.  Due for Tdap pt feels she got this at [employer] [facility].  Will check.  Check labs.  Anticipatory guidance provided.

## 2023-08-22 ENCOUNTER — Other Ambulatory Visit (HOSPITAL_COMMUNITY): Payer: Self-pay

## 2023-08-22 ENCOUNTER — Telehealth: Admitting: Physician Assistant

## 2023-08-22 ENCOUNTER — Other Ambulatory Visit: Payer: Self-pay

## 2023-08-22 DIAGNOSIS — R112 Nausea with vomiting, unspecified: Secondary | ICD-10-CM

## 2023-08-22 MED ORDER — PROMETHAZINE HCL 25 MG PO TABS
25.0000 mg | ORAL_TABLET | Freq: Two times a day (BID) | ORAL | 0 refills | Status: DC | PRN
  Filled 2023-08-22: qty 10, 5d supply, fill #0

## 2023-08-22 NOTE — Progress Notes (Signed)
 I have spent 5 minutes in review of e-visit questionnaire, review and updating patient chart, medical decision making and response to patient.   Piedad Climes, PA-C

## 2023-08-22 NOTE — Progress Notes (Signed)
 E-Visit for Vomiting  We are sorry that you are not feeling well. Here is how we plan to help!  Vomiting is the forceful emptying of a portion of the stomach's content through the mouth.  Although nausea and vomiting can make you feel miserable, it's important to remember that these are not diseases, but rather symptoms of an underlying illness.  When we treat short term symptoms, we always caution that any symptoms that persist should be fully evaluated in a medical office.  I have prescribed a medication that will help alleviate your symptoms and allow you to stay hydrated:  Promethazine  25 mg take 1 tablet twice daily  HOME CARE: Drink clear liquids.  This is very important! Dehydration (the lack of fluid) can lead to a serious complication.  Start off with 1 tablespoon every 5 minutes for 8 hours. You may begin eating bland foods after 8 hours without vomiting.  Start with saltine crackers, white bread, rice, mashed potatoes, applesauce. After 48 hours on a bland diet, you may resume a normal diet. Try to go to sleep.  Sleep often empties the stomach and relieves the need to vomit.  GET HELP RIGHT AWAY IF:  Your symptoms do not improve or worsen within 2 days after treatment. You have a fever for over 3 days. You cannot keep down fluids after trying the medication.  MAKE SURE YOU:  Understand these instructions. Will watch your condition. Will get help right away if you are not doing well or get worse.   Thank you for choosing an e-visit.  Your e-visit answers were reviewed by a board certified advanced clinical practitioner to complete your personal care plan. Depending upon the condition, your plan could have included both over the counter or prescription medications.  Please review your pharmacy choice. Make sure the pharmacy is open so you can pick up prescription now. If there is a problem, you may contact your provider through [employer] and have the prescription  routed to another pharmacy.  Your safety is important to us . If you have drug allergies check your prescription carefully.   For the next 24 hours you can use MyChart to ask questions about today's visit, request a non-urgent call back, or ask for a work or school excuse. You will get an email in the next two days asking about your experience. I hope that your e-visit has been valuable and will speed your recovery.

## 2023-09-05 ENCOUNTER — Other Ambulatory Visit (HOSPITAL_COMMUNITY): Payer: Self-pay

## 2023-09-05 ENCOUNTER — Telehealth: Admitting: Physician Assistant

## 2023-09-05 DIAGNOSIS — J4521 Mild intermittent asthma with (acute) exacerbation: Secondary | ICD-10-CM

## 2023-09-05 MED ORDER — BENZONATATE 100 MG PO CAPS
100.0000 mg | ORAL_CAPSULE | Freq: Three times a day (TID) | ORAL | 0 refills | Status: DC | PRN
  Filled 2023-09-05: qty 30, 10d supply, fill #0

## 2023-09-05 MED ORDER — PREDNISONE 20 MG PO TABS
40.0000 mg | ORAL_TABLET | Freq: Every day | ORAL | 0 refills | Status: DC
  Filled 2023-09-05: qty 10, 5d supply, fill #0

## 2023-09-05 NOTE — Progress Notes (Signed)
 E-Visit for Asthma  Based on what you have shared with me, it looks like you may have a flare up of your asthma.  Asthma is a chronic (ongoing) lung disease which results in airway obstruction, inflammation and hyper-responsiveness.   Asthma symptoms vary from person to person, with common symptoms including nighttime awakening and decreased ability to participate in normal activities as a result of shortness of breath. It is often triggered by changes in weather, changes in the season, changes in air temperature, or inside (home, school, daycare or work) allergens such as animal dander, mold, mildew, woodstoves or cockroaches.   It can also be triggered by hormonal changes, extreme emotion, physical exertion or an upper respiratory tract illness.     It is important to identify the trigger, and then eliminate or avoid the trigger if possible.   If you have been prescribed medications to be taken on a regular basis, it is important to follow the asthma action plan and to follow guidelines to adjust medication in response to increasing symptoms of decreased peak expiratory flow rate  Treatment: I have prescribed: Prednisone  40mg  by mouth per day for 5 days. Please continue your albuterol  inhaler as directed, when needed. I have also prescribed Tessalon  to help with the symptom of cough. You can continue OTC Delsym along with this.  HOME CARE Only take medications as instructed by your medical team. Consider wearing a mask or scarf to improve breathing air temperature have been shown to decrease irritation and decrease exacerbations Get rest. Taking a steamy shower or using a humidifier may help nasal congestion sand ease sore throat pain. You can place a towel over your head and breathe in the steam from hot water coming from a faucet. Using a saline nasal spray works much the same way.   Cough drops, hare candies and sore throat lozenges may ease your cough.  Avoid close contacts especially the very you and the elderly Cover your mouth if you cough or sneeze Always remember to wash your hands.    GET HELP RIGHT AWAY IF: You develop worsening symptoms; breathlessness at rest, drowsy, confused or agitated, unable to speak in full sentences You have coughing fits You develop a severe headache or visual changes You develop shortness of breath, difficulty breathing or start having chest pain Your symptoms persist after you have completed your treatment plan If your symptoms do not improve within 10 days  MAKE SURE YOU Understand these instructions. Will watch your condition. Will get help right away if you are not doing well or get worse.   Your e-visit answers were reviewed by a board certified advanced clinical practitioner to complete your personal care plan, Depending upon the condition, your plan could have included both over the counter or prescription medications.   Please review your pharmacy choice. Your safety is important to us . If you have drug allergies check your prescription carefully.  You can use MyChart to ask questions about today's visit, request a non-urgent  call back, or ask for a work or school excuse for 24 hours related to this e-Visit. If it has been greater than 24 hours you will need to follow up with your provider, or enter a new e-Visit to address those concerns.   You will get an e-mail in the next two days asking about your experience. I hope that your e-visit has been valuable and will speed your recovery. Thank you for using e-visits.

## 2023-09-05 NOTE — Progress Notes (Signed)
 I have spent 5 minutes in review of e-visit questionnaire, review and updating patient chart, medical decision making and response to patient.   Piedad Climes, PA-C

## 2023-09-14 ENCOUNTER — Other Ambulatory Visit (HOSPITAL_COMMUNITY): Payer: Self-pay

## 2023-09-14 ENCOUNTER — Other Ambulatory Visit: Payer: Self-pay | Admitting: Neurology

## 2023-09-15 ENCOUNTER — Other Ambulatory Visit (HOSPITAL_COMMUNITY): Payer: Self-pay

## 2023-09-15 ENCOUNTER — Other Ambulatory Visit: Payer: Self-pay

## 2023-09-15 MED ORDER — ONDANSETRON 4 MG PO TBDP
4.0000 mg | ORAL_TABLET | Freq: Three times a day (TID) | ORAL | 3 refills | Status: AC | PRN
  Filled 2023-09-15 – 2024-03-21 (×2): qty 30, 5d supply, fill #0

## 2023-09-18 ENCOUNTER — Other Ambulatory Visit: Payer: Self-pay

## 2023-09-21 ENCOUNTER — Encounter

## 2023-09-25 ENCOUNTER — Other Ambulatory Visit (HOSPITAL_COMMUNITY): Payer: Self-pay

## 2023-10-09 ENCOUNTER — Telehealth: Payer: Self-pay | Admitting: Pharmacist

## 2023-10-09 NOTE — Telephone Encounter (Signed)
 Pharmacy Patient Advocate Encounter   Received notification from CoverMyMeds that prior authorization for Ubrelvy  100MG  tablets is required/requested.   Insurance verification completed.   The patient is insured through [employer] .   Per test claim: PA required; PA submitted to above mentioned insurance via CoverMyMeds Key/confirmation #/EOC [id number] Status is pending

## 2023-10-09 NOTE — Telephone Encounter (Signed)
 Pharmacy Patient Advocate Encounter  Received notification from [facility] that Prior Authorization for Ubrelvy  100MG  tablets has been APPROVED from 10/09/2023 to 10/08/2024   PA #/Case ID/Reference #: [id number]

## 2023-10-25 ENCOUNTER — Telehealth: Admitting: Physician Assistant

## 2023-10-25 ENCOUNTER — Other Ambulatory Visit (HOSPITAL_COMMUNITY): Payer: Self-pay

## 2023-10-25 DIAGNOSIS — N390 Urinary tract infection, site not specified: Secondary | ICD-10-CM

## 2023-10-25 MED ORDER — NITROFURANTOIN MONOHYD MACRO 100 MG PO CAPS
100.0000 mg | ORAL_CAPSULE | Freq: Two times a day (BID) | ORAL | 0 refills | Status: AC
  Filled 2023-10-25: qty 10, 5d supply, fill #0

## 2023-10-25 NOTE — Progress Notes (Signed)
 I have spent 5 minutes in review of e-visit questionnaire, review and updating patient chart, medical decision making and response to patient.   Shawnda Mauney, PA-C

## 2023-10-25 NOTE — Progress Notes (Signed)

## 2023-11-30 ENCOUNTER — Other Ambulatory Visit (HOSPITAL_COMMUNITY): Payer: Self-pay

## 2023-11-30 ENCOUNTER — Telehealth: Admitting: Family

## 2023-11-30 DIAGNOSIS — J4521 Mild intermittent asthma with (acute) exacerbation: Secondary | ICD-10-CM

## 2023-11-30 MED ORDER — PREDNISONE 20 MG PO TABS
40.0000 mg | ORAL_TABLET | Freq: Every day | ORAL | 0 refills | Status: AC
  Filled 2023-11-30: qty 10, 5d supply, fill #0

## 2023-11-30 NOTE — Progress Notes (Signed)
 E Visit for Asthma  Based on what you have shared with me, it looks like you may have a flare up of your asthma.  Asthma is a chronic (ongoing) lung disease which results in airway obstruction, inflammation and hyper-responsiveness.   Asthma symptoms vary from person to person, with common symptoms including nighttime awakening and decreased ability to participate in normal activities as a result of shortness of breath. It is often triggered by changes in weather, changes in the season, changes in air temperature, or inside (home, school, daycare or work) allergens such as animal dander, mold, mildew, woodstoves or cockroaches.   It can also be triggered by hormonal changes, extreme emotion, physical exertion or an upper respiratory tract illness.     It is important to identify the trigger, and then eliminate or avoid the trigger if possible.   If you have been prescribed medications to be taken on a regular basis, it is important to follow the asthma action plan and to follow guidelines to adjust medication in response to increasing symptoms of decreased peak expiratory flow rate  Treatment: I have prescribed: Prednisone  40mg  by mouth per day for 5  days  HOME CARE Only take medications as instructed by your medical team. Consider wearing a mask or scarf to improve breathing air temperature have been shown to decrease irritation and decrease exacerbations Get rest. Taking a steamy shower or using a humidifier may help nasal congestion sand ease sore throat pain. You can place a towel over your head and breathe in the steam from hot water coming from a faucet. Using a saline nasal spray works much the same way.  Cough drops, hare candies and sore throat lozenges may ease your cough.  Avoid close contacts especially the very you and the elderly Cover your mouth if you cough or  sneeze Always remember to wash your hands.    GET HELP RIGHT AWAY IF: You develop worsening symptoms; breathlessness at rest, drowsy, confused or agitated, unable to speak in full sentences You have coughing fits You develop a severe headache or visual changes You develop shortness of breath, difficulty breathing or start having chest pain Your symptoms persist after you have completed your treatment plan If your symptoms do not improve within 10 days  MAKE SURE YOU Understand these instructions. Will watch your condition. Will get help right away if you are not doing well or get worse.   Your e-visit answers were reviewed by a board certified advanced clinical practitioner to complete your personal care plan, Depending upon the condition, your plan could have included both over the counter or prescription medications.  Please review your pharmacy choice. Your safety is important to us . If you have drug allergies check your prescription carefully. You can use MyChart to ask questions about today's visit, request a non-urgent call back, or ask for a work or school excuse for 24 hours related to this e-Visit. If it has been greater than 24 hours you will need to follow up with your provider, or enter a new e-Visit to address those concerns.  You will get an e-mail in the next two days asking about your experience. I hope that your e-visit has been valuable and will speed your recovery. Thank you for using e-visits.  I have spent 5 minutes in review of e-visit questionnaire, review and updating patient chart, medical decision making and response to patient.   Bari Learn, FNP

## 2023-12-04 ENCOUNTER — Other Ambulatory Visit (HOSPITAL_COMMUNITY): Payer: Self-pay

## 2023-12-04 ENCOUNTER — Other Ambulatory Visit: Payer: Self-pay

## 2023-12-07 ENCOUNTER — Telehealth: Admitting: Physician Assistant

## 2023-12-07 DIAGNOSIS — J4521 Mild intermittent asthma with (acute) exacerbation: Secondary | ICD-10-CM

## 2023-12-07 NOTE — Progress Notes (Signed)
  Because of ongoing symptoms despite recent treatment via e-visit with course of steroids, I feel your condition warrants further evaluation and I recommend that you be seen in a face-to-face visit.   NOTE: There will be NO CHARGE for this E-Visit   If you are having a true medical emergency, please call 911.     For an urgent face to face visit, [facility] has multiple urgent care centers for your convenience.  Click the link below for the full list of locations and hours, walk-in wait times, appointment scheduling options and driving directions:  [facility]gent [facility] - [place], [place], [place], [place], [place], [place]  [place]     Your MyChart E-visit questionnaire answers were reviewed by a board certified advanced clinical practitioner to complete your personal care plan based on your specific symptoms.    Thank you for using e-Visits.

## 2023-12-11 ENCOUNTER — Ambulatory Visit
Admission: RE | Admit: 2023-12-11 | Discharge: 2023-12-11 | Disposition: A | Source: Ambulatory Visit | Attending: Family Medicine | Admitting: Family Medicine

## 2023-12-11 ENCOUNTER — Other Ambulatory Visit (HOSPITAL_COMMUNITY): Payer: Self-pay

## 2023-12-11 ENCOUNTER — Ambulatory Visit: Admitting: Family Medicine

## 2023-12-11 VITALS — BP 122/82 | HR 122 | Temp 98.0°F | Ht 62.0 in | Wt 186.0 lb

## 2023-12-11 DIAGNOSIS — R051 Acute cough: Secondary | ICD-10-CM

## 2023-12-11 DIAGNOSIS — R062 Wheezing: Secondary | ICD-10-CM | POA: Diagnosis not present

## 2023-12-11 DIAGNOSIS — R059 Cough, unspecified: Secondary | ICD-10-CM | POA: Diagnosis not present

## 2023-12-11 MED ORDER — FLUTICASONE PROPIONATE HFA 110 MCG/ACT IN AERO
1.0000 | INHALATION_SPRAY | Freq: Two times a day (BID) | RESPIRATORY_TRACT | 12 refills | Status: AC
  Filled 2023-12-11: qty 12, 60d supply, fill #0
  Filled 2024-01-20 – 2024-02-04 (×2): qty 12, 60d supply, fill #1

## 2023-12-11 MED ORDER — PREDNISONE 10 MG PO TABS
ORAL_TABLET | ORAL | 0 refills | Status: DC
  Filled 2023-12-11: qty 18, 9d supply, fill #0

## 2023-12-11 NOTE — Progress Notes (Signed)
   Subjective:    Patient ID: Michele Mcbride, female    DOB: 1986-02-06, 38 y.o.   MRN: [id number]  HPI Cough- pt had an Evisit on 9/25 and was dx'd w/ asthma exacerbation and tx'd w/ Prednisone  40mg  x5 days.  This helped temporarily but as soon as the steroids were done, sxs returned.  Tried to have a 2nd Evisit on 10/2 but was told she needed to be seen in person since the steroids had not improved her sxs.  She is currently on Albuterol  prn.  Sxs initially began ~9/22.  No fever, no body aches, chills.  Cough was initially productive but now it's not.  Using Mucinex .  + wheezing.  Does not feel sick- just coughing and wheezing.     Review of Systems For ROS see HPI     Objective:   Physical Exam Vitals reviewed.  Constitutional:      General: She is not in acute distress.    Appearance: Normal appearance. She is not ill-appearing.  HENT:     Head: Normocephalic and atraumatic.     Right Ear: Tympanic membrane and ear canal normal.     Left Ear: Tympanic membrane and ear canal normal.     Nose: Nose normal. No congestion or rhinorrhea.     Mouth/Throat:     Mouth: Mucous membranes are moist.     Pharynx: No oropharyngeal exudate or posterior oropharyngeal erythema.  Eyes:     Extraocular Movements: Extraocular movements intact.     Conjunctiva/sclera: Conjunctivae normal.  Cardiovascular:     Rate and Rhythm: Normal rate and regular rhythm.     Pulses: Normal pulses.  Pulmonary:     Effort: Pulmonary effort is normal. No respiratory distress.     Breath sounds: No wheezing or rhonchi.     Comments: + hacking cough Musculoskeletal:     Cervical back: Neck supple.  Lymphadenopathy:     Cervical: No cervical adenopathy.  Neurological:     General: No focal deficit present.     Mental Status: She is alert and oriented to person, place, and time.  Psychiatric:        Mood and Affect: Mood normal.        Behavior: Behavior normal.        Thought Content: Thought content  normal.           Assessment & Plan:  Cough- new to provider, ongoing for pt.  Already tx'd w/ Prednisone .  Will get CXR to assess.  Will repeat course of Prednisone , add ICS.  Hold on abx until CXR results available.  Pt expressed understanding and is in agreement w/ plan.

## 2023-12-11 NOTE — Patient Instructions (Signed)
 Follow up as needed or as scheduled Go get your xray at [facility] START the Prednisone  as directed- 3 pills at the same time x3 days, then 2 pills at the same time x3 days, then 1 pill daily.  Take w/ food  ADD the Flovent  inhaler twice daily until feeling better Continue the albuterol  Continue the Xyzal  daily Call with any questions or concerns Hang in there!!!

## 2023-12-12 ENCOUNTER — Encounter: Payer: Self-pay | Admitting: Family Medicine

## 2023-12-15 ENCOUNTER — Ambulatory Visit: Payer: Self-pay | Admitting: Family Medicine

## 2023-12-17 ENCOUNTER — Other Ambulatory Visit: Payer: Self-pay | Admitting: Medical Genetics

## 2023-12-17 DIAGNOSIS — Z006 Encounter for examination for normal comparison and control in clinical research program: Secondary | ICD-10-CM

## 2023-12-18 ENCOUNTER — Other Ambulatory Visit (HOSPITAL_COMMUNITY): Payer: Self-pay

## 2023-12-18 MED ORDER — AZITHROMYCIN 250 MG PO TABS
ORAL_TABLET | ORAL | 0 refills | Status: DC
  Filled 2023-12-18: qty 6, 5d supply, fill #0

## 2023-12-18 NOTE — Telephone Encounter (Signed)
 Patient is stating she is still having a persistent cough even with taking the tessalon  pearls. Patient states this cough is interfering with work and sleep and is wondering if the Zpack was still an option?

## 2023-12-19 ENCOUNTER — Other Ambulatory Visit (HOSPITAL_COMMUNITY): Payer: Self-pay

## 2023-12-19 MED ORDER — PROMETHAZINE-DM 6.25-15 MG/5ML PO SYRP
5.0000 mL | ORAL_SOLUTION | Freq: Four times a day (QID) | ORAL | 0 refills | Status: DC | PRN
  Filled 2023-12-19: qty 140, 7d supply, fill #0
  Filled 2023-12-19: qty 40, 2d supply, fill #0

## 2023-12-19 NOTE — Addendum Note (Signed)
 Addended by: Weston Fulco E on: 12/19/2023 08:08 AM   Modules accepted: Orders

## 2024-01-17 ENCOUNTER — Other Ambulatory Visit: Payer: Self-pay | Admitting: Family Medicine

## 2024-01-18 ENCOUNTER — Other Ambulatory Visit (HOSPITAL_COMMUNITY): Payer: Self-pay

## 2024-01-18 ENCOUNTER — Other Ambulatory Visit: Payer: Self-pay

## 2024-01-18 MED ORDER — METFORMIN HCL ER 500 MG PO TB24
500.0000 mg | ORAL_TABLET | Freq: Every day | ORAL | 3 refills | Status: AC
  Filled 2024-01-18: qty 30, 30d supply, fill #0

## 2024-01-19 ENCOUNTER — Ambulatory Visit: Admitting: Family Medicine

## 2024-01-19 ENCOUNTER — Encounter: Payer: Self-pay | Admitting: Family Medicine

## 2024-01-19 ENCOUNTER — Other Ambulatory Visit (HOSPITAL_COMMUNITY): Payer: Self-pay

## 2024-01-19 VITALS — BP 108/60 | HR 110 | Temp 97.9°F | Resp 16 | Ht 62.0 in | Wt 185.2 lb

## 2024-01-19 DIAGNOSIS — Z23 Encounter for immunization: Secondary | ICD-10-CM

## 2024-01-19 DIAGNOSIS — E669 Obesity, unspecified: Secondary | ICD-10-CM

## 2024-01-19 DIAGNOSIS — E785 Hyperlipidemia, unspecified: Secondary | ICD-10-CM

## 2024-01-19 LAB — CBC WITH DIFFERENTIAL/PLATELET
Basophils Absolute: 0 K/uL (ref 0.0–0.1)
Basophils Relative: 0.3 % (ref 0.0–3.0)
Eosinophils Absolute: 0 K/uL (ref 0.0–0.7)
Eosinophils Relative: 0.3 % (ref 0.0–5.0)
HCT: 43.8 % (ref 36.0–46.0)
Hemoglobin: 15 g/dL (ref 12.0–15.0)
Lymphocytes Relative: 15 % (ref 12.0–46.0)
Lymphs Abs: 1.6 K/uL (ref 0.7–4.0)
MCHC: 34.2 g/dL (ref 30.0–36.0)
MCV: 91.1 fl (ref 78.0–100.0)
Monocytes Absolute: 0.6 K/uL (ref 0.1–1.0)
Monocytes Relative: 5.9 % (ref 3.0–12.0)
Neutro Abs: 8.5 K/uL — ABNORMAL HIGH (ref 1.4–7.7)
Neutrophils Relative %: 78.5 % — ABNORMAL HIGH (ref 43.0–77.0)
Platelets: 267 K/uL (ref 150.0–400.0)
RBC: 4.8 Mil/uL (ref 3.87–5.11)
RDW: 13.8 % (ref 11.5–15.5)
WBC: 10.8 K/uL — ABNORMAL HIGH (ref 4.0–10.5)

## 2024-01-19 LAB — LIPID PANEL
Cholesterol: 198 mg/dL (ref 0–200)
HDL: 46 mg/dL (ref >39.00)
LDL Cholesterol: 122 mg/dL — ABNORMAL HIGH (ref 0–99)
NonHDL: 151.98
Total CHOL/HDL Ratio: 4
Triglycerides: 149 mg/dL (ref 0.0–149.0)
VLDL: 29.8 mg/dL (ref 0.0–40.0)

## 2024-01-19 LAB — HEPATIC FUNCTION PANEL
ALT: 13 U/L (ref 0–35)
AST: 12 U/L (ref 0–37)
Albumin: 4.3 g/dL (ref 3.5–5.2)
Alkaline Phosphatase: 97 U/L (ref 39–117)
Bilirubin, Direct: 0 mg/dL (ref 0.0–0.3)
Total Bilirubin: 0.5 mg/dL (ref 0.2–1.2)
Total Protein: 7.1 g/dL (ref 6.0–8.3)

## 2024-01-19 LAB — BASIC METABOLIC PANEL WITH GFR
BUN: 10 mg/dL (ref 6–23)
CO2: 28 meq/L (ref 19–32)
Calcium: 9.2 mg/dL (ref 8.4–10.5)
Chloride: 104 meq/L (ref 96–112)
Creatinine, Ser: 0.93 mg/dL (ref 0.40–1.20)
GFR: 77.37 mL/min (ref >60.00)
Glucose, Bld: 101 mg/dL — ABNORMAL HIGH (ref 70–99)
Potassium: 3.3 meq/L — ABNORMAL LOW (ref 3.5–5.1)
Sodium: 139 meq/L (ref 135–145)

## 2024-01-19 LAB — TSH: TSH: 1.12 u[IU]/mL (ref 0.35–5.50)

## 2024-01-19 MED ORDER — PROMETHAZINE-DM 6.25-15 MG/5ML PO SYRP
5.0000 mL | ORAL_SOLUTION | Freq: Four times a day (QID) | ORAL | 0 refills | Status: AC | PRN
  Filled 2024-01-19: qty 180, 9d supply, fill #0

## 2024-01-19 NOTE — Assessment & Plan Note (Signed)
Chronic problem.  Currently on Crestor 10mg daily w/o difficulty.  Check labs.  Adjust meds prn  

## 2024-01-19 NOTE — Assessment & Plan Note (Signed)
 Ongoing issue.  Weight is stable, BMI stable.  Encouraged healthy diet and regular exercise.  Will follow.

## 2024-01-19 NOTE — Patient Instructions (Signed)
 Schedule your complete physical in 6 months We'll notify you of your lab results and make any changes if needed Continue to work on healthy diet and regular exercise- you can do it! Call with any questions or concerns Stay Safe!  Stay Healthy! Happy Holidays!!

## 2024-01-19 NOTE — Progress Notes (Signed)
   Subjective:    Patient ID: Michele Mcbride, female    DOB: 04-18-1985, 38 y.o.   MRN: [id number]  HPI Hyperlipidemia- chronic problem, on Crestor  10mg  daily.  No CP, SOB, abd pain, N/V.    Obesity- weight is stable, BMI 33.88   Review of Systems For ROS see HPI     Objective:   Physical Exam Vitals reviewed.  Constitutional:      General: She is not in acute distress.    Appearance: Normal appearance. She is well-developed. She is obese. She is not ill-appearing.  HENT:     Head: Normocephalic and atraumatic.  Eyes:     Conjunctiva/sclera: Conjunctivae normal.     Pupils: Pupils are equal, round, and reactive to light.  Neck:     Thyroid : No thyromegaly.  Cardiovascular:     Rate and Rhythm: Normal rate and regular rhythm.     Pulses: Normal pulses.     Heart sounds: Normal heart sounds. No murmur heard. Pulmonary:     Effort: Pulmonary effort is normal. No respiratory distress.     Breath sounds: Normal breath sounds.  Abdominal:     General: There is no distension.     Palpations: Abdomen is soft.     Tenderness: There is no abdominal tenderness.  Musculoskeletal:     Cervical back: Normal range of motion and neck supple.     Right lower leg: No edema.     Left lower leg: No edema.  Lymphadenopathy:     Cervical: No cervical adenopathy.  Skin:    General: Skin is warm and dry.  Neurological:     General: No focal deficit present.     Mental Status: She is alert and oriented to person, place, and time.  Psychiatric:        Mood and Affect: Mood normal.        Behavior: Behavior normal.        Thought Content: Thought content normal.           Assessment & Plan:

## 2024-01-20 ENCOUNTER — Other Ambulatory Visit: Payer: Self-pay | Admitting: Family Medicine

## 2024-01-20 ENCOUNTER — Other Ambulatory Visit (HOSPITAL_COMMUNITY): Payer: Self-pay

## 2024-01-20 DIAGNOSIS — J4541 Moderate persistent asthma with (acute) exacerbation: Secondary | ICD-10-CM

## 2024-01-20 DIAGNOSIS — E785 Hyperlipidemia, unspecified: Secondary | ICD-10-CM

## 2024-01-22 ENCOUNTER — Other Ambulatory Visit (HOSPITAL_COMMUNITY): Payer: Self-pay

## 2024-01-22 ENCOUNTER — Ambulatory Visit: Payer: Self-pay | Admitting: Family Medicine

## 2024-01-22 ENCOUNTER — Other Ambulatory Visit: Payer: Self-pay

## 2024-01-22 MED ORDER — VITAMIN D (ERGOCALCIFEROL) 1.25 MG (50000 UNIT) PO CAPS
50000.0000 [IU] | ORAL_CAPSULE | ORAL | 12 refills | Status: AC
  Filled 2024-01-22: qty 12, 84d supply, fill #0
  Filled 2024-02-04: qty 7, 49d supply, fill #0

## 2024-01-22 MED ORDER — ALBUTEROL SULFATE HFA 108 (90 BASE) MCG/ACT IN AERS
2.0000 | INHALATION_SPRAY | Freq: Four times a day (QID) | RESPIRATORY_TRACT | 6 refills | Status: AC | PRN
  Filled 2024-01-22: qty 6.7, 25d supply, fill #0

## 2024-02-02 ENCOUNTER — Other Ambulatory Visit (HOSPITAL_COMMUNITY): Payer: Self-pay

## 2024-02-05 ENCOUNTER — Other Ambulatory Visit: Payer: Self-pay

## 2024-02-05 ENCOUNTER — Other Ambulatory Visit (HOSPITAL_COMMUNITY): Payer: Self-pay

## 2024-02-14 ENCOUNTER — Encounter: Payer: Self-pay | Admitting: Hematology and Oncology

## 2024-02-15 ENCOUNTER — Other Ambulatory Visit: Payer: Self-pay

## 2024-02-15 ENCOUNTER — Other Ambulatory Visit: Payer: Self-pay | Admitting: Family Medicine

## 2024-02-15 ENCOUNTER — Other Ambulatory Visit (HOSPITAL_COMMUNITY): Payer: Self-pay

## 2024-02-15 ENCOUNTER — Telehealth: Admitting: Physician Assistant

## 2024-02-15 DIAGNOSIS — B9689 Other specified bacterial agents as the cause of diseases classified elsewhere: Secondary | ICD-10-CM

## 2024-02-15 DIAGNOSIS — J4531 Mild persistent asthma with (acute) exacerbation: Secondary | ICD-10-CM

## 2024-02-15 DIAGNOSIS — J019 Acute sinusitis, unspecified: Secondary | ICD-10-CM | POA: Diagnosis not present

## 2024-02-15 MED ORDER — MELOXICAM 15 MG PO TABS
15.0000 mg | ORAL_TABLET | Freq: Every day | ORAL | 3 refills | Status: AC
  Filled 2024-02-15: qty 30, 30d supply, fill #0

## 2024-02-15 MED ORDER — DULOXETINE HCL 30 MG PO CPEP
30.0000 mg | ORAL_CAPSULE | Freq: Every day | ORAL | 1 refills | Status: AC
  Filled 2024-02-15: qty 90, 90d supply, fill #0

## 2024-02-15 MED ORDER — PREDNISONE 20 MG PO TABS
40.0000 mg | ORAL_TABLET | Freq: Every day | ORAL | 0 refills | Status: DC
  Filled 2024-02-15: qty 10, 5d supply, fill #0

## 2024-02-15 MED FILL — Amoxicillin & K Clavulanate Tab 875-125 MG: 1.0000 | ORAL | 7 days supply | Qty: 14 | Fill #0 | Status: AC

## 2024-02-15 NOTE — Progress Notes (Signed)
 E-Visit for Sinus Problems  We are sorry that you are not feeling well.  Here is how we plan to help!  Based on what you have shared with me it looks like you have sinusitis.  Sinusitis is inflammation and infection in the sinus cavities of the head.  Based on your presentation I believe you most likely have Acute Bacterial Sinusitis.  This is an infection caused by bacteria and is treated with antibiotics. I have prescribed Augmentin  875mg /125mg  one tablet twice daily with food, for 7 days. I have also prescribed Prednisone  20mg  Take 2 tablets (40mg ) daily for 5 days. You may use an oral decongestant such as Mucinex  D or if you have glaucoma or high blood pressure use plain Mucinex . Saline nasal spray help and can safely be used as often as needed for congestion.  If you develop worsening sinus pain, fever or notice severe headache and vision changes, or if symptoms are not better after completion of antibiotic, please schedule an appointment with a health care provider.    Sinus infections are not as easily transmitted as other respiratory infection, however we still recommend that you avoid close contact with loved ones, especially the very young and elderly.  Remember to wash your hands thoroughly throughout the day as this is the number one way to prevent the spread of infection!  Home Care: Only take medications as instructed by your medical team. Complete the entire course of an antibiotic. Do not take these medications with alcohol. A steam or ultrasonic humidifier can help congestion.  You can place a towel over your head and breathe in the steam from hot water coming from a faucet. Avoid close contacts especially the very young and the elderly. Cover your mouth when you cough or sneeze. Always remember to wash your hands.  Get Help Right Away If: You develop worsening fever or sinus pain. You develop a severe head ache or visual changes. Your symptoms persist after you have  completed your treatment plan.  Make sure you Understand these instructions. Will watch your condition. Will get help right away if you are not doing well or get worse.  Your e-visit answers were reviewed by a board certified advanced clinical practitioner to complete your personal care plan.  Depending on the condition, your plan could have included both over the counter or prescription medications.  If there is a problem please reply  once you have received a response from your provider.  Your safety is important to us .  If you have drug allergies check your prescription carefully.    You can use MyChart to ask questions about todays visit, request a non-urgent call back, or ask for a work or school excuse for 24 hours related to this e-Visit. If it has been greater than 24 hours you will need to follow up with your provider, or enter a new e-Visit to address those concerns.  You will get an e-mail in the next two days asking about your experience.  I hope that your e-visit has been valuable and will speed your recovery. Thank you for using e-visits.  I have spent 5 minutes in review of e-visit questionnaire, review and updating patient chart, medical decision making and response to patient.   Delon CHRISTELLA Dickinson, PA-C

## 2024-02-21 ENCOUNTER — Other Ambulatory Visit (HOSPITAL_COMMUNITY): Payer: Self-pay

## 2024-02-21 MED ORDER — PREDNISONE 20 MG PO TABS
40.0000 mg | ORAL_TABLET | Freq: Every day | ORAL | 0 refills | Status: AC
  Filled 2024-02-21: qty 20, 10d supply, fill #0

## 2024-03-03 ENCOUNTER — Ambulatory Visit
Admission: RE | Admit: 2024-03-03 | Discharge: 2024-03-03 | Disposition: A | Source: Ambulatory Visit | Attending: Hematology and Oncology

## 2024-03-03 DIAGNOSIS — Z1501 Genetic susceptibility to malignant neoplasm of breast: Secondary | ICD-10-CM

## 2024-03-03 MED ORDER — GADOPICLENOL 0.5 MMOL/ML IV SOLN
9.0000 mL | Freq: Once | INTRAVENOUS | Status: AC | PRN
  Administered 2024-03-03: 9 mL via INTRAVENOUS

## 2024-03-05 ENCOUNTER — Ambulatory Visit: Payer: Self-pay

## 2024-03-06 ENCOUNTER — Other Ambulatory Visit (HOSPITAL_COMMUNITY): Payer: Self-pay

## 2024-03-06 ENCOUNTER — Encounter: Payer: Self-pay | Admitting: Family Medicine

## 2024-03-06 MED ORDER — PHENTERMINE HCL 37.5 MG PO TABS
37.5000 mg | ORAL_TABLET | Freq: Every day | ORAL | 0 refills | Status: AC
  Filled 2024-03-06: qty 90, 90d supply, fill #0

## 2024-03-06 NOTE — Telephone Encounter (Signed)
 Patient just seen you 01/21/2024 and is questioning being able to start phentermine  to help lose weight and to help with asthma flaring?

## 2024-03-22 ENCOUNTER — Other Ambulatory Visit (HOSPITAL_COMMUNITY): Payer: Self-pay

## 2024-03-22 ENCOUNTER — Other Ambulatory Visit: Payer: Self-pay

## 2024-04-01 ENCOUNTER — Other Ambulatory Visit: Payer: Self-pay | Admitting: Family Medicine

## 2024-04-02 ENCOUNTER — Encounter (HOSPITAL_COMMUNITY): Payer: Self-pay | Admitting: Pharmacist

## 2024-04-02 ENCOUNTER — Other Ambulatory Visit (HOSPITAL_COMMUNITY): Payer: Self-pay

## 2024-04-02 MED ORDER — ROSUVASTATIN CALCIUM 10 MG PO TABS
10.0000 mg | ORAL_TABLET | Freq: Every day | ORAL | 1 refills | Status: AC
  Filled 2024-04-02: qty 90, 90d supply, fill #0

## 2024-05-20 ENCOUNTER — Ambulatory Visit: Admitting: Adult Health

## 2024-05-20 ENCOUNTER — Inpatient Hospital Stay: Admitting: Hematology and Oncology

## 2024-07-19 ENCOUNTER — Encounter: Admitting: Family Medicine
